# Patient Record
Sex: Female | Born: 1935
Health system: Southern US, Community
[De-identification: ages and names within clinical notes are randomized; demographics above are authoritative.]

## PROBLEM LIST (undated history)

## (undated) DIAGNOSIS — M542 Cervicalgia: Secondary | ICD-10-CM

## (undated) DIAGNOSIS — R519 Headache, unspecified: Secondary | ICD-10-CM

## (undated) DIAGNOSIS — I10 Essential (primary) hypertension: Secondary | ICD-10-CM

## (undated) DIAGNOSIS — I499 Cardiac arrhythmia, unspecified: Secondary | ICD-10-CM

## (undated) DIAGNOSIS — K219 Gastro-esophageal reflux disease without esophagitis: Secondary | ICD-10-CM

## (undated) DIAGNOSIS — M25512 Pain in left shoulder: Secondary | ICD-10-CM

## (undated) HISTORY — PX: TUBAL LIGATION: SHX77

## (undated) HISTORY — PX: ABDOMINAL HYSTERECTOMY: SHX81

---

## 1991-09-14 HISTORY — PX: TOTAL VAGINAL HYSTERECTOMY: SHX2548

## 2008-05-30 ENCOUNTER — Ambulatory Visit: Payer: Self-pay | Admitting: Internal Medicine

## 2010-12-07 ENCOUNTER — Ambulatory Visit: Payer: Self-pay | Admitting: Internal Medicine

## 2011-08-23 ENCOUNTER — Ambulatory Visit: Payer: Self-pay

## 2011-11-16 ENCOUNTER — Ambulatory Visit: Payer: Self-pay | Admitting: Internal Medicine

## 2011-11-29 ENCOUNTER — Ambulatory Visit: Payer: Self-pay | Admitting: Internal Medicine

## 2013-01-04 ENCOUNTER — Ambulatory Visit: Payer: Self-pay | Admitting: Internal Medicine

## 2013-01-04 LAB — TSH: TSH: 1.42 u[IU]/mL (ref <=5.90)

## 2013-03-15 ENCOUNTER — Ambulatory Visit: Payer: Self-pay | Admitting: Medical

## 2013-10-11 ENCOUNTER — Ambulatory Visit: Payer: Self-pay

## 2014-01-11 LAB — HM MAMMOGRAPHY: HM Mammogram: NORMAL

## 2014-01-22 ENCOUNTER — Ambulatory Visit: Payer: Self-pay | Admitting: Emergency Medicine

## 2014-01-22 LAB — COMPREHENSIVE METABOLIC PANEL
Albumin: 4.3 g/dL (ref 3.4–5.0)
Alkaline Phosphatase: 61 U/L
Anion Gap: 12 (ref 7–16)
BUN: 14 mg/dL (ref 7–18)
Bilirubin,Total: 0.4 mg/dL (ref 0.2–1.0)
Calcium, Total: 9 mg/dL (ref 8.5–10.1)
Chloride: 103 mmol/L (ref 98–107)
Co2: 26 mmol/L (ref 21–32)
Creatinine: 0.6 mg/dL (ref 0.60–1.30)
EGFR (African American): >60
EGFR (Non-African Amer.): >60
Glucose: 107 mg/dL — ABNORMAL HIGH (ref 65–99)
Osmolality: 282 (ref 275–301)
Potassium: 3.8 mmol/L (ref 3.5–5.1)
SGOT(AST): 24 U/L (ref 15–37)
SGPT (ALT): 28 U/L (ref 12–78)
Sodium: 141 mmol/L (ref 136–145)
Total Protein: 8.3 g/dL — ABNORMAL HIGH (ref 6.4–8.2)

## 2014-01-22 LAB — CBC WITH DIFFERENTIAL/PLATELET
Basophil #: 0 10*3/uL (ref 0.0–0.1)
Basophil %: 0.7 %
Eosinophil #: 0 10*3/uL (ref 0.0–0.7)
Eosinophil %: 0.5 %
HCT: 39.7 % (ref 35.0–47.0)
HGB: 13.4 g/dL (ref 12.0–16.0)
Lymphocyte #: 1.6 10*3/uL (ref 1.0–3.6)
Lymphocyte %: 27.6 %
MCH: 31.6 pg (ref 26.0–34.0)
MCHC: 33.8 g/dL (ref 32.0–36.0)
MCV: 93 fL (ref 80–100)
Monocyte #: 0.7 x10 3/mm (ref 0.2–0.9)
Monocyte %: 11.5 %
Neutrophil #: 3.6 10*3/uL (ref 1.4–6.5)
Neutrophil %: 59.7 %
Platelet: 314 10*3/uL (ref 150–440)
RBC: 4.25 10*6/uL (ref 3.80–5.20)
RDW: 13.1 % (ref 11.5–14.5)
WBC: 5.9 10*3/uL (ref 3.6–11.0)

## 2014-01-30 ENCOUNTER — Ambulatory Visit: Payer: Self-pay | Admitting: Internal Medicine

## 2014-05-11 ENCOUNTER — Ambulatory Visit: Payer: Self-pay | Admitting: Internal Medicine

## 2014-10-14 HISTORY — PX: ESOPHAGOGASTRODUODENOSCOPY (EGD) WITH ESOPHAGEAL DILATION: SHX5812

## 2014-10-17 ENCOUNTER — Ambulatory Visit: Payer: Self-pay | Admitting: Physician Assistant

## 2014-11-11 ENCOUNTER — Ambulatory Visit: Payer: Self-pay | Admitting: Gastroenterology

## 2014-12-04 ENCOUNTER — Ambulatory Visit: Payer: Self-pay | Admitting: Family Medicine

## 2014-12-04 LAB — URINALYSIS, COMPLETE
Bilirubin,UR: NEGATIVE
Glucose,UR: NEGATIVE
Ketone: NEGATIVE
LEUKOCYTE ESTERASE: NEGATIVE
NITRITE: NEGATIVE
PH: 8.5 (ref 5.0–8.0)
Protein: NEGATIVE
Specific Gravity: 1.02 (ref 1.000–1.030)
Squamous Epithelial: NONE SEEN

## 2014-12-06 LAB — URINE CULTURE

## 2015-01-02 ENCOUNTER — Ambulatory Visit
Admit: 2015-01-02 | Disposition: A | Discharge status: Home / Self Care | Payer: Self-pay | Attending: Family Medicine | Admitting: Family Medicine

## 2015-01-21 ENCOUNTER — Encounter: Payer: Self-pay | Admitting: Internal Medicine

## 2015-01-21 DIAGNOSIS — D171 Benign lipomatous neoplasm of skin and subcutaneous tissue of trunk: Secondary | ICD-10-CM | POA: Insufficient documentation

## 2015-01-21 DIAGNOSIS — M2040 Other hammer toe(s) (acquired), unspecified foot: Secondary | ICD-10-CM | POA: Insufficient documentation

## 2015-01-21 DIAGNOSIS — D179 Benign lipomatous neoplasm, unspecified: Secondary | ICD-10-CM | POA: Insufficient documentation

## 2015-01-21 DIAGNOSIS — K222 Esophageal obstruction: Secondary | ICD-10-CM | POA: Insufficient documentation

## 2015-01-21 DIAGNOSIS — S39012A Strain of muscle, fascia and tendon of lower back, initial encounter: Secondary | ICD-10-CM | POA: Insufficient documentation

## 2015-01-21 DIAGNOSIS — R1312 Dysphagia, oropharyngeal phase: Secondary | ICD-10-CM | POA: Insufficient documentation

## 2015-01-21 DIAGNOSIS — I10 Essential (primary) hypertension: Secondary | ICD-10-CM | POA: Insufficient documentation

## 2015-03-09 ENCOUNTER — Ambulatory Visit
Admission: EM | Admit: 2015-03-09 | Discharge: 2015-03-09 | Disposition: A | Discharge status: Home / Self Care | Payer: Medicare Other | Attending: Internal Medicine | Admitting: Internal Medicine

## 2015-03-09 ENCOUNTER — Encounter: Payer: Self-pay | Admitting: Emergency Medicine

## 2015-03-09 DIAGNOSIS — M1611 Unilateral primary osteoarthritis, right hip: Secondary | ICD-10-CM

## 2015-03-09 HISTORY — DX: Gastro-esophageal reflux disease without esophagitis: K21.9

## 2015-03-09 HISTORY — DX: Essential (primary) hypertension: I10

## 2015-03-09 MED ORDER — MELOXICAM 15 MG PO TABS: 15.0000 mg | ORAL_TABLET | Freq: Every day | ORAL | Status: DC

## 2015-03-09 NOTE — ED Notes (Signed)
Left hip pain since 2014. Recently seen doctor in May and Dr stated she had a pulled muscle and was put on Naproxen.  Pain is no better

## 2015-03-09 NOTE — Discharge Instructions (Signed)

## 2015-03-09 NOTE — ED Provider Notes (Signed)
CSN: [id number]     Arrival date & time 03/09/15  1515 History   First MD Initiated Contact with Patient 03/09/15 1516     Chief Complaint  Patient presents with  . Hip Pain   HPI Comments: On naproxen by her PCP but not helping much  Patient is a 80 y.o. female presenting with hip pain. The history is provided by the patient and a significant other. No language interpreter was used.  Hip Pain This is a chronic problem. The current episode started more than 1 week ago. The problem occurs constantly. The problem has been gradually worsening. Pertinent negatives include no chest pain, no abdominal pain, no headaches and no shortness of breath. The symptoms are aggravated by bending, twisting and walking. The symptoms are relieved by rest and sleep. She has tried acetaminophen for the symptoms. The treatment provided mild relief.   Past Medical History  Diagnosis Date  . Hypertension   . GERD (gastroesophageal reflux disease)    Past Surgical History  Procedure Laterality Date  . Total vaginal hysterectomy  1993  . Esophagogastroduodenoscopy (egd) with esophageal dilation  10/2014    Dr. Allen Norris  . Abdominal hysterectomy     Family History  Problem Relation Age of Onset  . Breast cancer Daughter    History  Substance Use Topics  . Smoking status: Never Smoker   . Smokeless tobacco: Never Used  . Alcohol Use: 0.0 oz/week    0 Standard drinks or equivalent per week   OB History    No data available     Review of Systems  HENT: Negative for congestion, dental problem, ear discharge, ear pain, nosebleeds, sinus pressure, sore throat and trouble swallowing.   Eyes: Negative for pain, discharge and redness.  Respiratory: Negative for cough, chest tightness, shortness of breath and wheezing.   Cardiovascular: Negative for chest pain and palpitations.  Gastrointestinal: Negative for nausea, vomiting, abdominal pain, diarrhea, constipation and abdominal distention.  Endocrine: Negative  for cold intolerance and heat intolerance.  Genitourinary: Negative for hematuria, flank pain and difficulty urinating.  Musculoskeletal: Positive for gait problem (lt hip pain). Negative for joint swelling, arthralgias, neck pain and neck stiffness.  Skin: Negative for color change, rash and wound.  Neurological: Negative for dizziness, tremors, seizures, syncope, speech difficulty, weakness, light-headedness, numbness and headaches.  Hematological: Does not bruise/bleed easily.  Psychiatric/Behavioral: Negative for behavioral problems, confusion, sleep disturbance and agitation. The patient is not nervous/anxious and is not hyperactive.     Allergies  Review of patient's allergies indicates no known allergies.  Home Medications   Prior to Admission medications   Medication Sig Start Date End Date Taking? Authorizing Provider  amLODipine (NORVASC) 10 MG tablet Take 1 tablet by mouth daily. 01/17/15  Yes Historical Provider, MD  Naproxen 375 MG TBEC Take 1 tablet by mouth 2 (two) times daily. 01/17/15  Yes Historical Provider, MD  Omeprazole 20 MG TBEC Take 1 tablet by mouth daily. 01/17/15  Yes Historical Provider, MD   BP 179/77 mmHg  Pulse 90  Temp(Src) 97.3 F (36.3 C) (Tympanic)  Ht 5\' 7"  (1.702 m)  Wt 139 lb (63.05 kg)  BMI 21.77 kg/m2  SpO2 99% Physical Exam  Constitutional: She is oriented to person, place, and time. She appears well-developed and well-nourished.  HENT:  Head: Normocephalic and atraumatic.  Eyes: EOM are normal. Pupils are equal, round, and reactive to light.  Neck: Normal range of motion. Neck supple.  Cardiovascular: Normal rate, regular rhythm and  normal heart sounds.  Exam reveals no gallop and no friction rub.   No murmur heard. Pulmonary/Chest: Effort normal and breath sounds normal. No respiratory distress. She has no wheezes. She has no rales. She exhibits no tenderness.  Abdominal: Soft. Bowel sounds are normal. She exhibits no distension and no mass.  There is no tenderness. There is no rebound and no guarding.  Musculoskeletal: Normal range of motion. She exhibits no edema.       Right hip: She exhibits tenderness.       Legs: Deep tenderness  Neurological: She is oriented to person, place, and time. No cranial nerve deficit.  Skin: Skin is warm and dry.  Psychiatric: She has a normal mood and affect. Her behavior is normal. Judgment and thought content normal.  Nursing note and vitals reviewed.  ED Course  Procedures (including critical care time) Labs Review Labs Reviewed - No data to display  Imaging Review No results found.   MDM  No diagnosis found. Hip Osteoarthritis: will change it to meloxicam and recommend ortho f/up - needs hip x-ray and may be joint injection.    Max Sane, MD 03/09/15 1600

## 2015-12-26 ENCOUNTER — Other Ambulatory Visit: Payer: Self-pay | Admitting: Internal Medicine

## 2015-12-29 NOTE — Telephone Encounter (Signed)
pts coming in on 7/26 @ 930

## 2016-03-20 ENCOUNTER — Ambulatory Visit
Admission: EM | Admit: 2016-03-20 | Discharge: 2016-03-20 | Disposition: A | Discharge status: Other Acute Inpatient Hospital | Payer: Medicaid Other | Attending: Family Medicine | Admitting: Family Medicine

## 2016-03-20 ENCOUNTER — Emergency Department
Admission: EM | Admit: 2016-03-20 | Discharge: 2016-03-20 | Disposition: A | Discharge status: Home / Self Care | Payer: Medicare Other | Attending: Emergency Medicine | Admitting: Emergency Medicine

## 2016-03-20 ENCOUNTER — Emergency Department: Payer: Medicare Other

## 2016-03-20 ENCOUNTER — Encounter: Payer: Self-pay | Admitting: Emergency Medicine

## 2016-03-20 DIAGNOSIS — Z7951 Long term (current) use of inhaled steroids: Secondary | ICD-10-CM | POA: Diagnosis not present

## 2016-03-20 DIAGNOSIS — R531 Weakness: Secondary | ICD-10-CM | POA: Insufficient documentation

## 2016-03-20 DIAGNOSIS — Z791 Long term (current) use of non-steroidal anti-inflammatories (NSAID): Secondary | ICD-10-CM | POA: Insufficient documentation

## 2016-03-20 DIAGNOSIS — R000 Tachycardia, unspecified: Secondary | ICD-10-CM | POA: Insufficient documentation

## 2016-03-20 DIAGNOSIS — Z79899 Other long term (current) drug therapy: Secondary | ICD-10-CM | POA: Insufficient documentation

## 2016-03-20 DIAGNOSIS — K219 Gastro-esophageal reflux disease without esophagitis: Secondary | ICD-10-CM | POA: Diagnosis not present

## 2016-03-20 DIAGNOSIS — R9431 Abnormal electrocardiogram [ECG] [EKG]: Secondary | ICD-10-CM | POA: Diagnosis not present

## 2016-03-20 DIAGNOSIS — R5383 Other fatigue: Secondary | ICD-10-CM | POA: Insufficient documentation

## 2016-03-20 DIAGNOSIS — Z8719 Personal history of other diseases of the digestive system: Secondary | ICD-10-CM | POA: Diagnosis not present

## 2016-03-20 DIAGNOSIS — I10 Essential (primary) hypertension: Secondary | ICD-10-CM | POA: Diagnosis not present

## 2016-03-20 DIAGNOSIS — R6889 Other general symptoms and signs: Secondary | ICD-10-CM | POA: Diagnosis not present

## 2016-03-20 LAB — URINALYSIS COMPLETE WITH MICROSCOPIC (ARMC ONLY)
BACTERIA UA: NONE SEEN
BILIRUBIN URINE: NEGATIVE
BILIRUBIN URINE: NEGATIVE
GLUCOSE, UA: NEGATIVE mg/dL
Glucose, UA: NEGATIVE mg/dL
KETONES UR: NEGATIVE mg/dL
Ketones, ur: NEGATIVE mg/dL
LEUKOCYTES UA: NEGATIVE
LEUKOCYTES UA: NEGATIVE
NITRITE: NEGATIVE
NITRITE: NEGATIVE
PH: 7 (ref 5.0–8.0)
Protein, ur: NEGATIVE mg/dL
Protein, ur: NEGATIVE mg/dL
SPECIFIC GRAVITY, URINE: 1.005 (ref 1.005–1.030)
Specific Gravity, Urine: 1.015 (ref 1.005–1.030)
Squamous Epithelial / LPF: NONE SEEN
WBC UA: NONE SEEN WBC/hpf (ref 0–5)
pH: 7 (ref 5.0–8.0)

## 2016-03-20 LAB — TSH: TSH: 0.774 u[IU]/mL (ref 0.350–4.500)

## 2016-03-20 LAB — COMPREHENSIVE METABOLIC PANEL
ALT: 19 U/L (ref 14–54)
ANION GAP: 11 (ref 5–15)
AST: 27 U/L (ref 15–41)
Albumin: 4.4 g/dL (ref 3.5–5.0)
Alkaline Phosphatase: 56 U/L (ref 38–126)
BUN: 11 mg/dL (ref 6–20)
CALCIUM: 9.2 mg/dL (ref 8.9–10.3)
CHLORIDE: 107 mmol/L (ref 101–111)
CO2: 24 mmol/L (ref 22–32)
CREATININE: 0.54 mg/dL (ref 0.44–1.00)
Glucose, Bld: 113 mg/dL — ABNORMAL HIGH (ref 65–99)
POTASSIUM: 3.4 mmol/L — AB (ref 3.5–5.1)
Sodium: 142 mmol/L (ref 135–145)
TOTAL PROTEIN: 7.6 g/dL (ref 6.5–8.1)
Total Bilirubin: 0.4 mg/dL (ref 0.3–1.2)

## 2016-03-20 LAB — TROPONIN I

## 2016-03-20 LAB — DIFFERENTIAL
Basophils Absolute: 0 10*3/uL (ref 0–0.1)
Basophils Relative: 1 %
EOS PCT: 1 %
Eosinophils Absolute: 0.1 10*3/uL (ref 0–0.7)
LYMPHS ABS: 1.9 10*3/uL (ref 1.0–3.6)
LYMPHS PCT: 30 %
MONO ABS: 0.8 10*3/uL (ref 0.2–0.9)
MONOS PCT: 13 %
Neutro Abs: 3.5 10*3/uL (ref 1.4–6.5)
Neutrophils Relative %: 55 %

## 2016-03-20 LAB — CBC
HCT: 42.8 % (ref 35.0–47.0)
Hemoglobin: 14.5 g/dL (ref 12.0–16.0)
MCH: 31.5 pg (ref 26.0–34.0)
MCHC: 33.7 g/dL (ref 32.0–36.0)
MCV: 93.5 fL (ref 80.0–100.0)
PLATELETS: 324 10*3/uL (ref 150–440)
RBC: 4.58 MIL/uL (ref 3.80–5.20)
RDW: 13 % (ref 11.5–14.5)
WBC: 6.1 10*3/uL (ref 3.6–11.0)

## 2016-03-20 LAB — LACTIC ACID, PLASMA: Lactic Acid, Venous: 1 mmol/L (ref 0.5–1.9)

## 2016-03-20 LAB — BRAIN NATRIURETIC PEPTIDE: B Natriuretic Peptide: 32 pg/mL (ref 0.0–100.0)

## 2016-03-20 MED ORDER — ASPIRIN 81 MG PO CHEW
324.0000 mg | CHEWABLE_TABLET | Freq: Once | ORAL | Status: AC
  Administered 2016-03-20: 324 mg via ORAL

## 2016-03-20 NOTE — ED Notes (Signed)
Patient complains of feeling tired/ run down for the last three weeks. Patient states that she has no other complaints. Patient denies recent sickness, tick bite, or chest pain.

## 2016-03-20 NOTE — ED Provider Notes (Signed)
[facility] Emergency Department Provider Note   ____________________________________________  Time seen: Approximately 2:14 PM  I have reviewed the triage vital signs and the nursing notes.   HISTORY  Chief Complaint Weakness    HPI Kendra Lane is a 81 y.o. female patient sent from urgent care with 2 or 3 week history of weakness. Patient denies nausea vomiting shortness of breath chest pain fever chills diarrhea or any other complaints she has no urgency frequency or dysuria. She does not feel depressed. Patient had frequent premature atrial contractions on EKG.   Past Medical History  Diagnosis Date  . Hypertension   . GERD (gastroesophageal reflux disease)     Patient Active Problem List   Diagnosis Date Noted  . Benign esophageal stricture 01/21/2015  . Essential (primary) hypertension 01/21/2015  . Hammer toe 01/21/2015  . Fatty tumor 01/21/2015  . Dysphagia, oropharyngeal 01/21/2015  . Low back strain 01/21/2015    Past Surgical History  Procedure Laterality Date  . Total vaginal hysterectomy  1993  . Esophagogastroduodenoscopy (egd) with esophageal dilation  10/2014    Dr. Allen Norris  . Abdominal hysterectomy      Current Outpatient Rx  Name  Route  Sig  Dispense  Refill  . amLODipine (NORVASC) 10 MG tablet      TAKE 1 TABLET BY MOUTH DAILY   90 tablet   0   . fluticasone (FLONASE) 50 MCG/ACT nasal spray      SHAKE WELL AND USE 2 SPRAYS IN EACH NOSTRIL EVERY DAY   16 g   0   . meloxicam (MOBIC) 15 MG tablet   Oral   Take 1 tablet (15 mg total) by mouth daily.   30 tablet   0   . Naproxen 375 MG TBEC   Oral   Take 1 tablet by mouth 2 (two) times daily.         Marland Kitchen omeprazole (PRILOSEC) 20 MG capsule      TAKE ONE CAPSULE BY MOUTH EVERY DAY   90 capsule   0     Allergies Review of patient's allergies indicates no known allergies.  Family History  Problem Relation Age of Onset  . Breast cancer Daughter      Social History Social History  Substance Use Topics  . Smoking status: Never Smoker   . Smokeless tobacco: Never Used  . Alcohol Use: 0.0 oz/week    0 Standard drinks or equivalent per week     Comment: 1 beer daily    Review of Systems Constitutional: No fever/chills Eyes: No visual changes. ENT: No sore throat. Cardiovascular: Denies chest pain. Respiratory: Denies shortness of breath. Gastrointestinal: No abdominal pain.  No nausea, no vomiting.  No diarrhea.  No constipation. Genitourinary: Negative for dysuria. Musculoskeletal: Negative for back pain. Skin: Negative for rash. Neurological: Negative for headaches, focal weakness or numbness.  10-point ROS otherwise negative.  ____________________________________________   PHYSICAL EXAM:  VITAL SIGNS: ED Triage Vitals  Enc Vitals Group     BP 03/20/16 1340 172/92 mmHg     Pulse Rate 03/20/16 1340 107     Resp 03/20/16 1340 16     Temp 03/20/16 1340 97.9 F (36.6 C)     Temp Source 03/20/16 1340 Oral     SpO2 03/20/16 1340 100 %     Weight 03/20/16 1340 138 lb (62.596 kg)     Height 03/20/16 1340 5\' 5"  (1.651 m)     Head Cir --  Peak Flow --      Pain Score 03/20/16 1343 0     Pain Loc --      Pain Edu? --      Excl. in [place]? --    Constitutional: Alert and oriented. Well appearing and in no acute distress. Eyes: Conjunctivae are normal. PERRL. EOMI. Head: Atraumatic. Nose: No congestion/rhinnorhea. Mouth/Throat: Mucous membranes are moist.  Oropharynx non-erythematous. Neck: No stridor.  Cardiovascular: Tachycardia at about 106, regular rhythm. Grossly normal heart sounds.  Good peripheral circulation. Respiratory: Normal respiratory effort.  No retractions. Lungs CTAB. Gastrointestinal: Soft and nontender. No distention. No abdominal bruits. No CVA tenderness. Musculoskeletal: No lower extremity tenderness nor edema.  No joint effusions. Neurologic:  Normal speech and language. No gross focal  neurologic deficits are appreciated. No gait instability. Skin:  Skin is warm, dry and intact. No rash noted. Psychiatric: Mood and affect are normal. Speech and behavior are normal.  ____________________________________________   LABS (all labs ordered are listed, but only abnormal results are displayed)  Labs Reviewed  COMPREHENSIVE METABOLIC PANEL - Abnormal; Notable for the following:    Potassium 3.4 (*)    Glucose, Bld 113 (*)    All other components within normal limits  URINALYSIS COMPLETEWITH MICROSCOPIC ([facility] ONLY) - Abnormal; Notable for the following:    Color, Urine COLORLESS (*)    APPearance CLEAR (*)    Hgb urine dipstick 1+ (*)    Bacteria, UA RARE (*)    All other components within normal limits  TROPONIN I  CBC  BRAIN NATRIURETIC PEPTIDE  LACTIC ACID, PLASMA  TSH  DIFFERENTIAL  LACTIC ACID, PLASMA  CBC WITH DIFFERENTIAL/PLATELET   ____________________________________________  EKG  EKG read and interpreted by me shows sinus rate at a rate of 93 normal axis no acute ST-T wave changes there are occasional PACs on the EKG I should add that the monitor showed almost bigeminy with the PACs ____________________________________________  RADIOLOGY  No acute changes changes per radiology ____________________________________________   PROCEDURES   Procedures    ____________________________________________   INITIAL IMPRESSION / ASSESSMENT AND PLAN / ED COURSE  Pertinent labs & imaging results that were available during my care of the patient were reviewed by me and considered in my medical decision making (see chart for details).   ____________________________________________   FINAL CLINICAL IMPRESSION(S) / ED DIAGNOSES  Final diagnoses:  Weakness      NEW MEDICATIONS STARTED DURING THIS VISIT:  New Prescriptions   No medications on file     Note:  This document was prepared using Dragon voice recognition software and may include  unintentional dictation errors.    Nena Polio, MD 03/20/16 1630

## 2016-03-20 NOTE — Discharge Instructions (Signed)
Weakness Weakness is a lack of strength. It may be felt all over the body (generalized) or in one specific part of the body (focal). Some causes of weakness can be serious. You may need further medical evaluation, especially if you are elderly or you have a history of immunosuppression (such as chemotherapy or HIV), kidney disease, heart disease, or diabetes. CAUSES  Weakness can be caused by many different things, including:  Infection.  Physical exhaustion.  Internal bleeding or other blood loss that results in a lack of red blood cells (anemia).  Dehydration. This cause is more common in elderly people.  Side effects or electrolyte abnormalities from medicines, such as pain medicines or sedatives.  Emotional distress, anxiety, or depression.  Circulation problems, especially severe peripheral arterial disease.  Heart disease, such as rapid atrial fibrillation, bradycardia, or heart failure.  Nervous system disorders, such as Guillain-Barr syndrome, multiple sclerosis, or stroke. DIAGNOSIS  To find the cause of your weakness, your caregiver will take your history and perform a physical exam. Lab tests or X-rays may also be ordered, if needed. TREATMENT  Treatment of weakness depends on the cause of your symptoms and can vary greatly. HOME CARE INSTRUCTIONS   Rest as needed.  Eat a well-balanced diet.  Try to get some exercise every day.  Only take over-the-counter or prescription medicines as directed by your caregiver. SEEK MEDICAL CARE IF:   Your weakness seems to be getting worse or spreads to other parts of your body.  You develop new aches or pains. SEEK IMMEDIATE MEDICAL CARE IF:   You cannot perform your normal daily activities, such as getting dressed and feeding yourself.  You cannot walk up and down stairs, or you feel exhausted when you do so.  You have shortness of breath or chest pain.  You have difficulty moving parts of your body.  You have weakness  in only one area of the body or on only one side of the body.  You have a fever.  You have trouble speaking or swallowing.  You cannot control your bladder or bowel movements.  You have black or bloody vomit or stools. MAKE SURE YOU:  Understand these instructions.  Will watch your condition.  Will get help right away if you are not doing well or get worse.   This information is not intended to replace advice given to you by your health care provider. Make sure you discuss any questions you have with your health care provider.   Document Released: 08/30/2005 Document Revised: 02/29/2012 Document Reviewed: 10/29/2011 Elsevier Interactive Patient Education [employer]. Use return if you're worse or develop any new symptoms. Please call your doctor schedule follow-up appointment on Monday.

## 2016-03-20 NOTE — ED Notes (Addendum)
Patient arrives to [facility] with complaint of weakness for two weeks. Denies injury or insect bite.

## 2016-03-20 NOTE — ED Provider Notes (Addendum)
CSN: [id number]     Arrival date & time 03/20/16  1108 History   First MD Initiated Contact with Patient 03/20/16 1143     Chief Complaint  Patient presents with  . Fatigue   (Consider location/radiation/quality/duration/timing/severity/associated sxs/prior Treatment) HPI: Patient presents today with generalized symptoms of fatigue. Patient states that she has been feeling this way for the last 3 weeks. She denies any other symptoms. She denies fever, chest pain, shortness of breath, headache, lower extremity edema or pain, abdominal pain, nausea, vomiting, diarrhea, dizziness, rash, urinary symptoms. She denies any recent travel. She denies any known cardiac problems besides hypertension. She has not started to take any new medications recently. She denies smoking or any other illicit drug use.  Past Medical History  Diagnosis Date  . Hypertension   . GERD (gastroesophageal reflux disease)    Past Surgical History  Procedure Laterality Date  . Total vaginal hysterectomy  1993  . Esophagogastroduodenoscopy (egd) with esophageal dilation  10/2014    Dr. Allen Norris  . Abdominal hysterectomy     Family History  Problem Relation Age of Onset  . Breast cancer Daughter    Social History  Substance Use Topics  . Smoking status: Never Smoker   . Smokeless tobacco: Never Used  . Alcohol Use: 0.0 oz/week    0 Standard drinks or equivalent per week     Comment: 1 beer daily   OB History    No data available     Review of Systems: Negative except mentioned above.  Allergies  Review of patient's allergies indicates no known allergies.  Home Medications   Prior to Admission medications   Medication Sig Start Date End Date Taking? Authorizing Provider  amLODipine (NORVASC) 10 MG tablet TAKE 1 TABLET BY MOUTH DAILY 12/26/15  Yes Glean Hess, MD  fluticasone [facility]) 50 MCG/ACT nasal spray SHAKE WELL AND USE 2 SPRAYS IN EACH NOSTRIL EVERY DAY 12/26/15  Yes Glean Hess, MD  omeprazole  (PRILOSEC) 20 MG capsule TAKE ONE CAPSULE BY MOUTH EVERY DAY 12/26/15  Yes Glean Hess, MD  meloxicam (MOBIC) 15 MG tablet Take 1 tablet (15 mg total) by mouth daily. 03/09/15   Max Sane, MD  Naproxen 375 MG TBEC Take 1 tablet by mouth 2 (two) times daily. 01/17/15   Historical Provider, MD   Meds Ordered and Administered this Visit  Medications - No data to display  BP 159/73 mmHg  Pulse 44  Temp(Src) 98.2 F (36.8 C) (Oral)  Resp 17  Ht 5\' 6"  (1.676 m)  Wt 136 lb (61.689 kg)  BMI 21.96 kg/m2  SpO2 100% No data found.   Physical Exam   GENERAL: NAD HEENT: no pharyngeal erythema, no exudate RESP: CTA B CARD: irregular rate and rhythm  ABD: +BS, NT  EXTREM: -[place] NEURO: AAO, CN II-XII grossly intact   ED Course  Procedures (including critical care time)  Labs Review Labs Reviewed  URINALYSIS COMPLETEWITH MICROSCOPIC ([place]) - Abnormal; Notable for the following:    Hgb urine dipstick TRACE (*)    Squamous Epithelial / LPF 0-5 (*)    All other components within normal limits    Imaging Review No results found.  ECG- HR 93, undetermined rhythm, nonspecific ST abnormaility, p waves appreciated, agree with print out reading of ECG  Previous ECG 2012 - NSR, no other abnormalities   MDM  A/P: Generalized fatigue, abnormal EKG- patient does not have a regular rate and rhythm on exam, ?PACs. I would  recommend further evaluation and treatment at the ER given patient's age and symptoms. Patient was given 4 baby aspirin here in the office, IV Hep-Lock, 2 L O2 nasal cannula. EMS to take patient to ER for further evaluation and treatment.    Paulina Fusi, MD 03/20/16 [id number]  Paulina Fusi, MD 03/20/16 [id number]  Paulina Fusi, MD 03/22/16 1125

## 2016-03-20 NOTE — ED Notes (Signed)
Signature pad not working.  Pt verbalized understanding of discharge instructions and has no further questions.

## 2016-03-29 ENCOUNTER — Other Ambulatory Visit: Payer: Self-pay | Admitting: Internal Medicine

## 2016-04-07 ENCOUNTER — Ambulatory Visit (INDEPENDENT_AMBULATORY_CARE_PROVIDER_SITE_OTHER): Payer: Medicare Other | Admitting: Internal Medicine

## 2016-04-07 ENCOUNTER — Encounter: Payer: Self-pay | Admitting: Internal Medicine

## 2016-04-07 VITALS — BP 142/80 | HR 94 | Resp 16 | Ht 68.25 in | Wt 133.8 lb

## 2016-04-07 DIAGNOSIS — Z23 Encounter for immunization: Secondary | ICD-10-CM

## 2016-04-07 DIAGNOSIS — Z1231 Encounter for screening mammogram for malignant neoplasm of breast: Secondary | ICD-10-CM

## 2016-04-07 DIAGNOSIS — Z0000 Encounter for general adult medical examination without abnormal findings: Secondary | ICD-10-CM

## 2016-04-07 DIAGNOSIS — I10 Essential (primary) hypertension: Secondary | ICD-10-CM | POA: Diagnosis not present

## 2016-04-07 DIAGNOSIS — I499 Cardiac arrhythmia, unspecified: Secondary | ICD-10-CM | POA: Diagnosis not present

## 2016-04-07 DIAGNOSIS — K222 Esophageal obstruction: Secondary | ICD-10-CM | POA: Diagnosis not present

## 2016-04-07 DIAGNOSIS — I498 Other specified cardiac arrhythmias: Secondary | ICD-10-CM | POA: Insufficient documentation

## 2016-04-07 LAB — POCT URINALYSIS DIPSTICK
BILIRUBIN UA: NEGATIVE
Blood, UA: NEGATIVE
Glucose, UA: NEGATIVE
KETONES UA: NEGATIVE
LEUKOCYTES UA: NEGATIVE
Nitrite, UA: NEGATIVE
PH UA: 7
PROTEIN UA: NEGATIVE
SPEC GRAV UA: 1.01

## 2016-04-07 MED ORDER — AMLODIPINE BESYLATE 10 MG PO TABS: 10.0000 mg | ORAL_TABLET | Freq: Every day | ORAL | 3 refills | Status: DC

## 2016-04-07 MED ORDER — OMEPRAZOLE 20 MG PO CPDR: 20.0000 mg | DELAYED_RELEASE_CAPSULE | Freq: Every day | ORAL | 3 refills | Status: DC

## 2016-04-07 NOTE — Patient Instructions (Addendum)
Health Maintenance  Topic Date Due  . ZOSTAVAX  08/25/1997  . DEXA SCAN  08/25/2002  . PNA vac Low Risk Adult (2 of 2 - PCV13) 06/26/2013  . MAMMOGRAM  01/12/2015  . TETANUS/TDAP  09/13/2017 (Originally 08/25/1954)  . INFLUENZA VACCINE  04/13/2016    Breast Self-Awareness Practicing breast self-awareness may pick up problems early, prevent significant medical complications, and possibly save your life. By practicing breast self-awareness, you can become familiar with how your breasts look and feel and if your breasts are changing. This allows you to notice changes early. It can also offer you some reassurance that your breast health is good. One way to learn what is normal for your breasts and whether your breasts are changing is to do a breast self-exam. If you find a lump or something that was not present in the past, it is best to contact your caregiver right away. Other findings that should be evaluated by your caregiver include nipple discharge, especially if it is bloody; skin changes or reddening; areas where the skin seems to be pulled in (retracted); or new lumps and bumps. Breast pain is seldom associated with cancer (malignancy), but should also be evaluated by a caregiver. HOW TO PERFORM A BREAST SELF-EXAM The best time to examine your breasts is 5-7 days after your menstrual period is over. During menstruation, the breasts are lumpier, and it may be more difficult to pick up changes. If you do not menstruate, have reached menopause, or had your uterus removed (hysterectomy), you should examine your breasts at regular intervals, such as monthly. If you are breastfeeding, examine your breasts after a feeding or after using a breast pump. Breast implants do not decrease the risk for lumps or tumors, so continue to perform breast self-exams as recommended. Talk to your caregiver about how to determine the difference between the implant and breast tissue. Also, talk about the amount of pressure  you should use during the exam. Over time, you will become more familiar with the variations of your breasts and more comfortable with the exam. A breast self-exam requires you to remove all your clothes above the waist. 1. Look at your breasts and nipples. Stand in front of a mirror in a room with good lighting. With your hands on your hips, push your hands firmly downward. Look for a difference in shape, contour, and size from one breast to the other (asymmetry). Asymmetry includes puckers, dips, or bumps. Also, look for skin changes, such as reddened or scaly areas on the breasts. Look for nipple changes, such as discharge, dimpling, repositioning, or redness. 2. Carefully feel your breasts. This is best done either in the shower or tub while using soapy water or when flat on your back. Place the arm (on the side of the breast you are examining) above your head. Use the pads (not the fingertips) of your three middle fingers on your opposite hand to feel your breasts. Start in the underarm area and use  inch (2 cm) overlapping circles to feel your breast. Use 3 different levels of pressure (light, medium, and firm pressure) at each circle before moving to the next circle. The light pressure is needed to feel the tissue closest to the skin. The medium pressure will help to feel breast tissue a little deeper, while the firm pressure is needed to feel the tissue close to the ribs. Continue the overlapping circles, moving downward over the breast until you feel your ribs below your breast. Then, move one  finger-width towards the center of the body. Continue to use the  inch (2 cm) overlapping circles to feel your breast as you move slowly up toward the collar bone (clavicle) near the base of the neck. Continue the up and down exam using all 3 pressures until you reach the middle of the chest. Do this with each breast, carefully feeling for lumps or changes. 3.  Keep a written record with breast changes or normal  findings for each breast. By writing this information down, you do not need to depend only on memory for size, tenderness, or location. Write down where you are in your menstrual cycle, if you are still menstruating. Breast tissue can have some lumps or thick tissue. However, see your caregiver if you find anything that concerns you.  SEEK MEDICAL CARE IF:  You see a change in shape, contour, or size of your breasts or nipples.   You see skin changes, such as reddened or scaly areas on the breasts or nipples.   You have an unusual discharge from your nipples.   You feel a new lump or unusually thick areas.    This information is not intended to replace advice given to you by your health care provider. Make sure you discuss any questions you have with your health care provider.   Document Released: 08/30/2005 Document Revised: 08/16/2012 Document Reviewed: 12/15/2011 Elsevier Interactive Patient Education 2016 Elsevier Inc. Pneumococcal Conjugate Vaccine (PCV13)  1. Why get vaccinated? Vaccination can protect both children and adults from pneumococcal disease. Pneumococcal disease is caused by bacteria that can spread from person to person through close contact. It can cause ear infections, and it can also lead to more serious infections of the:  Lungs (pneumonia),  Blood (bacteremia), and  Covering of the brain and spinal cord (meningitis). Pneumococcal pneumonia is most common among adults. Pneumococcal meningitis can cause deafness and brain damage, and it kills about 1 child in 10 who get it. Anyone can get pneumococcal disease, but children under 22 years of age and adults 68 years and older, people with certain medical conditions, and cigarette smokers are at the highest risk. Before there was a vaccine, the [place] saw:  more than 700 cases of meningitis,  about 13,000 blood infections,  about 5 million ear infections, and  about 200 deaths in children under 5  each year from pneumococcal disease. Since vaccine became available, severe pneumococcal disease in these children has fallen by 88%. About 18,000 older adults die of pneumococcal disease each year in the [place]. Treatment of pneumococcal infections with penicillin and other drugs is not as effective as it used to be, because some strains of the disease have become resistant to these drugs. This makes prevention of the disease, through vaccination, even more important. 2. PCV13 vaccine Pneumococcal conjugate vaccine (called PCV13) protects against 13 types of pneumococcal bacteria. PCV13 is routinely given to children at 2, 4, 6, and 13-66 months of age. It is also recommended for children and adults 28 to 25 years of age with certain health conditions, and for all adults 48 years of age and older. Your doctor can give you details. 3. Some people should not get this vaccine Anyone who has ever had a life-threatening allergic reaction to a dose of this vaccine, to an earlier pneumococcal vaccine called PCV7, or to any vaccine containing diphtheria toxoid (for example, DTaP), should not get PCV13. Anyone with a severe allergy to any component of PCV13 should not get the  vaccine. Tell your doctor if the person being vaccinated has any severe allergies. If the person scheduled for vaccination is not feeling well, your healthcare provider might decide to reschedule the shot on another day. 4. Risks of a vaccine reaction With any medicine, including vaccines, there is a chance of reactions. These are usually mild and go away on their own, but serious reactions are also possible. Problems reported following PCV13 varied by age and dose in the series. The most common problems reported among children were:  About half became drowsy after the shot, had a temporary loss of appetite, or had redness or tenderness where the shot was given.  About 1 out of 3 had swelling where the shot was given.  About 1  out of 3 had a mild fever, and about 1 in 20 had a fever over 102.45F.  Up to about 8 out of 10 became fussy or irritable. Adults have reported pain, redness, and swelling where the shot was given; also mild fever, fatigue, headache, chills, or muscle pain. Young children who get PCV13 along with inactivated flu vaccine at the same time may be at increased risk for seizures caused by fever. Ask your doctor for more information. Problems that could happen after any vaccine:  People sometimes faint after a medical procedure, including vaccination. Sitting or lying down for about 15 minutes can help prevent fainting, and injuries caused by a fall. Tell your doctor if you feel dizzy, or have vision changes or ringing in the ears.  Some older children and adults get severe pain in the shoulder and have difficulty moving the arm where a shot was given. This happens very rarely.  Any medication can cause a severe allergic reaction. Such reactions from a vaccine are very rare, estimated at about 1 in a million doses, and would happen within a few minutes to a few hours after the vaccination. As with any medicine, there is a very small chance of a vaccine causing a serious injury or death. The safety of vaccines is always being monitored. For more information, visit: http://www.aguilar.org/ 5. What if there is a serious reaction? What should I look for?  Look for anything that concerns you, such as signs of a severe allergic reaction, very high fever, or unusual behavior. Signs of a severe allergic reaction can include hives, swelling of the face and throat, difficulty breathing, a fast heartbeat, dizziness, and weakness-usually within a few minutes to a few hours after the vaccination. What should I do?  If you think it is a severe allergic reaction or other emergency that can't wait, call 9-1-1 or get the person to the nearest hospital. Otherwise, call your doctor. Reactions should be reported to  the Vaccine Adverse Event Reporting System (VAERS). Your doctor should file this report, or you can do it yourself through the VAERS web site at www.vaers.SamedayNews.es, or by calling 231-127-6792. VAERS does not give medical advice. 6. The National Vaccine Injury Compensation Program The [employer] Vaccine Injury Compensation Program (VICP) is a federal program that was created to compensate people who may have been injured by certain vaccines. Persons who believe they may have been injured by a vaccine can learn about the program and about filing a claim by calling 6062729740 or visiting the [place] website at GoldCloset.com.ee. There is a time limit to file a claim for compensation. 7. How can I learn more?  Ask your healthcare provider. He or she can give you the vaccine package insert or suggest other  sources of information.  Call your local or state health department.  Contact the Centers for Disease Control and Prevention (CDC):  Call 813-851-0938 (1-800-CDC-INFO) or  Visit CDC's website at http://hunter.com/ Vaccine Information Statement PCV13 Vaccine (07/18/2014)   This information is not intended to replace advice given to you by your health care provider. Make sure you discuss any questions you have with your health care provider.   Document Released: 06/27/2006 Document Revised: 09/20/2014 Document Reviewed: 07/25/2014 Elsevier Interactive Patient Education [employer].

## 2016-04-07 NOTE — Progress Notes (Signed)
Patient: Kendra Lane, Female    DOB: 1935/04/15, 81 y.o.   MRN: [id number] Visit Date: 04/07/2016  Today's Provider: Halina Maidens, MD   Chief Complaint  Patient presents with  . Medicare Wellness   Subjective:    Annual wellness visit Kendra Lane is a 81 y.o. female who presents today for her Subsequent Annual Wellness Visit. She feels well. She reports exercising walking. She reports she is sleeping fairly well. Denies breast problems and due for mammograms.  ----------------------------------------------------------- Seen in ER 2 months ago with fatigue.  Labs were normal.  EKG showed bigeminy with normal rate.  Patient does perceive some palpitations but no associated CP or SOB, no dizziness or decreased endurance.   Hypertension  This is a chronic problem. The current episode started more than 1 year ago. The problem is unchanged. The problem is controlled. Pertinent negatives include no chest pain, headaches, palpitations or shortness of breath.  Gastroesophageal Reflux  She reports no abdominal pain, no chest pain, no coughing, no dysphagia, no heartburn or no wheezing. The problem occurs rarely. The problem has been resolved. Pertinent negatives include no fatigue. She has tried a PPI (and esophageal dilatation) for the symptoms. The treatment provided significant relief.    Review of Systems  Constitutional: Negative for chills, fatigue and fever.  HENT: Negative for congestion, hearing loss, tinnitus, trouble swallowing and voice change.   Eyes: Negative for visual disturbance.  Respiratory: Negative for cough, chest tightness, shortness of breath and wheezing.   Cardiovascular: Negative for chest pain, palpitations and leg swelling.  Gastrointestinal: Negative for abdominal pain, constipation, diarrhea, dysphagia, heartburn and vomiting.  Endocrine: Negative for polydipsia and polyuria.  Genitourinary: Positive for urgency. Negative for dysuria, frequency,  genital sores, vaginal bleeding and vaginal discharge.  Musculoskeletal: Negative for arthralgias, gait problem and joint swelling.  Skin: Negative for color change and rash.  Neurological: Negative for dizziness, tremors, light-headedness and headaches.  Hematological: Negative for adenopathy. Does not bruise/bleed easily.  Psychiatric/Behavioral: Negative for dysphoric mood and sleep disturbance. The patient is not nervous/anxious.     Social History   Social History  . Marital status: Married    Spouse name: N/A  . Number of children: N/A  . Years of education: N/A   Occupational History  . Not on file.   Social History Main Topics  . Smoking status: Never Smoker  . Smokeless tobacco: Never Used  . Alcohol use 0.0 oz/week     Comment: 1 beer daily  . Drug use: No  . Sexual activity: Not on file   Other Topics Concern  . Not on file   Social History Narrative  . No narrative on file    Patient Active Problem List   Diagnosis Date Noted  . Bigeminy 04/07/2016  . Benign esophageal stricture 01/21/2015  . Essential (primary) hypertension 01/21/2015  . Hammer toe 01/21/2015  . Fatty tumor 01/21/2015  . Low back strain 01/21/2015    Past Surgical History:  Procedure Laterality Date  . ABDOMINAL HYSTERECTOMY    . ESOPHAGOGASTRODUODENOSCOPY (EGD) WITH ESOPHAGEAL DILATION  10/2014   Dr. Allen Norris  . TOTAL VAGINAL HYSTERECTOMY  1993    Her family history includes Breast cancer in her daughter.    Previous Medications   AMLODIPINE (NORVASC) 10 MG TABLET    TAKE 1 TABLET BY MOUTH DAILY   ASPIRIN 81 MG TABLET    Take 81 mg by mouth daily.   FLUTICASONE (FLONASE) 50 MCG/ACT NASAL SPRAY  SHAKE WELL AND USE 2 SPRAYS IN EACH NOSTRIL EVERY DAY   MULTIPLE VITAMINS-MINERALS (CENTRUM SILVER 50+WOMEN PO)    Take 1 tablet by mouth daily.   OMEPRAZOLE (PRILOSEC) 20 MG CAPSULE    TAKE 1 CAPSULE BY MOUTH EVERY DAY    Patient Care Team: Glean Hess, MD as PCP - General (Family  Medicine)     Objective:   Vitals: BP (!) 144/82 (BP Location: Right Arm, Patient Position: Sitting, Cuff Size: Small)   Pulse 94   Resp 16   Ht 5' 8.25" (1.734 m)   Wt 133 lb 12.8 oz (60.7 kg)   BMI 20.20 kg/m   Physical Exam  Constitutional: She is oriented to person, place, and time. She appears well-developed and well-nourished. No distress.  HENT:  Head: Normocephalic and atraumatic.  Right Ear: Tympanic membrane and ear canal normal.  Left Ear: Tympanic membrane and ear canal normal.  Nose: Right sinus exhibits no maxillary sinus tenderness. Left sinus exhibits no maxillary sinus tenderness.  Mouth/Throat: Uvula is midline and oropharynx is clear and moist.  Eyes: Conjunctivae and EOM are normal. Right eye exhibits no discharge. Left eye exhibits no discharge. No scleral icterus.  Neck: Normal range of motion. Neck supple. Carotid bruit is not present. No erythema present. No thyromegaly present.  Cardiovascular: Normal rate, regular rhythm, normal heart sounds, intact distal pulses and normal pulses.  Frequent extrasystoles are present.  Pulmonary/Chest: Effort normal and breath sounds normal. No respiratory distress. She has no wheezes. Right breast exhibits no mass, no nipple discharge, no skin change and no tenderness. Left breast exhibits no mass, no nipple discharge, no skin change and no tenderness.  Abdominal: Soft. Bowel sounds are normal. There is no hepatosplenomegaly. There is no tenderness. There is no CVA tenderness.  Musculoskeletal: She exhibits no edema.       Right hip: Normal.       Left hip: Normal.       Right knee: Normal.       Left knee: Normal.  Bunions bilateral great toes. Hammer toes 2nd, 3rd and 4th toes bilaterally Fungal nails on 2nd, 3rd and 4th toes nails on right foot  Lymphadenopathy:    She has no cervical adenopathy.    She has no axillary adenopathy.  Neurological: She is alert and oriented to person, place, and time. She has normal  reflexes. No cranial nerve deficit or sensory deficit.  Skin: Skin is warm, dry and intact. No rash noted.  Psychiatric: She has a normal mood and affect. Her speech is normal and behavior is normal. Thought content normal.  Nursing note and vitals reviewed.   Activities of Daily Living In your present state of health, do you have any difficulty performing the following activities: 04/07/2016  Hearing? N  Vision? N  Difficulty concentrating or making decisions? N  Walking or climbing stairs? N  Dressing or bathing? N  Doing errands, shopping? N  Preparing Food and eating ? N  Using the Toilet? N  In the past six months, have you accidently leaked urine? Y  Do you have problems with loss of bowel control? N  Managing your Medications? N  Managing your Finances? N  Housekeeping or managing your Housekeeping? N  Some recent data might be hidden    Fall Risk Assessment Fall Risk  04/07/2016  Falls in the past year? No      Depression Screen PHQ 2/9 Scores 04/07/2016  PHQ - 2 Score 0  Cognitive Testing - 6-CIT   Correct? Score   What year is it? yes 0 Yes = 0    No = 4  What month is it? yes 0 Yes = 0    No = 3  Remember:     [address]ia [address], [place], [place]     What time is it? yes 0 Yes = 0    No = 3  Count backwards from 20 to 1 yes 0 Correct = 0    1 error = 2   More than 1 error = 4  Say the months of the year in reverse. yes 0 Correct = 0    1 error = 2   More than 1 error = 4  What address did I ask you to remember? yes 3 Correct = 0  1 error = 2    2 error = 4    3 error = 6    4 error = 8    All wrong = 10       TOTAL SCORE  3/28   Interpretation:  Normal  Normal (0-7) Abnormal (8-28)        Medicare Annual Wellness Visit Summary:  Reviewed patient's Family Medical History Reviewed and updated list of patient's medical providers Assessment of cognitive impairment was done Assessed patient's functional ability Established a written schedule for health  screening Miranda Completed and Reviewed  Exercise Activities and Dietary recommendations Goals    . Prevent Falls          Remain active and continue to prevent falls.        Immunization History  Administered Date(s) Administered  . Pneumococcal Polysaccharide-23 06/26/2012    Health Maintenance  Topic Date Due  . ZOSTAVAX  08/25/1997  . DEXA SCAN  08/25/2002  . PNA vac Low Risk Adult (2 of 2 - PCV13) 06/26/2013  . MAMMOGRAM  01/12/2015  . TETANUS/TDAP  09/13/2017 (Originally 08/25/1954)  . INFLUENZA VACCINE  04/13/2016     Discussed health benefits of physical activity, and encouraged her to engage in regular exercise appropriate for her age and condition.    ------------------------------------------------------------------------------------------------------------   Assessment & Plan:  1. Medicare annual wellness visit, subsequent Measures satisfied - POCT urinalysis dipstick  2. Essential (primary) hypertension controlled - amLODipine (NORVASC) 10 MG tablet; Take 1 tablet (10 mg total) by mouth daily.  Dispense: 90 tablet; Refill: 3  3. Benign esophageal stricture Stable since dilatation Continue daily PPI - omeprazole (PRILOSEC) 20 MG capsule; Take 1 capsule (20 mg total) by mouth daily.  Dispense: 90 capsule; Refill: 3  4. Bigeminy asymptomatic - Comprehensive metabolic panel - Lipid panel  5. Encounter for screening mammogram for breast cancer - MM DIGITAL SCREENING BILATERAL; Future  6. Need for pneumococcal vaccination - Pneumococcal conjugate vaccine 13-valent IM   Halina Maidens, MD [facility]  04/07/2016

## 2016-04-08 LAB — COMPREHENSIVE METABOLIC PANEL
ALK PHOS: 63 IU/L (ref 39–117)
ALT: 17 IU/L (ref 0–32)
AST: 20 IU/L (ref 0–40)
Albumin/Globulin Ratio: 1.6 (ref 1.2–2.2)
Albumin: 4.8 g/dL (ref 3.5–4.8)
BUN/Creatinine Ratio: 26 (ref 12–28)
BUN: 16 mg/dL (ref 8–27)
Bilirubin Total: 0.2 mg/dL (ref 0.0–1.2)
CALCIUM: 9.6 mg/dL (ref 8.7–10.3)
CO2: 23 mmol/L (ref 18–29)
CREATININE: 0.61 mg/dL (ref 0.57–1.00)
Chloride: 100 mmol/L (ref 96–106)
GFR calc Af Amer: 100 mL/min/{1.73_m2} (ref >59)
GFR, EST NON AFRICAN AMERICAN: 87 mL/min/{1.73_m2} (ref >59)
GLUCOSE: 110 mg/dL — AB (ref 65–99)
Globulin, Total: 3 g/dL (ref 1.5–4.5)
Potassium: 4.1 mmol/L (ref 3.5–5.2)
SODIUM: 142 mmol/L (ref 134–144)
Total Protein: 7.8 g/dL (ref 6.0–8.5)

## 2016-04-08 LAB — LIPID PANEL
CHOL/HDL RATIO: 1.9 ratio (ref 0.0–4.4)
CHOLESTEROL TOTAL: 208 mg/dL — AB (ref 100–199)
HDL: 110 mg/dL (ref >39)
LDL CALC: 82 mg/dL (ref 0–99)
TRIGLYCERIDES: 79 mg/dL (ref 0–149)
VLDL Cholesterol Cal: 16 mg/dL (ref 5–40)

## 2016-05-25 ENCOUNTER — Ambulatory Visit (INDEPENDENT_AMBULATORY_CARE_PROVIDER_SITE_OTHER): Payer: Medicare Other | Admitting: Internal Medicine

## 2016-05-25 ENCOUNTER — Encounter: Payer: Self-pay | Admitting: Internal Medicine

## 2016-05-25 VITALS — BP 146/76 | HR 100 | Resp 16 | Ht 68.25 in | Wt 136.0 lb

## 2016-05-25 DIAGNOSIS — I498 Other specified cardiac arrhythmias: Secondary | ICD-10-CM

## 2016-05-25 DIAGNOSIS — I499 Cardiac arrhythmia, unspecified: Secondary | ICD-10-CM | POA: Diagnosis not present

## 2016-05-25 DIAGNOSIS — I10 Essential (primary) hypertension: Secondary | ICD-10-CM

## 2016-05-25 MED ORDER — METOPROLOL SUCCINATE ER 25 MG PO TB24: 25.0000 mg | ORAL_TABLET | Freq: Every day | ORAL | 3 refills | Status: DC

## 2016-05-25 NOTE — Progress Notes (Signed)
Date:  05/25/2016   Name:  Kendra Lane   DOB:  August 16, 1935   MRN:  [id number]   Chief Complaint: Hypertension (170/110 pharmavy )     Hypertension  This is a chronic problem. The current episode started more than 1 year ago. The problem has been waxing and waning since onset. Associated symptoms include palpitations. Pertinent negatives include no chest pain, headaches or shortness of breath. Past treatments include calcium channel blockers. There are no compliance problems.   For the past 2 day BP has been up to 0000000 and [id number] systolic.  She went to pharmacy today and it was still high so was told to come here.    Review of Systems  Respiratory: Negative for cough, chest tightness, shortness of breath and wheezing.   Cardiovascular: Positive for palpitations. Negative for chest pain and leg swelling.  Gastrointestinal: Negative for abdominal pain.  Musculoskeletal: Negative for arthralgias.  Neurological: Negative for syncope, weakness and headaches.  Hematological: Negative for adenopathy.  Psychiatric/Behavioral: Negative for confusion and sleep disturbance.    Patient Active Problem List   Diagnosis Date Noted  . Bigeminy 04/07/2016  . Benign esophageal stricture 01/21/2015  . Essential (primary) hypertension 01/21/2015  . Hammer toe 01/21/2015  . Fatty tumor 01/21/2015  . Low back strain 01/21/2015    Prior to Admission medications   Medication Sig Start Date End Date Taking? Authorizing Provider  amLODipine (NORVASC) 10 MG tablet Take 1 tablet (10 mg total) by mouth daily. 04/07/16  Yes Glean Hess, MD  aspirin 81 MG tablet Take 81 mg by mouth daily.   Yes Historical Provider, MD  fluticasone (FLONASE) 50 MCG/ACT nasal spray SHAKE WELL AND USE 2 SPRAYS IN EACH NOSTRIL EVERY DAY 12/26/15  Yes Glean Hess, MD  Multiple Vitamins-Minerals (CENTRUM SILVER 50+WOMEN PO) Take 1 tablet by mouth daily.   Yes Historical Provider, MD  omeprazole (PRILOSEC) 20 MG  capsule Take 1 capsule (20 mg total) by mouth daily. 04/07/16  Yes Glean Hess, MD    No Known Allergies  Past Surgical History:  Procedure Laterality Date  . ABDOMINAL HYSTERECTOMY    . ESOPHAGOGASTRODUODENOSCOPY (EGD) WITH ESOPHAGEAL DILATION  10/2014   Dr. Allen Norris  . TOTAL VAGINAL HYSTERECTOMY  1993    Social History  Substance Use Topics  . Smoking status: Never Smoker  . Smokeless tobacco: Never Used  . Alcohol use 0.0 oz/week     Comment: 1 beer daily     Medication list has been reviewed and updated.   Physical Exam  Constitutional: She is oriented to person, place, and time. She appears well-developed. No distress.  HENT:  Head: Normocephalic and atraumatic.  Neck: Normal range of motion. Neck supple.  Cardiovascular: Normal rate, regular rhythm, normal heart sounds and intact distal pulses.  Frequent extrasystoles are present.  Pulmonary/Chest: Effort normal and breath sounds normal. No respiratory distress.  Musculoskeletal: Normal range of motion.  Neurological: She is alert and oriented to person, place, and time.  Skin: Skin is warm and dry. No rash noted.  Psychiatric: She has a normal mood and affect. Her behavior is normal. Thought content normal.  Nursing note and vitals reviewed.   BP (!) 146/76 (BP Location: Left Arm)   Pulse 100   Resp 16   Ht 5' 8.25" (1.734 m)   Wt 136 lb (61.7 kg)   SpO2 99%   BMI 20.53 kg/m   Assessment and Plan: 1. Essential (primary) hypertension Will add low  dose metoprolol for improved control - metoprolol succinate (TOPROL-XL) 25 MG 24 hr tablet; Take 1 tablet (25 mg total) by mouth daily.  Dispense: 90 tablet; Refill: 3  2. Bigeminy Asymptomatic - may need Cardiology evaluation if worsening.   Halina Maidens, MD [facility]  05/25/2016

## 2016-05-25 NOTE — Patient Instructions (Signed)
DASH Eating Plan  DASH stands for "Dietary Approaches to Stop Hypertension." The DASH eating plan is a healthy eating plan that has been shown to reduce high blood pressure (hypertension). Additional health benefits may include reducing the risk of type 2 diabetes mellitus, heart disease, and stroke. The DASH eating plan may also help with weight loss.  WHAT DO I NEED TO KNOW ABOUT THE DASH EATING PLAN?  For the DASH eating plan, you will follow these general guidelines:  · Choose foods with a percent daily value for sodium of less than 5% (as listed on the food label).  · Use salt-free seasonings or herbs instead of table salt or sea salt.  · Check with your health care provider or pharmacist before using salt substitutes.  · Eat lower-sodium products, often labeled as "lower sodium" or "no salt added."  · Eat fresh foods.  · Eat more vegetables, fruits, and low-fat dairy products.  · Choose whole grains. Look for the word "whole" as the first word in the ingredient list.  · Choose fish and skinless chicken or turkey more often than red meat. Limit fish, poultry, and meat to 6 oz (170 g) each day.  · Limit sweets, desserts, sugars, and sugary drinks.  · Choose heart-healthy fats.  · Limit cheese to 1 oz (28 g) per day.  · Eat more home-cooked food and less restaurant, buffet, and fast food.  · Limit fried foods.  · Cook foods using methods other than frying.  · Limit canned vegetables. If you do use them, rinse them well to decrease the sodium.  · When eating at a restaurant, ask that your food be prepared with less salt, or no salt if possible.  WHAT FOODS CAN I EAT?  Seek help from a dietitian for individual calorie needs.  Grains  Whole grain or whole wheat bread. Brown rice. Whole grain or whole wheat pasta. Quinoa, bulgur, and whole grain cereals. Low-sodium cereals. Corn or whole wheat flour tortillas. Whole grain cornbread. Whole grain crackers. Low-sodium crackers.  Vegetables  Fresh or frozen vegetables  (raw, steamed, roasted, or grilled). Low-sodium or reduced-sodium tomato and vegetable juices. Low-sodium or reduced-sodium tomato sauce and paste. Low-sodium or reduced-sodium canned vegetables.   Fruits  All fresh, canned (in natural juice), or frozen fruits.  Meat and Other Protein Products  Ground beef (85% or leaner), grass-fed beef, or beef trimmed of fat. Skinless chicken or turkey. Ground chicken or turkey. Pork trimmed of fat. All fish and seafood. Eggs. Dried beans, peas, or lentils. Unsalted nuts and seeds. Unsalted canned beans.  Dairy  Low-fat dairy products, such as skim or 1% milk, 2% or reduced-fat cheeses, low-fat ricotta or cottage cheese, or plain low-fat yogurt. Low-sodium or reduced-sodium cheeses.  Fats and Oils  Tub margarines without trans fats. Light or reduced-fat mayonnaise and salad dressings (reduced sodium). Avocado. Safflower, olive, or canola oils. Natural peanut or almond butter.  Other  Unsalted popcorn and pretzels.  The items listed above may not be a complete list of recommended foods or beverages. Contact your dietitian for more options.  WHAT FOODS ARE NOT RECOMMENDED?  Grains  White bread. White pasta. White rice. Refined cornbread. Bagels and croissants. Crackers that contain trans fat.  Vegetables  Creamed or fried vegetables. Vegetables in a cheese sauce. Regular canned vegetables. Regular canned tomato sauce and paste. Regular tomato and vegetable juices.  Fruits  Dried fruits. Canned fruit in light or heavy syrup. Fruit juice.  Meat and Other Protein   Products  Fatty cuts of meat. Ribs, chicken wings, bacon, sausage, bologna, salami, chitterlings, fatback, hot dogs, bratwurst, and packaged luncheon meats. Salted nuts and seeds. Canned beans with salt.  Dairy  Whole or 2% milk, cream, half-and-half, and cream cheese. Whole-fat or sweetened yogurt. Full-fat cheeses or blue cheese. Nondairy creamers and whipped toppings. Processed cheese, cheese spreads, or cheese  curds.  Condiments  Onion and garlic salt, seasoned salt, table salt, and sea salt. Canned and packaged gravies. Worcestershire sauce. Tartar sauce. Barbecue sauce. Teriyaki sauce. Soy sauce, including reduced sodium. Steak sauce. Fish sauce. Oyster sauce. Cocktail sauce. Horseradish. Ketchup and mustard. Meat flavorings and tenderizers. Bouillon cubes. Hot sauce. Tabasco sauce. Marinades. Taco seasonings. Relishes.  Fats and Oils  Butter, stick margarine, lard, shortening, ghee, and bacon fat. Coconut, palm kernel, or palm oils. Regular salad dressings.  Other  Pickles and olives. Salted popcorn and pretzels.  The items listed above may not be a complete list of foods and beverages to avoid. Contact your dietitian for more information.  WHERE CAN I FIND MORE INFORMATION?  National Heart, Lung, and [employer] Institute: www.nhlbi.nih.gov/health/health-topics/topics/dash/     This information is not intended to replace advice given to you by your health care provider. Make sure you discuss any questions you have with your health care provider.     Document Released: 08/19/2011 Document Revised: 09/20/2014 Document Reviewed: 07/04/2013  Elsevier Interactive Patient Education ©2016 Elsevier Inc.

## 2016-07-06 ENCOUNTER — Ambulatory Visit (INDEPENDENT_AMBULATORY_CARE_PROVIDER_SITE_OTHER): Payer: Medicare Other | Admitting: Internal Medicine

## 2016-07-06 ENCOUNTER — Encounter: Payer: Self-pay | Admitting: Internal Medicine

## 2016-07-06 VITALS — BP 137/80 | HR 87 | Resp 16 | Ht 68.2 in | Wt 138.0 lb

## 2016-07-06 DIAGNOSIS — I10 Essential (primary) hypertension: Secondary | ICD-10-CM

## 2016-07-06 DIAGNOSIS — Z1231 Encounter for screening mammogram for malignant neoplasm of breast: Secondary | ICD-10-CM

## 2016-07-06 DIAGNOSIS — I499 Cardiac arrhythmia, unspecified: Secondary | ICD-10-CM

## 2016-07-06 DIAGNOSIS — I498 Other specified cardiac arrhythmias: Secondary | ICD-10-CM

## 2016-07-06 NOTE — Patient Instructions (Signed)
Breast Self-Awareness Practicing breast self-awareness may pick up problems early, prevent significant medical complications, and possibly save your life. By practicing breast self-awareness, you can become familiar with how your breasts look and feel and if your breasts are changing. This allows you to notice changes early. It can also offer you some reassurance that your breast health is good. One way to learn what is normal for your breasts and whether your breasts are changing is to do a breast self-exam. If you find a lump or something that was not present in the past, it is best to contact your caregiver right away. Other findings that should be evaluated by your caregiver include nipple discharge, especially if it is bloody; skin changes or reddening; areas where the skin seems to be pulled in (retracted); or new lumps and bumps. Breast pain is seldom associated with cancer (malignancy), but should also be evaluated by a caregiver. HOW TO PERFORM A BREAST SELF-EXAM The best time to examine your breasts is 5-7 days after your menstrual period is over. During menstruation, the breasts are lumpier, and it may be more difficult to pick up changes. If you do not menstruate, have reached menopause, or had your uterus removed (hysterectomy), you should examine your breasts at regular intervals, such as monthly. If you are breastfeeding, examine your breasts after a feeding or after using a breast pump. Breast implants do not decrease the risk for lumps or tumors, so continue to perform breast self-exams as recommended. Talk to your caregiver about how to determine the difference between the implant and breast tissue. Also, talk about the amount of pressure you should use during the exam. Over time, you will become more familiar with the variations of your breasts and more comfortable with the exam. A breast self-exam requires you to remove all your clothes above the waist. 1. Look at your breasts and nipples.  Stand in front of a mirror in a room with good lighting. With your hands on your hips, push your hands firmly downward. Look for a difference in shape, contour, and size from one breast to the other (asymmetry). Asymmetry includes puckers, dips, or bumps. Also, look for skin changes, such as reddened or scaly areas on the breasts. Look for nipple changes, such as discharge, dimpling, repositioning, or redness. 2. Carefully feel your breasts. This is best done either in the shower or tub while using soapy water or when flat on your back. Place the arm (on the side of the breast you are examining) above your head. Use the pads (not the fingertips) of your three middle fingers on your opposite hand to feel your breasts. Start in the underarm area and use  inch (2 cm) overlapping circles to feel your breast. Use 3 different levels of pressure (light, medium, and firm pressure) at each circle before moving to the next circle. The light pressure is needed to feel the tissue closest to the skin. The medium pressure will help to feel breast tissue a little deeper, while the firm pressure is needed to feel the tissue close to the ribs. Continue the overlapping circles, moving downward over the breast until you feel your ribs below your breast. Then, move one finger-width towards the center of the body. Continue to use the  inch (2 cm) overlapping circles to feel your breast as you move slowly up toward the collar bone (clavicle) near the base of the neck. Continue the up and down exam using all 3 pressures until you reach the   middle of the chest. Do this with each breast, carefully feeling for lumps or changes. 3.  Keep a written record with breast changes or normal findings for each breast. By writing this information down, you do not need to depend only on memory for size, tenderness, or location. Write down where you are in your menstrual cycle, if you are still menstruating. Breast tissue can have some lumps or  thick tissue. However, see your caregiver if you find anything that concerns you.  SEEK MEDICAL CARE IF:  You see a change in shape, contour, or size of your breasts or nipples.   You see skin changes, such as reddened or scaly areas on the breasts or nipples.   You have an unusual discharge from your nipples.   You feel a new lump or unusually thick areas.    This information is not intended to replace advice given to you by your health care provider. Make sure you discuss any questions you have with your health care provider.   Document Released: 08/30/2005 Document Revised: 08/16/2012 Document Reviewed: 12/15/2011 Elsevier Interactive Patient Education 2016 Elsevier Inc.  

## 2016-07-06 NOTE — Progress Notes (Signed)
Date:  07/06/2016   Name:  Kendra Lane   DOB:  30-Oct-1934   MRN:  [id number]   Chief Complaint: Hypertension (New pill added has helped control and patient feels great. Chescking BP twice week and it has ran around  135/80) Hypertension  This is a chronic problem. The problem has been gradually improving since onset. The problem is controlled. Associated symptoms include palpitations (have resolved with addition of metoprolol). Pertinent negatives include no chest pain, headaches or shortness of breath. Past treatments include beta blockers and calcium channel blockers. The current treatment provides significant improvement.    Review of Systems  Constitutional: Negative for diaphoresis, fatigue and fever.  Respiratory: Negative for cough, chest tightness, shortness of breath and wheezing.   Cardiovascular: Positive for palpitations (have resolved with addition of metoprolol). Negative for chest pain and leg swelling.  Gastrointestinal: Negative for abdominal pain and constipation.  Neurological: Negative for dizziness, tremors, syncope and headaches.  Psychiatric/Behavioral: Negative for sleep disturbance.    Patient Active Problem List   Diagnosis Date Noted  . Bigeminy 04/07/2016  . Benign esophageal stricture 01/21/2015  . Essential (primary) hypertension 01/21/2015  . Hammer toe 01/21/2015  . Fatty tumor 01/21/2015  . Low back strain 01/21/2015    Prior to Admission medications   Medication Sig Start Date End Date Taking? Authorizing Provider  amLODipine (NORVASC) 10 MG tablet Take 1 tablet (10 mg total) by mouth daily. 04/07/16  Yes Glean Hess, MD  aspirin 81 MG tablet Take 81 mg by mouth daily.   Yes Historical Provider, MD  fluticasone (FLONASE) 50 MCG/ACT nasal spray SHAKE WELL AND USE 2 SPRAYS IN EACH NOSTRIL EVERY DAY 12/26/15  Yes Glean Hess, MD  metoprolol succinate (TOPROL-XL) 25 MG 24 hr tablet Take 1 tablet (25 mg total) by mouth daily. 05/25/16   Yes Glean Hess, MD  Multiple Vitamins-Minerals (CENTRUM SILVER 50+WOMEN PO) Take 1 tablet by mouth daily.   Yes Historical Provider, MD  omeprazole (PRILOSEC) 20 MG capsule Take 1 capsule (20 mg total) by mouth daily. 04/07/16  Yes Glean Hess, MD    No Known Allergies  Past Surgical History:  Procedure Laterality Date  . ABDOMINAL HYSTERECTOMY    . ESOPHAGOGASTRODUODENOSCOPY (EGD) WITH ESOPHAGEAL DILATION  10/2014   Dr. Allen Norris  . TOTAL VAGINAL HYSTERECTOMY  1993    Social History  Substance Use Topics  . Smoking status: Never Smoker  . Smokeless tobacco: Never Used  . Alcohol use 0.0 oz/week     Comment: 1 beer daily     Medication list has been reviewed and updated.   Physical Exam  Constitutional: She is oriented to person, place, and time. She appears well-developed. No distress.  HENT:  Head: Normocephalic and atraumatic.  Cardiovascular: Normal rate, regular rhythm, S1 normal, normal heart sounds and normal pulses.   No extrasystoles are present.  Pulmonary/Chest: Effort normal and breath sounds normal. No respiratory distress. She has no decreased breath sounds. She has no wheezes.  Musculoskeletal: Normal range of motion.  Neurological: She is alert and oriented to person, place, and time.  Skin: Skin is warm and dry. No rash noted.  Psychiatric: She has a normal mood and affect. Her behavior is normal. Thought content normal.  Nursing note and vitals reviewed.   BP 137/80   Pulse 87   Resp 16   Ht 5' 8.2" (1.732 m)   Wt 138 lb (62.6 kg)   SpO2 100%   BMI  20.86 kg/m   Assessment and Plan: 1. Essential (primary) hypertension Improved control - continue current therapy  2. Bigeminy Appears improved; consider ZIO if recurrent  3. Encounter for screening mammogram for breast cancer Pt to schedule mammogram Consider DEXA   Halina Maidens, MD [facility]  07/06/2016

## 2016-09-16 ENCOUNTER — Ambulatory Visit: Admission: RE | Admit: 2016-09-16 | Payer: Medicare Other | Source: Ambulatory Visit

## 2016-09-23 ENCOUNTER — Ambulatory Visit
Admission: RE | Admit: 2016-09-23 | Discharge: 2016-09-23 | Disposition: A | Discharge status: Home / Self Care | Payer: Medicare Other | Source: Ambulatory Visit | Attending: Internal Medicine | Admitting: Internal Medicine

## 2016-09-23 DIAGNOSIS — Z1231 Encounter for screening mammogram for malignant neoplasm of breast: Secondary | ICD-10-CM | POA: Insufficient documentation

## 2016-10-07 ENCOUNTER — Ambulatory Visit (INDEPENDENT_AMBULATORY_CARE_PROVIDER_SITE_OTHER)
Admission: EM | Admit: 2016-10-07 | Discharge: 2016-10-07 | Disposition: A | Discharge status: Other Acute Inpatient Hospital | Payer: Medicare Other | Source: Home / Self Care | Attending: Family Medicine | Admitting: Family Medicine

## 2016-10-07 ENCOUNTER — Emergency Department: Payer: Medicare Other

## 2016-10-07 ENCOUNTER — Encounter: Payer: Self-pay | Admitting: *Deleted

## 2016-10-07 ENCOUNTER — Emergency Department
Admission: EM | Admit: 2016-10-07 | Discharge: 2016-10-07 | Disposition: A | Discharge status: Home / Self Care | Payer: Medicare Other | Attending: Emergency Medicine | Admitting: Emergency Medicine

## 2016-10-07 DIAGNOSIS — I10 Essential (primary) hypertension: Secondary | ICD-10-CM | POA: Diagnosis not present

## 2016-10-07 DIAGNOSIS — R079 Chest pain, unspecified: Secondary | ICD-10-CM | POA: Diagnosis not present

## 2016-10-07 DIAGNOSIS — R0602 Shortness of breath: Secondary | ICD-10-CM | POA: Diagnosis not present

## 2016-10-07 DIAGNOSIS — R0600 Dyspnea, unspecified: Secondary | ICD-10-CM | POA: Diagnosis not present

## 2016-10-07 DIAGNOSIS — Z7982 Long term (current) use of aspirin: Secondary | ICD-10-CM | POA: Insufficient documentation

## 2016-10-07 DIAGNOSIS — Z79899 Other long term (current) drug therapy: Secondary | ICD-10-CM | POA: Insufficient documentation

## 2016-10-07 DIAGNOSIS — R002 Palpitations: Secondary | ICD-10-CM

## 2016-10-07 DIAGNOSIS — R0789 Other chest pain: Secondary | ICD-10-CM | POA: Diagnosis not present

## 2016-10-07 DIAGNOSIS — I208 Other forms of angina pectoris: Secondary | ICD-10-CM | POA: Diagnosis not present

## 2016-10-07 DIAGNOSIS — I498 Other specified cardiac arrhythmias: Secondary | ICD-10-CM | POA: Insufficient documentation

## 2016-10-07 DIAGNOSIS — R0609 Other forms of dyspnea: Secondary | ICD-10-CM

## 2016-10-07 LAB — CBC
HCT: 39.4 % (ref 35.0–47.0)
Hemoglobin: 13.4 g/dL (ref 12.0–16.0)
MCH: 31.4 pg (ref 26.0–34.0)
MCHC: 33.9 g/dL (ref 32.0–36.0)
MCV: 92.5 fL (ref 80.0–100.0)
PLATELETS: 331 10*3/uL (ref 150–440)
RBC: 4.26 MIL/uL (ref 3.80–5.20)
RDW: 13.2 % (ref 11.5–14.5)
WBC: 7.3 10*3/uL (ref 3.6–11.0)

## 2016-10-07 LAB — BASIC METABOLIC PANEL
Anion gap: 9 (ref 5–15)
BUN: 16 mg/dL (ref 6–20)
CALCIUM: 9.1 mg/dL (ref 8.9–10.3)
CHLORIDE: 105 mmol/L (ref 101–111)
CO2: 27 mmol/L (ref 22–32)
CREATININE: 0.66 mg/dL (ref 0.44–1.00)
GFR calc Af Amer: >60 mL/min (ref >60)
GFR calc non Af Amer: >60 mL/min (ref >60)
GLUCOSE: 114 mg/dL — AB (ref 65–99)
Potassium: 3.7 mmol/L (ref 3.5–5.1)
Sodium: 141 mmol/L (ref 135–145)

## 2016-10-07 LAB — TROPONIN I

## 2016-10-07 MED ORDER — ASPIRIN 81 MG PO CHEW
CHEWABLE_TABLET | ORAL | Status: AC
  Filled 2016-10-07: qty 4

## 2016-10-07 MED ORDER — ASPIRIN 81 MG PO CHEW
324.0000 mg | CHEWABLE_TABLET | Freq: Once | ORAL | Status: AC
  Administered 2016-10-07: 324 mg via ORAL

## 2016-10-07 NOTE — Discharge Instructions (Signed)
You have been seen in the Emergency Department (ED) today for shortness of breath with exertion.  As we have discussed today?s test results are normal, but you may require further testing.  Please follow up with the recommended doctor as instructed above in these documents regarding today?s emergent visit and your recent symptoms to discuss further management.  Continue to take your regular medications. If you are not doing so already, please also take a daily baby aspirin (81 mg), at least until you follow up with your doctor.  Return to the Emergency Department (ED) if you experience any further chest pain/pressure/tightness, difficulty breathing, or sudden sweating, or other symptoms that concern you.

## 2016-10-07 NOTE — ED Triage Notes (Signed)
Pt c/o irregular heartbeat this has happened before she came here and we sent her to [facility], she says they didn't do anything and she doesn't have a cardiologist, Not a very good historian. BP today 188/80 and temp 99.1

## 2016-10-07 NOTE — ED Triage Notes (Signed)
Pt brought in via ems from [facility] urgent care with sob.  No chest pain.  Sx began today.  No cough  Nonsmoker.  No fever.  Pt alert.  Speech clear

## 2016-10-07 NOTE — ED Provider Notes (Signed)
[facility] Emergency Department Provider Note  ____________________________________________   First MD Initiated Contact with Patient 10/07/16 2146     (approximate)  I have reviewed the triage vital signs and the nursing notes.   HISTORY  Chief Complaint Shortness of Breath    HPI Kendra Lane is a 82 y.o. female who is quite healthy and well-appearing for her age and presents for evaluation of shortness of breath.  She reports that at home today she was up and moving around the house and doing her usual routine and became short of breath "like as if I had been running.".  Improved after sitting down and resting but then happened again another one or 2 times.  At no point did she have any chest pain.  She also denies nausea, vomiting, abdominal pain, fever/chills, dysuria.  She states that the symptoms were moderate but noticeable because they are different.  She does state, however, that she had a very similar episode happen about a year ago which she evaluated with her primary care doctor and they did not find anything.  She went to the urgent care who is concerned about possible irregular heartbeat and sent her here for further evaluation.Of note, the provider note from the urgent care mentions palpitations and chest pain with exertion, but the patient is extremely adamant that she never had pain and she never felt palpitations, she simply was short of breath.   Past Medical History:  Diagnosis Date  . GERD (gastroesophageal reflux disease)   . Hypertension     Patient Active Problem List   Diagnosis Date Noted  . Bigeminy 04/07/2016  . Benign esophageal stricture 01/21/2015  . Essential (primary) hypertension 01/21/2015  . Hammer toe 01/21/2015  . Fatty tumor 01/21/2015  . Low back strain 01/21/2015    Past Surgical History:  Procedure Laterality Date  . ABDOMINAL HYSTERECTOMY    . ESOPHAGOGASTRODUODENOSCOPY (EGD) WITH ESOPHAGEAL  DILATION  10/2014   Dr. Allen Norris  . TOTAL VAGINAL HYSTERECTOMY  1993    Prior to Admission medications   Medication Sig Start Date End Date Taking? Authorizing Provider  amLODipine (NORVASC) 10 MG tablet Take 1 tablet (10 mg total) by mouth daily. 04/07/16   Glean Hess, MD  aspirin 81 MG tablet Take 81 mg by mouth daily.    Historical Provider, MD  fluticasone (FLONASE) 50 MCG/ACT nasal spray SHAKE WELL AND USE 2 SPRAYS IN EACH NOSTRIL EVERY DAY 12/26/15   Glean Hess, MD  metoprolol succinate (TOPROL-XL) 25 MG 24 hr tablet Take 1 tablet (25 mg total) by mouth daily. 05/25/16   Glean Hess, MD  Multiple Vitamins-Minerals (CENTRUM SILVER 50+WOMEN PO) Take 1 tablet by mouth daily.    Historical Provider, MD  omeprazole (PRILOSEC) 20 MG capsule Take 1 capsule (20 mg total) by mouth daily. 04/07/16   Glean Hess, MD    Allergies Patient has no known allergies.  Family History  Problem Relation Age of Onset  . Breast cancer Daughter 100    Social History Social History  Substance Use Topics  . Smoking status: Never Smoker  . Smokeless tobacco: Never Used  . Alcohol use 0.0 oz/week     Comment: 1 beer daily    Review of Systems Constitutional: No fever/chills Eyes: No visual changes. ENT: No sore throat. Cardiovascular: Denies chest pain. Respiratory: Shortness of breath with exertion Gastrointestinal: No abdominal pain.  No nausea, no vomiting.  No diarrhea.  No constipation. Genitourinary: Negative  for dysuria. Musculoskeletal: Negative for back pain. Skin: Negative for rash. Neurological: Negative for headaches, focal weakness or numbness.  10-point ROS otherwise negative.  ____________________________________________   PHYSICAL EXAM:  VITAL SIGNS: ED Triage Vitals  Enc Vitals Group     BP 10/07/16 1817 (!) 164/81     Pulse Rate 10/07/16 1817 87     Resp 10/07/16 1817 16     Temp 10/07/16 1817 98.4 F (36.9 C)     Temp Source 10/07/16 1817 Oral      SpO2 10/07/16 1817 99 %     Weight 10/07/16 1818 140 lb (63.5 kg)     Height 10/07/16 1818 5\' 6"  (1.676 m)     Head Circumference --      Peak Flow --      Pain Score --      Pain Loc --      Pain Edu? --      Excl. in [place]? --     Constitutional: Alert and oriented. Well appearing and in no acute distress. Eyes: Conjunctivae are normal. PERRL. EOMI. Head: Atraumatic. Nose: No congestion/rhinnorhea. Mouth/Throat: Mucous membranes are moist.  Oropharynx non-erythematous. Neck: No stridor.  No meningeal signs.   Cardiovascular: Normal rate, regular rhythm. Good peripheral circulation. Grossly normal heart sounds. Respiratory: Normal respiratory effort.  No retractions. Lungs CTAB. Gastrointestinal: Soft and nontender. No distention.  Musculoskeletal: No lower extremity tenderness nor edema. No gross deformities of extremities. Neurologic:  Normal speech and language. No gross focal neurologic deficits are appreciated.  Skin:  Skin is warm, dry and intact. No rash noted. Psychiatric: Mood and affect are normal. Speech and behavior are normal.  ____________________________________________   LABS (all labs ordered are listed, but only abnormal results are displayed)  Labs Reviewed  BASIC METABOLIC PANEL - Abnormal; Notable for the following:       Result Value   Glucose, Bld 114 (*)    All other components within normal limits  CBC  TROPONIN I   ____________________________________________  EKG  ED ECG REPORT I, Marjean Imperato, the attending physician, personally viewed and interpreted this ECG.  Date: 10/07/2016 EKG Time: 18:24 Rate: 86 Rhythm: normal sinus rhythm with some sinus arrhythmia QRS Axis: normal Intervals: normal ST/T Wave abnormalities: normal Conduction Disturbances: none Narrative Interpretation: unremarkable  ____________________________________________  RADIOLOGY   Dg Chest 2 View  Result Date: 10/07/2016 CLINICAL DATA:  Shortness of breath.   Palpitations. EXAM: CHEST  2 VIEW COMPARISON:  03/20/2016. FINDINGS: Normal sized heart. Clear lungs. Aortic calcifications. Diffuse osteopenia. Mild scoliosis. IMPRESSION: No acute abnormality.  Aortic atherosclerosis. Electronically Signed   By: Claudie Revering M.D.   On: 10/07/2016 19:11    ____________________________________________   PROCEDURES  Procedure(s) performed:   Procedures   Critical Care performed: No ____________________________________________   INITIAL IMPRESSION / ASSESSMENT AND PLAN / ED COURSE  Pertinent labs & imaging results that were available during my care of the patient were reviewed by me and considered in my medical decision making (see chart for details).  Patient is well-appearing and in no acute distress.  She states that she has been ambulating around the emergency department during her long wait and has had no additional shortness of breath.  There was likely miscommunication or misunderstanding but she is very adamant that she never had any chest pain or sensation of palpitations.  Her HEART score is 3 (low risk) and she has the ability to follow up closely as an outpatient.  I had my usual "  spectrum of ACS" discussion and explained that she needs to follow up at the next available opportunity either with her primary care doctor or with the cardiologist, most likely to have a stress test performed.  She understands and agrees with the plan.  I gave strict return precautions.  I also gave her a full dose aspirin while in the emergency department.    ____________________________________________  FINAL CLINICAL IMPRESSION(S) / ED DIAGNOSES  Final diagnoses:  Exertional dyspnea     MEDICATIONS GIVEN DURING THIS VISIT:  Medications  aspirin chewable tablet 324 mg (324 mg Oral Given 10/07/16 2206)     NEW OUTPATIENT MEDICATIONS STARTED DURING THIS VISIT:  Discharge Medication List as of 10/07/2016 10:03 PM      Discharge Medication List as of  10/07/2016 10:03 PM      Discharge Medication List as of 10/07/2016 10:03 PM       Note:  This document was prepared using Dragon voice recognition software and may include unintentional dictation errors.    Hinda Kehr, MD 10/07/16 [phone]

## 2016-10-07 NOTE — ED Provider Notes (Addendum)
[facility]    CSN: [id number] Arrival date & time: 10/07/16  1517     History   Chief Complaint Chief Complaint  Patient presents with  . Irregular Heart Beat    HPI Kendra Lane is a 82 y.o. female.   Patient's here because of chest pain. She reports having palpitations chest pain earlier this afternoon after exerting herself home cleaning. She reports the chest pain has since cleared since she stopped cleaning the house. Should be noted that she had similar episode like this this summer states that she was seen at [facility] the summer for shortness of breath and palpitations. According to her she received blood work eventually sent home. She is seeing her PCP in the last PCP was in November but no one has scheduled her for a stress test. She reports feeling much better since she is not exerting herself. In fact palpitations and cleared chest pain is cleared since she stopped and rested for a while and she has a history of primary hypertension low back strain and GERD. Surgeries included total vaginal hysterectomy and endoscopy in the past. She has never smoked. Daughters had breast cancer.   The history is provided by the patient. No language interpreter was used.  Palpitations  Palpitations quality:  Irregular Onset quality:  With exertion Progression:  Resolved Chronicity:  New Context: not hyperventilation   Relieved by:  Bed rest Worsened by:  Nothing Associated symptoms: chest pain and shortness of breath   Chest pain:    Quality: pressure and sharp     Severity:  Moderate   Progression:  Resolved Risk factors: no diabetes mellitus     Past Medical History:  Diagnosis Date  . GERD (gastroesophageal reflux disease)   . Hypertension     Patient Active Problem List   Diagnosis Date Noted  . Bigeminy 04/07/2016  . Benign esophageal stricture 01/21/2015  . Essential (primary) hypertension 01/21/2015  . Hammer toe 01/21/2015  . Fatty tumor 01/21/2015    . Low back strain 01/21/2015    Past Surgical History:  Procedure Laterality Date  . ABDOMINAL HYSTERECTOMY    . ESOPHAGOGASTRODUODENOSCOPY (EGD) WITH ESOPHAGEAL DILATION  10/2014   Dr. Allen Norris  . TOTAL VAGINAL HYSTERECTOMY  1993    OB History    No data available       Home Medications    Prior to Admission medications   Medication Sig Start Date End Date Taking? Authorizing Provider  amLODipine (NORVASC) 10 MG tablet Take 1 tablet (10 mg total) by mouth daily. 04/07/16  Yes Glean Hess, MD  aspirin 81 MG tablet Take 81 mg by mouth daily.   Yes Historical Provider, MD  metoprolol succinate (TOPROL-XL) 25 MG 24 hr tablet Take 1 tablet (25 mg total) by mouth daily. 05/25/16  Yes Glean Hess, MD  Multiple Vitamins-Minerals (CENTRUM SILVER 50+WOMEN PO) Take 1 tablet by mouth daily.   Yes Historical Provider, MD  omeprazole (PRILOSEC) 20 MG capsule Take 1 capsule (20 mg total) by mouth daily. 04/07/16  Yes Glean Hess, MD  fluticasone [facility]) 50 MCG/ACT nasal spray SHAKE WELL AND USE 2 SPRAYS IN [facility] NOSTRIL EVERY DAY 12/26/15   Glean Hess, MD    Family History Family History  Problem Relation Age of Onset  . Breast cancer Daughter 3    Social History Social History  Substance Use Topics  . Smoking status: Never Smoker  . Smokeless tobacco: Never Used  . Alcohol use 0.0  oz/week     Comment: 1 beer daily     Allergies   Patient has no known allergies.   Review of Systems Review of Systems  Respiratory: Positive for chest tightness and shortness of breath.   Cardiovascular: Positive for chest pain and palpitations.  All other systems reviewed and are negative.    Physical Exam Triage Vital Signs ED Triage Vitals  Enc Vitals Group     BP 10/07/16 1533 (!) 188/80     Pulse Rate 10/07/16 1533 95     Resp 10/07/16 1533 20     Temp 10/07/16 1533 99.1 F (37.3 C)     Temp Source 10/07/16 1533 Oral     SpO2 10/07/16 1533 100 %     Weight  10/07/16 1534 140 lb (63.5 kg)     Height 10/07/16 1534 5\' 6"  (1.676 m)     Head Circumference --      Peak Flow --      Pain Score 10/07/16 1535 0     Pain Loc --      Pain Edu? --      Excl. in [place]? --    No data found.   Updated Vital Signs BP (!) 188/80 (BP Location: Left Arm)   Pulse 95   Temp 99.1 F (37.3 C) (Oral)   Resp 20   Ht 5\' 6"  (1.676 m)   Wt 140 lb (63.5 kg)   SpO2 100%   BMI 22.60 kg/m   Visual Acuity Right Eye Distance:   Left Eye Distance:   Bilateral Distance:    Right Eye Near:   Left Eye Near:    Bilateral Near:     Physical Exam  Constitutional: She is oriented to person, place, and time. She appears well-developed and well-nourished. No distress.  HENT:  Head: Normocephalic and atraumatic.  Eyes: Pupils are equal, round, and reactive to light.  Neck: Normal range of motion. Neck supple.  Cardiovascular: Normal rate and regular rhythm.   Pulmonary/Chest: Effort normal and breath sounds normal.  Musculoskeletal: Normal range of motion. She exhibits no deformity.  Neurological: She is alert and oriented to person, place, and time.  Skin: Skin is warm. She is not diaphoretic.  Psychiatric: She has a normal mood and affect.  Vitals reviewed.    UC Treatments / Results  Labs (all labs ordered are listed, but only abnormal results are displayed) Labs Reviewed - No data to display  EKG  EKG Interpretation None     ED ECG REPORT I, Angelus Hoopes H, the attending physician, personally viewed and interpreted this ECG.   Date: 10/07/2016  EKG Time: 15:41:48  [id number]  Rhythm: there are no previous tracings available for comparison, normal sinus rhythm  Axis: 2  Intervals:sinus arrthymoia  ST&T Change: none  Radiology No results found.  Procedures Procedures (including critical care time)  Medications Ordered in UC Medications - No data to display   Initial Impression / Assessment and Plan / UC Course  I have reviewed the triage  vital signs and the nursing notes.  Pertinent labs & imaging results that were available during my care of the patient were reviewed by me and considered in my medical decision making (see chart for details).    .This patient's new onset of chest pain with exertion and never having a stress test plans will be to send her to the ED for evaluation. She and her daughter decided to go by EMS Set designer at [employer]  [facility] informed of her pending arrival.   Final Clinical Impressions(s) / UC Diagnoses   Final diagnoses:  Palpitation  Chest pain, atypical  Angina of effort [facility])    New Prescriptions New Prescriptions   No medications on file    Note: This dictation was prepared with Dragon dictation along with smaller phrase technology. Any transcriptional errors that result from this process are unintentional.   Frederich Cha, MD 10/07/16 [place], MD 10/07/16 1723

## 2016-10-26 DIAGNOSIS — R0602 Shortness of breath: Secondary | ICD-10-CM | POA: Diagnosis not present

## 2016-10-26 DIAGNOSIS — R079 Chest pain, unspecified: Secondary | ICD-10-CM | POA: Diagnosis not present

## 2016-12-07 DIAGNOSIS — R0602 Shortness of breath: Secondary | ICD-10-CM | POA: Diagnosis not present

## 2016-12-07 DIAGNOSIS — R079 Chest pain, unspecified: Secondary | ICD-10-CM | POA: Diagnosis not present

## 2017-02-13 ENCOUNTER — Ambulatory Visit
Admission: EM | Admit: 2017-02-13 | Discharge: 2017-02-13 | Disposition: A | Discharge status: Home / Self Care | Payer: Medicare Other | Attending: Family Medicine | Admitting: Family Medicine

## 2017-02-13 DIAGNOSIS — M436 Torticollis: Secondary | ICD-10-CM | POA: Diagnosis not present

## 2017-02-13 MED ORDER — BACLOFEN 10 MG PO TABS: 10.0000 mg | ORAL_TABLET | Freq: Three times a day (TID) | ORAL | 0 refills | Status: DC | PRN

## 2017-02-13 NOTE — ED Provider Notes (Signed)
[facility]    CSN: [id number] Arrival date & time: 02/13/17  3016   History   Chief Complaint Chief Complaint  Patient presents with  . Torticollis   HPI  82 year old female presents with complaints of neck pain and stiffness.  Patient has a history of esophageal stricture, bigeminy, hypertension.  Patient reports that yesterday and this morning, she's been bothered by right sided neck pain. She reports difficulty rotating her head to the right. Stiffness. She does not recall any fall, trauma, injury. She has been taking aspirin without significant improvement. No other medications or interventions tried. No reports of radicular symptoms. No headache. No other associated symptoms. Worsened by range of motion. No other complaints or concerns at this time  Past Medical History:  Diagnosis Date  . GERD (gastroesophageal reflux disease)   . Hypertension     Patient Active Problem List   Diagnosis Date Noted  . Bigeminy 04/07/2016  . Benign esophageal stricture 01/21/2015  . Essential (primary) hypertension 01/21/2015  . Hammer toe 01/21/2015  . Fatty tumor 01/21/2015    Past Surgical History:  Procedure Laterality Date  . ABDOMINAL HYSTERECTOMY    . ESOPHAGOGASTRODUODENOSCOPY (EGD) WITH ESOPHAGEAL DILATION  10/2014   Dr. Allen Norris  . TOTAL VAGINAL HYSTERECTOMY  1993    OB History    No data available     Home Medications    Prior to Admission medications   Medication Sig Start Date End Date Taking? Authorizing Provider  amLODipine (NORVASC) 10 MG tablet Take 1 tablet (10 mg total) by mouth daily. 04/07/16   Glean Hess, MD  aspirin 81 MG tablet Take 81 mg by mouth daily.    [provider]  baclofen (LIORESAL) 10 MG tablet Take 1 tablet (10 mg total) by mouth 3 (three) times daily as needed for muscle spasms. 02/13/17   Larenda Reedy, Barnie Del, DO  fluticasone (FLONASE) 50 MCG/ACT nasal spray SHAKE WELL AND USE 2 SPRAYS IN EACH NOSTRIL EVERY DAY 12/26/15    Glean Hess, MD  metoprolol succinate (TOPROL-XL) 25 MG 24 hr tablet Take 1 tablet (25 mg total) by mouth daily. 05/25/16   Glean Hess, MD  Multiple Vitamins-Minerals (CENTRUM SILVER 50+WOMEN PO) Take 1 tablet by mouth daily.    [provider]  omeprazole (PRILOSEC) 20 MG capsule Take 1 capsule (20 mg total) by mouth daily. 04/07/16   Glean Hess, MD   Family History Family History  Problem Relation Age of Onset  . Breast cancer Daughter 24   Social History Social History  Substance Use Topics  . Smoking status: Never Smoker  . Smokeless tobacco: Never Used  . Alcohol use 0.0 oz/week     Comment: 1 beer daily   Allergies   Patient has no known allergies.  Review of Systems Review of Systems  Constitutional: Negative.   Musculoskeletal: Positive for neck pain and neck stiffness.   Physical Exam Triage Vital Signs ED Triage Vitals  Enc Vitals Group     BP 02/13/17 0944 (!) 161/79     Pulse Rate 02/13/17 0944 80     Resp 02/13/17 0944 16     Temp 02/13/17 0944 97.6 F (36.4 C)     Temp Source 02/13/17 0944 Oral     SpO2 02/13/17 0944 100 %     Weight 02/13/17 0943 140 lb (63.5 kg)     Height 02/13/17 0943 5\' 9"  (1.753 m)     Head Circumference --  Peak Flow --      Pain Score 02/13/17 0945 8     Pain Loc --      Pain Edu? --      Excl. in [place]? --    No data found.   Updated Vital Signs BP (!) 161/79 (BP Location: Left Arm)   Pulse 80   Temp 97.6 F (36.4 C) (Oral)   Resp 16   Ht 5\' 9"  (1.753 m)   Wt 140 lb (63.5 kg)   SpO2 100%   BMI 20.67 kg/m  Physical Exam  Constitutional: She is oriented to person, place, and time. She appears well-developed. No distress.  Neck:  Decreased range of motion in right rotation.  Cardiovascular: Normal rate.   Irregular rhythm. Secondary to ectopy has patient has known bigeminy.  Pulmonary/Chest: Effort normal and breath sounds normal.  Neurological: She is alert and oriented to person,  place, and time.  Psychiatric: She has a normal mood and affect.  Vitals reviewed.  UC Treatments / Results  Labs (all labs ordered are listed, but only abnormal results are displayed) Labs Reviewed - No data to display  EKG  EKG Interpretation None       Radiology No results found.  Procedures Procedures (including critical care time)  Medications Ordered in UC Medications - No data to display   Initial Impression / Assessment and Plan / UC Course  I have reviewed the triage vital signs and the nursing notes.  Pertinent labs & imaging results that were available during my care of the patient were reviewed by me and considered in my medical decision making (see chart for details).    82 year old female presents with acute torticollis. Treating with baclofen. Supportive care/heat recommended.  Final Clinical Impressions(s) / UC Diagnoses   Final diagnoses:  Torticollis    New Prescriptions New Prescriptions   BACLOFEN (LIORESAL) 10 MG TABLET    Take 1 tablet (10 mg total) by mouth 3 (three) times daily as needed for muscle spasms.     Coral Spikes, DO 02/13/17 1000

## 2017-02-13 NOTE — Discharge Instructions (Signed)
Use the baclofen as needed.  Heating pad/hot shower/bath.  Take care  Dr. Lacinda Axon

## 2017-02-13 NOTE — ED Triage Notes (Signed)
Pt with right sided neck and shoulder stiffness, unable to turn head to the right. Pain 8/10 when trying to turn her head. No injury

## 2017-04-08 ENCOUNTER — Encounter: Payer: Medicare Other | Admitting: Internal Medicine

## 2017-04-15 ENCOUNTER — Other Ambulatory Visit: Payer: Self-pay | Admitting: Internal Medicine

## 2017-04-15 DIAGNOSIS — I10 Essential (primary) hypertension: Secondary | ICD-10-CM

## 2017-05-04 ENCOUNTER — Other Ambulatory Visit: Payer: Self-pay | Admitting: Internal Medicine

## 2017-05-04 DIAGNOSIS — I10 Essential (primary) hypertension: Secondary | ICD-10-CM

## 2017-05-25 ENCOUNTER — Encounter: Payer: Self-pay | Admitting: Internal Medicine

## 2017-05-25 ENCOUNTER — Ambulatory Visit (INDEPENDENT_AMBULATORY_CARE_PROVIDER_SITE_OTHER): Payer: Medicare Other | Admitting: Internal Medicine

## 2017-05-25 VITALS — BP 140/78 | HR 94 | Ht 68.25 in | Wt 136.0 lb

## 2017-05-25 DIAGNOSIS — K222 Esophageal obstruction: Secondary | ICD-10-CM | POA: Diagnosis not present

## 2017-05-25 DIAGNOSIS — Z1231 Encounter for screening mammogram for malignant neoplasm of breast: Secondary | ICD-10-CM

## 2017-05-25 DIAGNOSIS — I10 Essential (primary) hypertension: Secondary | ICD-10-CM

## 2017-05-25 DIAGNOSIS — Z0000 Encounter for general adult medical examination without abnormal findings: Secondary | ICD-10-CM | POA: Diagnosis not present

## 2017-05-25 LAB — POCT URINALYSIS DIPSTICK
Bilirubin, UA: NEGATIVE
Blood, UA: NEGATIVE
Glucose, UA: NEGATIVE
Ketones, UA: NEGATIVE
LEUKOCYTES UA: NEGATIVE
Nitrite, UA: NEGATIVE
PROTEIN UA: NEGATIVE
SPEC GRAV UA: 1.015 (ref 1.010–1.025)
UROBILINOGEN UA: 0.2 U/dL
pH, UA: 6.5 (ref 5.0–8.0)

## 2017-05-25 MED ORDER — OMEPRAZOLE 20 MG PO CPDR: 20.0000 mg | DELAYED_RELEASE_CAPSULE | Freq: Every day | ORAL | 3 refills | Status: DC

## 2017-05-25 MED ORDER — AMLODIPINE BESYLATE 10 MG PO TABS: 10.0000 mg | ORAL_TABLET | Freq: Every day | ORAL | 3 refills | Status: DC

## 2017-05-25 NOTE — Progress Notes (Signed)
Patient: Kendra Lane, Female    DOB: 1934-12-20, 82 y.o.   MRN: [id number] Visit Date: 05/25/2017  Today's Provider: Halina Maidens, MD   Chief Complaint  Patient presents with  . Medicare Wellness    Breast Exam. Declined flu shot.    Subjective:    Annual wellness visit Kendra Lane is a 82 y.o. female who presents today for her Subsequent Annual Wellness Visit. She feels well. She reports exercising walking 4 times a week. She reports she is sleeping fairly well. No breast problems, mammogram due in January. She declines influenza vaccine.  Pneumonia vaccines up to date.  ----------------------------------------------------------- Hypertension  This is a chronic problem. The problem is unchanged. The problem is controlled. Pertinent negatives include no chest pain, headaches, palpitations, peripheral edema or shortness of breath. There are no associated agents to hypertension. Past treatments include calcium channel blockers. The current treatment provides significant improvement. There are no compliance problems.   Gastroesophageal Reflux  She reports no abdominal pain, no chest pain, no coughing, no heartburn, no nausea, no sore throat, no water brash or no wheezing. The problem has been resolved. Pertinent negatives include no fatigue. She has tried a PPI for the symptoms. Past procedures include an EGD.    Review of Systems  Constitutional: Negative for chills, fatigue and fever.  HENT: Negative for congestion, hearing loss, sore throat, tinnitus, trouble swallowing and voice change.   Eyes: Negative for visual disturbance.  Respiratory: Negative for cough, chest tightness, shortness of breath and wheezing.   Cardiovascular: Negative for chest pain, palpitations and leg swelling.  Gastrointestinal: Negative for abdominal pain, constipation, diarrhea, heartburn, nausea and vomiting.  Endocrine: Negative for polydipsia and polyuria.  Genitourinary: Negative for  dysuria, frequency, genital sores, vaginal bleeding and vaginal discharge.  Musculoskeletal: Negative for arthralgias, gait problem and joint swelling.  Skin: Negative for color change and rash.  Neurological: Negative for dizziness, tremors, light-headedness and headaches.  Hematological: Negative for adenopathy. Does not bruise/bleed easily.  Psychiatric/Behavioral: Negative for dysphoric mood and sleep disturbance. The patient is not nervous/anxious.     Social History   Social History  . Marital status: Married    Spouse name: N/A  . Number of children: N/A  . Years of education: N/A   Occupational History  . Not on file.   Social History Main Topics  . Smoking status: Never Smoker  . Smokeless tobacco: Never Used  . Alcohol use 0.0 oz/week     Comment: 1 beer daily  . Drug use: No  . Sexual activity: Not on file   Other Topics Concern  . Not on file   Social History Narrative  . No narrative on file    Patient Active Problem List   Diagnosis Date Noted  . Bigeminy 04/07/2016  . Benign esophageal stricture 01/21/2015  . Essential (primary) hypertension 01/21/2015  . Hammer toe 01/21/2015  . Fatty tumor 01/21/2015    Past Surgical History:  Procedure Laterality Date  . ABDOMINAL HYSTERECTOMY    . ESOPHAGOGASTRODUODENOSCOPY (EGD) WITH ESOPHAGEAL DILATION  10/2014   Dr. Allen Norris  . TOTAL VAGINAL HYSTERECTOMY  1993    Her family history includes Breast cancer (age of onset: 22) in her daughter.     Previous Medications   AMLODIPINE (NORVASC) 10 MG TABLET    TAKE 1 TABLET(10 MG) BY MOUTH DAILY   ASPIRIN 81 MG TABLET    Take 81 mg by mouth daily.   BACLOFEN (LIORESAL) 10 MG TABLET  Take 1 tablet (10 mg total) by mouth 3 (three) times daily as needed for muscle spasms.   METOPROLOL SUCCINATE (TOPROL-XL) 25 MG 24 HR TABLET    TAKE 1 TABLET(25 MG) BY MOUTH DAILY   MULTIPLE VITAMINS-MINERALS (CENTRUM SILVER 50+WOMEN PO)    Take 1 tablet by mouth daily.   OMEPRAZOLE  (PRILOSEC) 20 MG CAPSULE    Take 1 capsule (20 mg total) by mouth daily.    Patient Care Team: Glean Hess, MD as PCP - General (Family Medicine)      Objective:   Vitals: BP 140/78   Pulse 94   Ht 5' 8.25" (1.734 m)   Wt 136 lb (61.7 kg)   SpO2 100%   BMI 20.53 kg/m   Physical Exam  Constitutional: She is oriented to person, place, and time. She appears well-developed and well-nourished. No distress.  HENT:  Head: Normocephalic and atraumatic.  Right Ear: Tympanic membrane and ear canal normal.  Left Ear: Tympanic membrane and ear canal normal.  Nose: Right sinus exhibits no maxillary sinus tenderness. Left sinus exhibits no maxillary sinus tenderness.  Mouth/Throat: Uvula is midline and oropharynx is clear and moist.  Eyes: Conjunctivae and EOM are normal. Right eye exhibits no discharge. Left eye exhibits no discharge. No scleral icterus.  Neck: Normal range of motion. Carotid bruit is not present. No erythema present. No thyromegaly present.  Cardiovascular: Normal rate, regular rhythm, normal heart sounds and normal pulses.   Pulmonary/Chest: Effort normal. No respiratory distress. She has no wheezes. Right breast exhibits no mass, no nipple discharge, no skin change and no tenderness. Left breast exhibits no mass, no nipple discharge, no skin change and no tenderness.  Mild fibrocystic changes in outer and upper left breast No skin change or nipple discharge  Abdominal: Soft. Bowel sounds are normal. There is no hepatosplenomegaly. There is no tenderness. There is no CVA tenderness.  Musculoskeletal: Normal range of motion.  Lymphadenopathy:    She has no cervical adenopathy.    She has no axillary adenopathy.  Neurological: She is alert and oriented to person, place, and time. She has normal reflexes. No cranial nerve deficit or sensory deficit.  Skin: Skin is warm, dry and intact. No rash noted.  Psychiatric: She has a normal mood and affect. Her speech is normal  and behavior is normal. Thought content normal.  Nursing note and vitals reviewed.   Activities of Daily Living In your present state of health, do you have any difficulty performing the following activities: 05/25/2017 05/25/2017  Hearing? N N  Vision? N N  Difficulty concentrating or making decisions? N N  Walking or climbing stairs? N N  Dressing or bathing? N N  Doing errands, shopping? N N  Preparing Food and eating ? N -  Using the Toilet? N -  In the past six months, have you accidently leaked urine? N -  Do you have problems with loss of bowel control? N -  Managing your Medications? N -  Managing your Finances? N -  Housekeeping or managing your Housekeeping? N -  Some recent data might be hidden    Fall Risk Assessment Fall Risk  05/25/2017 04/07/2016  Falls in the past year? No No     Depression Screen PHQ 2/9 Scores 05/25/2017 04/07/2016  PHQ - 2 Score 1 0    6CIT Screen 05/25/2017  What Year? 0 points  What month? 0 points  What time? 0 points  Count back from 20 0 points  Months in reverse 0 points  Repeat phrase 2 points  Total Score 2    Medicare Annual Wellness Visit Summary:  Reviewed patient's Family Medical History Reviewed and updated list of patient's medical providers Assessment of cognitive impairment was done Assessed patient's functional ability Established a written schedule for health screening [place] Completed and Reviewed  Exercise Activities and Dietary recommendations Goals    . Prevent Falls          Remain active and continue to prevent falls.        Immunization History  Administered Date(s) Administered  . Pneumococcal Conjugate-13 04/07/2016  . Pneumococcal Polysaccharide-23 06/26/2012    Health Maintenance  Topic Date Due  . DEXA SCAN  08/25/2002  . INFLUENZA VACCINE  06/13/2017 (Originally 04/13/2017)  . TETANUS/TDAP  09/13/2017 (Originally 08/25/1956)  . MAMMOGRAM  09/23/2017  . PNA vac Low  Risk Adult  Completed    Discussed health benefits of physical activity, and encouraged her to engage in regular exercise appropriate for her age and condition.    ------------------------------------------------------------------------------------------------------------  Assessment & Plan:   1. Medicare annual wellness visit, subsequent Measures satisfied Patient declines flu vaccine - Lipid panel - POCT urinalysis dipstick  2. Essential (primary) hypertension Controlled - continue current medication - amLODipine (NORVASC) 10 MG tablet; Take 1 tablet (10 mg total) by mouth daily.  Dispense: 90 tablet; Refill: 3 - Comprehensive metabolic panel - TSH  3. Benign esophageal stricture Continue daily PPI Sx controlled - omeprazole (PRILOSEC) 20 MG capsule; Take 1 capsule (20 mg total) by mouth daily.  Dispense: 90 capsule; Refill: 3 - CBC with Differential/Platelet  4. Encounter for screening mammogram for breast cancer - MM DIGITAL SCREENING BILATERAL; Future   Meds ordered this encounter  Medications  . omeprazole (PRILOSEC) 20 MG capsule    Sig: Take 1 capsule (20 mg total) by mouth daily.    Dispense:  90 capsule    Refill:  3  . amLODipine (NORVASC) 10 MG tablet    Sig: Take 1 tablet (10 mg total) by mouth daily.    Dispense:  90 tablet    Refill:  3    Partially dictated using Editor, commissioning. Any errors are unintentional.  Halina Maidens, MD [facility]  05/25/2017

## 2017-05-25 NOTE — Patient Instructions (Signed)
Health Maintenance  Topic Date Due  . DEXA SCAN  08/25/2002  . INFLUENZA VACCINE  06/13/2017 (Originally 04/13/2017)  . TETANUS/TDAP  09/13/2017 (Originally 08/25/1954)  . MAMMOGRAM  09/23/2017  . PNA vac Low Risk Adult  Completed    Breast Self-Awareness Breast self-awareness means being familiar with how your breasts look and feel. It involves checking your breasts regularly and reporting any changes to your health care provider. Practicing breast self-awareness is important. A change in your breasts can be a sign of a serious medical problem. Being familiar with how your breasts look and feel allows you to find any problems early, when treatment is more likely to be successful. All women should practice breast self-awareness, including women who have had breast implants. How to do a breast self-exam One way to learn what is normal for your breasts and whether your breasts are changing is to do a breast self-exam. To do a breast self-exam: Look for Changes  1. Remove all the clothing above your waist. 2. Stand in front of a mirror in a room with good lighting. 3. Put your hands on your hips. 4. Push your hands firmly downward. 5. Compare your breasts in the mirror. Look for differences between them (asymmetry), such as: ? Differences in shape. ? Differences in size. ? Puckers, dips, and bumps in one breast and not the other. 6. Look at each breast for changes in your skin, such as: ? Redness. ? Scaly areas. 7. Look for changes in your nipples, such as: ? Discharge. ? Bleeding. ? Dimpling. ? Redness. ? A change in position. Feel for Changes  Carefully feel your breasts for lumps and changes. It is best to do this while lying on your back on the floor and again while sitting or standing in the shower or tub with soapy water on your skin. Feel each breast in the following way:  Place the arm on the side of the breast you are examining above your head.  Feel your breast with the  other hand.  Start in the nipple area and make  inch (2 cm) overlapping circles to feel your breast. Use the pads of your three middle fingers to do this. Apply light pressure, then medium pressure, then firm pressure. The light pressure will allow you to feel the tissue closest to the skin. The medium pressure will allow you to feel the tissue that is a little deeper. The firm pressure will allow you to feel the tissue close to the ribs.  Continue the overlapping circles, moving downward over the breast until you feel your ribs below your breast.  Move one finger-width toward the center of the body. Continue to use the  inch (2 cm) overlapping circles to feel your breast as you move slowly up toward your collarbone.  Continue the up and down exam using all three pressures until you reach your armpit.  Write Down What You Find  Write down what is normal for each breast and any changes that you find. Keep a written record with breast changes or normal findings for each breast. By writing this information down, you do not need to depend only on memory for size, tenderness, or location. Write down where you are in your menstrual cycle, if you are still menstruating. If you are having trouble noticing differences in your breasts, do not get discouraged. With time you will become more familiar with the variations in your breasts and more comfortable with the exam. How often should I  examine my breasts? Examine your breasts every month. If you are breastfeeding, the best time to examine your breasts is after a feeding or after using a breast pump. If you menstruate, the best time to examine your breasts is 5-7 days after your period is over. During your period, your breasts are lumpier, and it may be more difficult to notice changes. When should I see my health care provider? See your health care provider if you notice:  A change in shape or size of your breasts or nipples.  A change in the skin of  your breast or nipples, such as a reddened or scaly area.  Unusual discharge from your nipples.  A lump or thick area that was not there before.  Pain in your breasts.  Anything that concerns you.  This information is not intended to replace advice given to you by your health care provider. Make sure you discuss any questions you have with your health care provider. Document Released: 08/30/2005 Document Revised: 02/05/2016 Document Reviewed: 07/20/2015 Elsevier Interactive Patient Education  [employer].

## 2017-05-26 LAB — CBC WITH DIFFERENTIAL/PLATELET
Basophils Absolute: 0 10*3/uL (ref 0.0–0.2)
Basos: 1 %
EOS (ABSOLUTE): 0 10*3/uL (ref 0.0–0.4)
Eos: 1 %
Hematocrit: 40.8 % (ref 34.0–46.6)
Hemoglobin: 13.7 g/dL (ref 11.1–15.9)
Immature Grans (Abs): 0 10*3/uL (ref 0.0–0.1)
Immature Granulocytes: 0 %
Lymphocytes Absolute: 1.9 10*3/uL (ref 0.7–3.1)
Lymphs: 33 %
MCH: 31 pg (ref 26.6–33.0)
MCHC: 33.6 g/dL (ref 31.5–35.7)
MCV: 92 fL (ref 79–97)
Monocytes Absolute: 0.6 10*3/uL (ref 0.1–0.9)
Monocytes: 10 %
Neutrophils Absolute: 3.2 10*3/uL (ref 1.4–7.0)
Neutrophils: 55 %
Platelets: 349 10*3/uL (ref 150–379)
RBC: 4.42 x10E6/uL (ref 3.77–5.28)
RDW: 13.2 % (ref 12.3–15.4)
WBC: 5.7 10*3/uL (ref 3.4–10.8)

## 2017-05-26 LAB — COMPREHENSIVE METABOLIC PANEL
A/G RATIO: 1.6 (ref 1.2–2.2)
ALT: 17 IU/L (ref 0–32)
AST: 21 IU/L (ref 0–40)
Albumin: 4.8 g/dL (ref 3.5–4.8)
Alkaline Phosphatase: 56 IU/L (ref 39–117)
BILIRUBIN TOTAL: 0.4 mg/dL (ref 0.0–1.2)
BUN/Creatinine Ratio: 19 (ref 12–28)
BUN: 12 mg/dL (ref 8–27)
CALCIUM: 9.6 mg/dL (ref 8.7–10.3)
CHLORIDE: 100 mmol/L (ref 96–106)
CO2: 24 mmol/L (ref 20–29)
Creatinine, Ser: 0.64 mg/dL (ref 0.57–1.00)
GFR, EST AFRICAN AMERICAN: 98 mL/min/{1.73_m2} (ref >59)
GFR, EST NON AFRICAN AMERICAN: 85 mL/min/{1.73_m2} (ref >59)
GLOBULIN, TOTAL: 3 g/dL (ref 1.5–4.5)
Glucose: 99 mg/dL (ref 65–99)
POTASSIUM: 3.8 mmol/L (ref 3.5–5.2)
SODIUM: 143 mmol/L (ref 134–144)
TOTAL PROTEIN: 7.8 g/dL (ref 6.0–8.5)

## 2017-05-26 LAB — LIPID PANEL
Chol/HDL Ratio: 1.9 ratio (ref 0.0–4.4)
Cholesterol, Total: 215 mg/dL — ABNORMAL HIGH (ref 100–199)
HDL: 115 mg/dL (ref >39)
LDL Calculated: 85 mg/dL (ref 0–99)
Triglycerides: 73 mg/dL (ref 0–149)
VLDL Cholesterol Cal: 15 mg/dL (ref 5–40)

## 2017-05-26 LAB — TSH: TSH: 1.11 u[IU]/mL (ref 0.450–4.500)

## 2017-05-27 ENCOUNTER — Encounter: Payer: Medicare Other | Admitting: Internal Medicine

## 2017-06-22 DIAGNOSIS — H25013 Cortical age-related cataract, bilateral: Secondary | ICD-10-CM | POA: Diagnosis not present

## 2017-07-13 ENCOUNTER — Encounter: Payer: Self-pay | Admitting: *Deleted

## 2017-07-13 ENCOUNTER — Ambulatory Visit (INDEPENDENT_AMBULATORY_CARE_PROVIDER_SITE_OTHER): Payer: Medicare Other

## 2017-07-13 ENCOUNTER — Ambulatory Visit
Admission: EM | Admit: 2017-07-13 | Discharge: 2017-07-13 | Disposition: A | Discharge status: Home / Self Care | Payer: Medicare Other | Attending: Family Medicine | Admitting: Family Medicine

## 2017-07-13 DIAGNOSIS — M25511 Pain in right shoulder: Secondary | ICD-10-CM

## 2017-07-13 DIAGNOSIS — M19011 Primary osteoarthritis, right shoulder: Secondary | ICD-10-CM | POA: Diagnosis not present

## 2017-07-13 DIAGNOSIS — M25512 Pain in left shoulder: Secondary | ICD-10-CM | POA: Diagnosis not present

## 2017-07-13 DIAGNOSIS — M19012 Primary osteoarthritis, left shoulder: Secondary | ICD-10-CM | POA: Diagnosis not present

## 2017-07-13 MED ORDER — NAPROXEN 500 MG PO TABS: 500.0000 mg | ORAL_TABLET | Freq: Two times a day (BID) | ORAL | 0 refills | Status: DC

## 2017-07-13 NOTE — ED Triage Notes (Signed)
Pt c/o pain to both shoulders. She has been having this problem over a month. Pt states pain "comes and goes"

## 2017-07-13 NOTE — ED Provider Notes (Signed)
[facility]    CSN: [id number] Arrival date & time: 07/13/17  1000     History   Chief Complaint Chief Complaint  Patient presents with  . Shoulder Pain    HPI Kendra Lane is a 82 y.o. female.   Patient is a 82 year old female who presents with complaint of bilateral shoulder pain with left being worse than the right off and on for about one month. Patient denies any injury or trauma to the shoulders. She doesn't remember anything that tends to make it worse but does report some occasional left elbow pain while driving. Patient denies any other arthritis or pain in any other joints. She will take ibuprofen on occasion on aspirin for her pain. She is not allergic to any medications.      Past Medical History:  Diagnosis Date  . GERD (gastroesophageal reflux disease)   . Hypertension     Patient Active Problem List   Diagnosis Date Noted  . Bigeminy 04/07/2016  . Benign esophageal stricture 01/21/2015  . Essential (primary) hypertension 01/21/2015  . Hammer toe 01/21/2015  . Fatty tumor 01/21/2015    Past Surgical History:  Procedure Laterality Date  . ABDOMINAL HYSTERECTOMY    . ESOPHAGOGASTRODUODENOSCOPY (EGD) WITH ESOPHAGEAL DILATION  10/2014   Dr. Allen Norris  . TOTAL VAGINAL HYSTERECTOMY  1993    OB History    No data available       Home Medications    Prior to Admission medications   Medication Sig Start Date End Date Taking? Authorizing Provider  amLODipine (NORVASC) 10 MG tablet Take 1 tablet (10 mg total) by mouth daily. 05/25/17  Yes Glean Hess, MD  aspirin 81 MG tablet Take 81 mg by mouth daily.   Yes [provider]  metoprolol succinate (TOPROL-XL) 25 MG 24 hr tablet TAKE 1 TABLET(25 MG) BY MOUTH DAILY 05/04/17  Yes Glean Hess, MD  Multiple Vitamins-Minerals (CENTRUM SILVER 50+WOMEN PO) Take 1 tablet by mouth daily.   Yes [provider]  omeprazole (PRILOSEC) 20 MG capsule Take 1 capsule (20 mg total)  by mouth daily. 05/25/17  Yes Glean Hess, MD  baclofen (LIORESAL) 10 MG tablet Take 1 tablet (10 mg total) by mouth 3 (three) times daily as needed for muscle spasms. 02/13/17   Coral Spikes, DO  naproxen (NAPROSYN) 500 MG tablet Take 1 tablet (500 mg total) by mouth 2 (two) times daily. 07/13/17   Luvenia Redden, PA-C    Family History Family History  Problem Relation Age of Onset  . Breast cancer Daughter 69    Social History Social History  Substance Use Topics  . Smoking status: Never Smoker  . Smokeless tobacco: Never Used  . Alcohol use 0.0 oz/week     Comment: 1 beer daily     Allergies   Patient has no known allergies.   Review of Systems Review of Systems   Physical Exam Triage Vital Signs ED Triage Vitals  Enc Vitals Group     BP 07/13/17 1013 (!) 181/79     Pulse Rate 07/13/17 1013 83     Resp 07/13/17 1013 12     Temp 07/13/17 1013 97.8 F (36.6 C)     Temp Source 07/13/17 1013 Oral     SpO2 07/13/17 1013 100 %     Weight 07/13/17 1016 145 lb (65.8 kg)     Height 07/13/17 1016 5\' 4"  (1.626 m)     Head Circumference --  Peak Flow --      Pain Score 07/13/17 1017 4     Pain Loc --      Pain Edu? --      Excl. in [place]? --    No data found.   Updated Vital Signs BP (!) 181/79 (BP Location: Left Arm)   Pulse 83   Temp 97.8 F (36.6 C) (Oral)   Resp 12   Ht 5\' 4"  (1.626 m)   Wt 145 lb (65.8 kg)   SpO2 100%   BMI 24.89 kg/m    Physical Exam  Constitutional: She is oriented to person, place, and time. She appears well-developed and well-nourished. No distress.  Eyes: Pupils are equal, round, and reactive to light. EOM are normal.  Pulmonary/Chest: Effort normal and breath sounds normal. No respiratory distress.  Musculoskeletal:       Right shoulder: She exhibits tenderness and crepitus (more of a friction rub type of feel). She exhibits normal range of motion and normal strength.       Left shoulder: She exhibits tenderness and  crepitus (more of a friction rub type of feel). She exhibits normal range of motion, no deformity and normal strength.  Neurological: She is alert and oriented to person, place, and time. No cranial nerve deficit.     UC Treatments / Results  Labs (all labs ordered are listed, but only abnormal results are displayed) Labs Reviewed - No data to display  EKG  EKG Interpretation None       Radiology Dg Shoulder Right  Result Date: 07/13/2017 CLINICAL DATA:  Bilateral shoulder pain for 3-4 weeks. No known injury. EXAM: RIGHT SHOULDER - 2+ VIEW COMPARISON:  None. FINDINGS: Degenerative changes in the [facility] joint with joint space narrowing and spurring. Glenohumeral joint is maintained. No acute bony abnormality. Specifically, no fracture, subluxation, or dislocation. Soft tissues are intact. IMPRESSION: Degenerative changes in the right AC joint. No acute bony abnormality. Electronically Signed   By: Rolm Baptise M.D.   On: 07/13/2017 10:50   Dg Shoulder Left  Result Date: 07/13/2017 CLINICAL DATA:  Pain for several weeks EXAM: LEFT SHOULDER - 2+ VIEW COMPARISON:  None. FINDINGS: Oblique, Y scapular, and axillary images were obtained. There is no evident fracture or dislocation. There is slight acromioclavicular joint space narrowing. Glenohumeral joint appears normal. No erosive change or intra-articular calcification. Visualized left lung clear.  There is aortic atherosclerosis. IMPRESSION: Mild narrowing acromioclavicular joint. Glenohumeral joint appears unremarkable. No fracture or dislocation. There is aortic atherosclerosis. Aortic Atherosclerosis (ICD10-I70.0). Electronically Signed   By: Lowella Grip III M.D.   On: 07/13/2017 10:50    Procedures Procedures (including critical care time)  Medications Ordered in UC Medications - No data to display   Initial Impression / Assessment and Plan / UC Course  I have reviewed the triage vital signs and the nursing notes.  Pertinent  labs & imaging results that were available during my care of the patient were reviewed by me and considered in my medical decision making (see chart for details).     Patient with approximately 1 month of intermittent bilateral shoulder pain, left greater than right. Patient was tenderness but full range of motion. Some friction rub sensation of the joint with movement is palpable. Check an x-ray.  Final Clinical Impressions(s) / UC Diagnoses   Final diagnoses:  Bilateral shoulder pain, unspecified chronicity   Bilateral shoulder pain with x-ray as above with some degenerative changes, actually worse on the right. Give her  prescriptions for naproxen as well as shoulder exercises. Follow-up with her PCP if no improvement as she may need some physical therapy.  New Prescriptions New Prescriptions   NAPROXEN (NAPROSYN) 500 MG TABLET    Take 1 tablet (500 mg total) by mouth 2 (two) times daily.     Controlled Substance Prescriptions [place] Controlled Substance Registry consulted? Not Applicable   Luvenia Redden, PA-C 07/13/17 1120

## 2017-07-13 NOTE — Discharge Instructions (Signed)
-  some narrowing of one of the shoulder joints, more on the right -Naproxen: one tablet twice a day as needed for pain -shoulder exercises as attached -if no improvement, follow up with PCP as physical therapy may be beneficial.

## 2017-09-26 ENCOUNTER — Ambulatory Visit: Payer: Medicare Other

## 2017-09-29 ENCOUNTER — Ambulatory Visit
Admission: RE | Admit: 2017-09-29 | Discharge: 2017-09-29 | Disposition: A | Discharge status: Home / Self Care | Payer: Medicare Other | Source: Ambulatory Visit | Attending: Internal Medicine | Admitting: Internal Medicine

## 2017-09-29 DIAGNOSIS — Z1231 Encounter for screening mammogram for malignant neoplasm of breast: Secondary | ICD-10-CM | POA: Insufficient documentation

## 2017-11-30 ENCOUNTER — Ambulatory Visit (INDEPENDENT_AMBULATORY_CARE_PROVIDER_SITE_OTHER): Payer: Medicare Other | Admitting: Internal Medicine

## 2017-11-30 ENCOUNTER — Encounter: Payer: Self-pay | Admitting: Internal Medicine

## 2017-11-30 VITALS — BP 142/74 | HR 90 | Ht 64.0 in | Wt 136.8 lb

## 2017-11-30 DIAGNOSIS — K222 Esophageal obstruction: Secondary | ICD-10-CM | POA: Diagnosis not present

## 2017-11-30 DIAGNOSIS — I10 Essential (primary) hypertension: Secondary | ICD-10-CM

## 2017-11-30 DIAGNOSIS — I499 Cardiac arrhythmia, unspecified: Secondary | ICD-10-CM

## 2017-11-30 DIAGNOSIS — M19019 Primary osteoarthritis, unspecified shoulder: Secondary | ICD-10-CM | POA: Diagnosis not present

## 2017-11-30 DIAGNOSIS — M2041 Other hammer toe(s) (acquired), right foot: Secondary | ICD-10-CM | POA: Diagnosis not present

## 2017-11-30 DIAGNOSIS — I498 Other specified cardiac arrhythmias: Secondary | ICD-10-CM

## 2017-11-30 DIAGNOSIS — M2042 Other hammer toe(s) (acquired), left foot: Secondary | ICD-10-CM | POA: Diagnosis not present

## 2017-11-30 NOTE — Progress Notes (Signed)
Date:  11/30/2017   Name:  Kendra Lane   DOB:  1935-08-09   MRN:  [id number]   Chief Complaint: Hypertension Hypertension  This is a chronic problem. Pertinent negatives include no chest pain, headaches, palpitations or shortness of breath.  Gastroesophageal Reflux  She complains of heartburn. She reports no abdominal pain, no chest pain or no coughing. The problem occurs rarely. Pertinent negatives include no fatigue. She has tried a PPI for the symptoms.  Shoulder Pain   The pain is present in the left shoulder and right shoulder. This is a chronic problem. The current episode started more than 1 year ago. The problem occurs daily. The quality of the pain is described as aching. The pain is mild. Associated symptoms include a limited range of motion. Pertinent negatives include no fever or numbness. The symptoms are aggravated by activity. She has tried acetaminophen for the symptoms. The treatment provided mild relief.   Thickened toenails - she has very thick long nails on her toes and has trouble wearing shoes.  She has never seen a podiatrist.   Review of Systems  Constitutional: Negative for appetite change, fatigue, fever and unexpected weight change.  HENT: Negative for tinnitus and trouble swallowing.   Eyes: Negative for visual disturbance.  Respiratory: Negative for cough, chest tightness and shortness of breath.   Cardiovascular: Negative for chest pain, palpitations and leg swelling.  Gastrointestinal: Positive for heartburn. Negative for abdominal pain.  Endocrine: Negative for polydipsia and polyuria.  Genitourinary: Negative for dysuria.  Musculoskeletal: Positive for arthralgias.  Skin: Negative for color change and rash.  Neurological: Negative for dizziness, tremors, numbness and headaches.  Hematological: Negative for adenopathy. Does not bruise/bleed easily.  Psychiatric/Behavioral: Positive for sleep disturbance (possibly due to shoulder discomfort).  Negative for dysphoric mood. The patient is not nervous/anxious.     Patient Active Problem List   Diagnosis Date Noted  . Bigeminy 04/07/2016  . Benign esophageal stricture 01/21/2015  . Essential (primary) hypertension 01/21/2015  . Hammer toe 01/21/2015  . Fatty tumor 01/21/2015    Prior to Admission medications   Medication Sig Start Date End Date Taking? Authorizing Provider  amLODipine (NORVASC) 10 MG tablet Take 1 tablet (10 mg total) by mouth daily. 05/25/17  Yes Glean Hess, MD  aspirin 81 MG tablet Take 81 mg by mouth daily.   Yes [provider]  metoprolol succinate (TOPROL-XL) 25 MG 24 hr tablet TAKE 1 TABLET(25 MG) BY MOUTH DAILY 05/04/17  Yes Glean Hess, MD  Multiple Vitamins-Minerals (CENTRUM SILVER 50+WOMEN PO) Take 1 tablet by mouth daily.   Yes [provider]  naproxen (NAPROSYN) 500 MG tablet Take 1 tablet (500 mg total) by mouth 2 (two) times daily. 07/13/17  Yes Luvenia Redden, PA-C  omeprazole (PRILOSEC) 20 MG capsule Take 1 capsule (20 mg total) by mouth daily. 05/25/17  Yes Glean Hess, MD  baclofen (LIORESAL) 10 MG tablet Take 1 tablet (10 mg total) by mouth 3 (three) times daily as needed for muscle spasms. Patient not taking: Reported on 11/30/2017 02/13/17   Coral Spikes, DO    No Known Allergies  Past Surgical History:  Procedure Laterality Date  . ABDOMINAL HYSTERECTOMY    . ESOPHAGOGASTRODUODENOSCOPY (EGD) WITH ESOPHAGEAL DILATION  10/2014   Dr. Allen Norris  . TOTAL VAGINAL HYSTERECTOMY  1993    Social History   Tobacco Use  . Smoking status: Never Smoker  . Smokeless tobacco: Never Used  Substance Use  Topics  . Alcohol use: Yes    Alcohol/week: 0.0 oz    Comment: 1 beer daily  . Drug use: No     Medication list has been reviewed and updated.  PHQ 2/9 Scores 05/25/2017 04/07/2016  PHQ - 2 Score 1 0    Physical Exam  Constitutional: She is oriented to person, place, and time. She appears well-developed. No  distress.  HENT:  Head: Normocephalic and atraumatic.  Neck: Normal range of motion. Neck supple. Carotid bruit is not present.  Cardiovascular: Normal rate, regular rhythm and normal heart sounds. Frequent extrasystoles are present.  Pulmonary/Chest: Effort normal and breath sounds normal. No respiratory distress. She has no wheezes. She has no rhonchi.  Musculoskeletal:       Right shoulder: She exhibits decreased range of motion.       Left shoulder: She exhibits decreased range of motion.       Feet:  Neurological: She is alert and oriented to person, place, and time.  Skin: Skin is warm and dry. No rash noted.  Psychiatric: She has a normal mood and affect. Her behavior is normal. Thought content normal.  Nursing note and vitals reviewed.   BP (!) 142/74   Pulse 90   Ht 5\' 4"  (1.626 m)   Wt 136 lb 12.8 oz (62.1 kg)   SpO2 100%   BMI 23.48 kg/m   Assessment and Plan: 1. Essential (primary) hypertension controlled  2. Bigeminy stable  3. Benign esophageal stricture Doing well with no sx on PPI  4. Hammer toes of both feet - Ambulatory referral to Podiatry  5. Arthritis of shoulder Take tylenol tid and at bedtime  No orders of the defined types were placed in this encounter.   Partially dictated using Editor, commissioning. Any errors are unintentional.  Halina Maidens, MD [facility]  11/30/2017

## 2017-11-30 NOTE — Patient Instructions (Signed)
Take one or two Tylenol three times a day as needed for shoulder pain.

## 2017-12-07 DIAGNOSIS — M2041 Other hammer toe(s) (acquired), right foot: Secondary | ICD-10-CM | POA: Diagnosis not present

## 2017-12-07 DIAGNOSIS — M79674 Pain in right toe(s): Secondary | ICD-10-CM | POA: Diagnosis not present

## 2017-12-07 DIAGNOSIS — B351 Tinea unguium: Secondary | ICD-10-CM | POA: Diagnosis not present

## 2018-01-03 ENCOUNTER — Ambulatory Visit
Admission: EM | Admit: 2018-01-03 | Discharge: 2018-01-03 | Disposition: A | Discharge status: Home / Self Care | Payer: Medicare Other | Attending: Family Medicine | Admitting: Family Medicine

## 2018-01-03 DIAGNOSIS — S8002XA Contusion of left knee, initial encounter: Secondary | ICD-10-CM | POA: Diagnosis not present

## 2018-01-03 DIAGNOSIS — M25562 Pain in left knee: Secondary | ICD-10-CM | POA: Diagnosis not present

## 2018-01-03 DIAGNOSIS — S8000XA Contusion of unspecified knee, initial encounter: Secondary | ICD-10-CM

## 2018-01-03 DIAGNOSIS — W19XXXA Unspecified fall, initial encounter: Secondary | ICD-10-CM | POA: Diagnosis not present

## 2018-01-03 MED ORDER — NAPROXEN 500 MG PO TABS: 500.0000 mg | ORAL_TABLET | Freq: Two times a day (BID) | ORAL | 0 refills | Status: DC

## 2018-01-03 NOTE — Discharge Instructions (Signed)
Apply ice 20 minutes out of every 2 hours 4-5 times daily for comfort.Ace wrap as necessary for comfort.

## 2018-01-03 NOTE — ED Provider Notes (Addendum)
[facility]    CSN: [id number] Arrival date & time: 01/03/18  1005     History   Chief Complaint Chief Complaint  Patient presents with  . Knee Pain    HPI Kendra Lane is a 83 y.o. female.   HPI  83 year-old female was pushing her recycling bin yesterday she was going up an incline when the wheels raised forward and she fell onto the open lid ( plastic) on soft ground.  That time she has had medial knee pain bilaterally but far worse on the left.  She was in pain last night during sleep.  She walks with a cane for assistance and able to bear weight.  She took ibuprofen last night but did not seem to be strong enough for her pain.         Past Medical History:  Diagnosis Date  . GERD (gastroesophageal reflux disease)   . Hypertension     Patient Active Problem List   Diagnosis Date Noted  . Arthritis of shoulder 11/30/2017  . Bigeminy 04/07/2016  . Benign esophageal stricture 01/21/2015  . Essential (primary) hypertension 01/21/2015  . Hammer toe 01/21/2015  . Fatty tumor 01/21/2015    Past Surgical History:  Procedure Laterality Date  . ABDOMINAL HYSTERECTOMY    . ESOPHAGOGASTRODUODENOSCOPY (EGD) WITH ESOPHAGEAL DILATION  10/2014   Dr. Allen Norris  . TOTAL VAGINAL HYSTERECTOMY  1993    OB History   None      Home Medications    Prior to Admission medications   Medication Sig Start Date End Date Taking? Authorizing Provider  amLODipine (NORVASC) 10 MG tablet Take 1 tablet (10 mg total) by mouth daily. 05/25/17   Glean Hess, MD  aspirin 81 MG tablet Take 81 mg by mouth daily.    [provider]  metoprolol succinate (TOPROL-XL) 25 MG 24 hr tablet TAKE 1 TABLET(25 MG) BY MOUTH DAILY 05/04/17   Glean Hess, MD  Multiple Vitamins-Minerals (CENTRUM SILVER 50+WOMEN PO) Take 1 tablet by mouth daily.    [provider]  naproxen (NAPROSYN) 500 MG tablet Take 1 tablet (500 mg total) by mouth 2 (two) times daily. 01/03/18    Lorin Picket, PA-C  omeprazole (PRILOSEC) 20 MG capsule Take 1 capsule (20 mg total) by mouth daily. 05/25/17   Glean Hess, MD    Family History Family History  Problem Relation Age of Onset  . Breast cancer Daughter 30    Social History Social History   Tobacco Use  . Smoking status: Never Smoker  . Smokeless tobacco: Never Used  Substance Use Topics  . Alcohol use: Yes    Alcohol/week: 0.0 oz    Comment: 1-2 beer daily  . Drug use: No     Allergies   Patient has no known allergies.   Review of Systems Review of Systems  Constitutional: Positive for activity change. Negative for appetite change, chills and fever.  Musculoskeletal: Positive for myalgias.  All other systems reviewed and are negative.    Physical Exam Triage Vital Signs ED Triage Vitals  Enc Vitals Group     BP 01/03/18 1009 (!) 155/79     Pulse Rate 01/03/18 1009 85     Resp 01/03/18 1009 16     Temp 01/03/18 1009 97.8 F (36.6 C)     Temp Source 01/03/18 1009 Oral     SpO2 01/03/18 1009 99 %     Weight 01/03/18 1008 138 lb (62.6 kg)  Height 01/03/18 1008 5\' 8"  (1.727 m)     Head Circumference --      Peak Flow --      Pain Score 01/03/18 1008 8     Pain Loc --      Pain Edu? --      Excl. in [place]? --    No data found.  Updated Vital Signs BP (!) 155/79 (BP Location: Right Arm)   Pulse 85   Temp 97.8 F (36.6 C) (Oral)   Resp 16   Ht 5\' 8"  (1.727 m)   Wt 138 lb (62.6 kg)   SpO2 99%   BMI 20.98 kg/m   Visual Acuity Right Eye Distance:   Left Eye Distance:   Bilateral Distance:    Right Eye Near:   Left Eye Near:    Bilateral Near:     Physical Exam  Constitutional: She is oriented to person, place, and time. She appears well-developed and well-nourished. No distress.  HENT:  Head: Normocephalic.  Eyes: Pupils are equal, round, and reactive to light.  Neck: Normal range of motion.  Musculoskeletal: Normal range of motion. She exhibits tenderness.    Examination of the knees shows on the left knee ecchymotic area just inferior to the tibial plateau aspect.  Is tender to the touch.  There is no crepitus auction once or induration present.  Full range of motion of her knee.  She has no patellar or retropatellar tenderness.  Collateral ligaments are intact to stressing.  She is a negative anterior drawer sign.  Examination of the right knee shows no ecchymotic areas.  She has full range of motion.  It is essentially a negative exam except for some mild medial heel pain just inferior to the plateau.  Neurological: She is oriented to person, place, and time.  Skin: Skin is warm and dry. She is not diaphoretic.  Psychiatric: She has a normal mood and affect. Her behavior is normal. Judgment and thought content normal.  Nursing note and vitals reviewed.    UC Treatments / Results  Labs (all labs ordered are listed, but only abnormal results are displayed) Labs Reviewed - No data to display  EKG None Radiology No results found.  Procedures Procedures (including critical care time)  Medications Ordered in UC Medications - No data to display   Initial Impression / Assessment and Plan / UC Course  I have reviewed the triage vital signs and the nursing notes.  Pertinent labs & imaging results that were available during my care of the patient were reviewed by me and considered in my medical decision making (see chart for details).     Plan: 1. Test/x-ray results and diagnosis reviewed with patient 2. rx as per orders; risks, benefits, potential side effects reviewed with patient 3. Recommend supportive treatment with Apply ice 20 minutes out of every 2 hours 4-5 times daily for comfort.Ace wrap was applied to the left knee which she will use for comfort.  He continues to have problems she should follow-up with her primary care physician or orthopedic surgery. 4. F/u prn if symptoms worsen or don't improve   Final Clinical  Impressions(s) / UC Diagnoses   Final diagnoses:  Contusion of knee, unspecified laterality, initial encounter    ED Discharge Orders        Ordered    naproxen (NAPROSYN) 500 MG tablet  2 times daily     01/03/18 1105       Controlled Substance Prescriptions [place] Controlled Substance  Registry consulted? Not Applicable  Lorin Picket, PA-C 01/03/18 1124  Lorin Picket, PA-C 01/03/18 1125

## 2018-01-03 NOTE — ED Notes (Signed)
Ace wrap to left knee.

## 2018-01-03 NOTE — ED Triage Notes (Signed)
Pt reports she was pushing her recycling can yesterday and it went to the ground and she fell on her knees on the inside of the can.

## 2018-04-07 ENCOUNTER — Ambulatory Visit
Admission: RE | Admit: 2018-04-07 | Discharge: 2018-04-07 | Disposition: A | Discharge status: Home / Self Care | Payer: Medicare Other | Source: Ambulatory Visit | Attending: Family Medicine | Admitting: Family Medicine

## 2018-04-07 ENCOUNTER — Ambulatory Visit
Admission: EM | Admit: 2018-04-07 | Discharge: 2018-04-07 | Disposition: A | Discharge status: Home / Self Care | Payer: Medicare Other | Attending: Family Medicine | Admitting: Family Medicine

## 2018-04-07 DIAGNOSIS — M79661 Pain in right lower leg: Secondary | ICD-10-CM | POA: Diagnosis not present

## 2018-04-07 DIAGNOSIS — M7989 Other specified soft tissue disorders: Secondary | ICD-10-CM | POA: Diagnosis not present

## 2018-04-07 DIAGNOSIS — M25561 Pain in right knee: Secondary | ICD-10-CM

## 2018-04-07 NOTE — Discharge Instructions (Signed)
Ultrasound Tylenol/advil as needed

## 2018-04-07 NOTE — ED Provider Notes (Signed)
[facility]    CSN: [id number] Arrival date & time: 04/07/18  1056     History   Chief Complaint Chief Complaint  Patient presents with  . Knee Pain    HPI Kendra Lane is a 83 y.o. female.   83 yo female with a c/o right knee pain in the back of the knee and calf for the past week. Denies any injuries, falls, redness, fevers, chills, swelling.   The history is provided by the patient.  Knee Pain    Past Medical History:  Diagnosis Date  . GERD (gastroesophageal reflux disease)   . Hypertension     Patient Active Problem List   Diagnosis Date Noted  . Arthritis of shoulder 11/30/2017  . Bigeminy 04/07/2016  . Benign esophageal stricture 01/21/2015  . Essential (primary) hypertension 01/21/2015  . Hammer toe 01/21/2015  . Fatty tumor 01/21/2015    Past Surgical History:  Procedure Laterality Date  . ABDOMINAL HYSTERECTOMY    . ESOPHAGOGASTRODUODENOSCOPY (EGD) WITH ESOPHAGEAL DILATION  10/2014   Dr. Allen Norris  . TOTAL VAGINAL HYSTERECTOMY  1993    OB History   None      Home Medications    Prior to Admission medications   Medication Sig Start Date End Date Taking? Authorizing Provider  amLODipine (NORVASC) 10 MG tablet Take 1 tablet (10 mg total) by mouth daily. 05/25/17  Yes Glean Hess, MD  aspirin 81 MG tablet Take 81 mg by mouth daily.   Yes [provider]  metoprolol succinate (TOPROL-XL) 25 MG 24 hr tablet TAKE 1 TABLET(25 MG) BY MOUTH DAILY 05/04/17  Yes Glean Hess, MD  Multiple Vitamins-Minerals (CENTRUM SILVER 50+WOMEN PO) Take 1 tablet by mouth daily.   Yes [provider]  naproxen (NAPROSYN) 500 MG tablet Take 1 tablet (500 mg total) by mouth 2 (two) times daily. 01/03/18  Yes Lorin Picket, PA-C  omeprazole (PRILOSEC) 20 MG capsule Take 1 capsule (20 mg total) by mouth daily. 05/25/17  Yes Glean Hess, MD    Family History Family History  Problem Relation Age of Onset  . Breast cancer  Daughter 16    Social History Social History   Tobacco Use  . Smoking status: Never Smoker  . Smokeless tobacco: Never Used  Substance Use Topics  . Alcohol use: Yes    Alcohol/week: 0.0 oz    Comment: 1-2 beer daily  . Drug use: No     Allergies   Patient has no known allergies.   Review of Systems Review of Systems   Physical Exam Triage Vital Signs ED Triage Vitals  Enc Vitals Group     BP 04/07/18 1108 (!) 168/82     Pulse Rate 04/07/18 1108 (!) 106     Resp 04/07/18 1108 16     Temp 04/07/18 1108 98.5 F (36.9 C)     Temp Source 04/07/18 1108 Oral     SpO2 04/07/18 1108 100 %     Weight 04/07/18 1107 135 lb (61.2 kg)     Height 04/07/18 1107 5\' 7"  (1.702 m)     Head Circumference --      Peak Flow --      Pain Score 04/07/18 1106 2     Pain Loc --      Pain Edu? --      Excl. in [place]? --    No data found.  Updated Vital Signs BP (!) 168/82 (BP Location: Left Arm)  Pulse (!) 106   Temp 98.5 F (36.9 C) (Oral)   Resp 16   Ht 5\' 7"  (1.702 m)   Wt 135 lb (61.2 kg)   SpO2 100%   BMI 21.14 kg/m   Visual Acuity Right Eye Distance:   Left Eye Distance:   Bilateral Distance:    Right Eye Near:   Left Eye Near:    Bilateral Near:     Physical Exam  Constitutional: She appears well-developed and well-nourished. No distress.  Musculoskeletal:       Right knee: She exhibits normal range of motion, no swelling, no effusion, no ecchymosis, no deformity, no laceration, no erythema, normal alignment, normal patellar mobility, no bony tenderness, normal meniscus and no MCL laxity. Tenderness (posterior knee) found.  Right calf tenderness to palpation  Skin: She is not diaphoretic.  Nursing note and vitals reviewed.    UC Treatments / Results  Labs (all labs ordered are listed, but only abnormal results are displayed) Labs Reviewed - No data to display  EKG None  Radiology No results found.  Procedures Procedures (including critical care  time)  Medications Ordered in UC Medications - No data to display  Initial Impression / Assessment and Plan / UC Course  I have reviewed the triage vital signs and the nursing notes.  Pertinent labs & imaging results that were available during my care of the patient were reviewed by me and considered in my medical decision making (see chart for details).      Final Clinical Impressions(s) / UC Diagnoses   Final diagnoses:  Right calf pain  Acute pain of right knee  (possible musculotendinous vs vascular/venous)   Discharge Instructions     Ultrasound Tylenol/advil as needed    ED Prescriptions    None     1. possible diagnosis reviewed with patient 2. Recommend lower ext [place] to rule out DVT; further management pending [place] result 3. Recommend supportive treatment with otc analgesics 4. Follow-up prn if symptoms worsen or don't improve   Controlled Substance Prescriptions [place] Controlled Substance Registry consulted? Not Applicable   Norval Gable, MD 04/07/18 1255

## 2018-04-07 NOTE — ED Triage Notes (Signed)
As per patient Right leg pain with ROM onset week.

## 2018-04-27 ENCOUNTER — Other Ambulatory Visit: Payer: Self-pay | Admitting: Internal Medicine

## 2018-04-27 DIAGNOSIS — I10 Essential (primary) hypertension: Secondary | ICD-10-CM

## 2018-05-01 ENCOUNTER — Ambulatory Visit (INDEPENDENT_AMBULATORY_CARE_PROVIDER_SITE_OTHER): Payer: Medicare Other | Admitting: Internal Medicine

## 2018-05-01 ENCOUNTER — Encounter: Payer: Self-pay | Admitting: Internal Medicine

## 2018-05-01 VITALS — BP 126/70 | HR 98 | Resp 16 | Ht 67.0 in | Wt 138.0 lb

## 2018-05-01 DIAGNOSIS — M7989 Other specified soft tissue disorders: Secondary | ICD-10-CM | POA: Diagnosis not present

## 2018-05-01 DIAGNOSIS — I10 Essential (primary) hypertension: Secondary | ICD-10-CM

## 2018-05-01 DIAGNOSIS — R002 Palpitations: Secondary | ICD-10-CM

## 2018-05-01 DIAGNOSIS — K222 Esophageal obstruction: Secondary | ICD-10-CM | POA: Diagnosis not present

## 2018-05-01 NOTE — Progress Notes (Addendum)
Date:  05/01/2018   Name:  Kendra Lane   DOB:  06/30/1935   MRN:  [id number]   Chief Complaint: Leg Discomfort (Right leg discomfort is much better from last month when she seen UC. Swelling and tightness is down. Pain is only present when she stands up. Otherwise no pain. )  Hypertension  This is a chronic problem. The problem is controlled. Associated symptoms include chest pain and palpitations. Pertinent negatives include no headaches or shortness of breath. Past treatments include beta blockers and calcium channel blockers. The current treatment provides significant improvement. There are no compliance problems.   Gastroesophageal Reflux  She complains of chest pain. She reports no abdominal pain, no coughing or no wheezing. This is a chronic problem. The problem occurs rarely. Pertinent negatives include no fatigue. She has tried a PPI for the symptoms.  Knee pain/leg swelling - seen in ED a month ago.  [place] negative for DVT.  Doing better now with minimal discomfort and no swelling.    Review of Systems  Constitutional: Negative for chills, fatigue and fever.  Respiratory: Negative for cough, chest tightness, shortness of breath and wheezing.   Cardiovascular: Positive for chest pain and palpitations. Negative for leg swelling.  Gastrointestinal: Negative for abdominal pain.  Musculoskeletal: Negative for arthralgias, gait problem, joint swelling and myalgias.  Neurological: Negative for dizziness, tremors, light-headedness and headaches.    Patient Active Problem List   Diagnosis Date Noted  . Arthritis of shoulder 11/30/2017  . Bigeminy 04/07/2016  . Benign esophageal stricture 01/21/2015  . Essential (primary) hypertension 01/21/2015  . Hammer toe 01/21/2015  . Fatty tumor 01/21/2015    No Known Allergies  Past Surgical History:  Procedure Laterality Date  . ABDOMINAL HYSTERECTOMY    . ESOPHAGOGASTRODUODENOSCOPY (EGD) WITH ESOPHAGEAL DILATION  10/2014   Dr.  Allen Norris  . TOTAL VAGINAL HYSTERECTOMY  1993    Social History   Tobacco Use  . Smoking status: Never Smoker  . Smokeless tobacco: Never Used  Substance Use Topics  . Alcohol use: Yes    Alcohol/week: 0.0 standard drinks    Comment: 1-2 beer daily  . Drug use: No     Medication list has been reviewed and updated.  Current Meds  Medication Sig  . amLODipine (NORVASC) 10 MG tablet Take 1 tablet (10 mg total) by mouth daily.  Marland Kitchen aspirin 81 MG tablet Take 81 mg by mouth daily.  . metoprolol succinate (TOPROL-XL) 25 MG 24 hr tablet TAKE 1 TABLET(25 MG) BY MOUTH DAILY  . Multiple Vitamins-Minerals (CENTRUM SILVER 50+WOMEN PO) Take 1 tablet by mouth daily.  Marland Kitchen omeprazole (PRILOSEC) 20 MG capsule Take 1 capsule (20 mg total) by mouth daily.    PHQ 2/9 Scores 05/25/2017 04/07/2016  PHQ - 2 Score 1 0    Physical Exam  Constitutional: She is oriented to person, place, and time. She appears well-developed. No distress.  HENT:  Head: Normocephalic and atraumatic.  Cardiovascular: Normal rate, regular rhythm, normal heart sounds and normal pulses. Frequent extrasystoles are present. Exam reveals no gallop.  No murmur heard. Pulmonary/Chest: Effort normal. No respiratory distress.  Musculoskeletal: Normal range of motion.       Right knee: She exhibits normal range of motion, no swelling and no effusion.       Left knee: She exhibits normal range of motion, no swelling and no effusion.  Neurological: She is alert and oriented to person, place, and time.  Skin: Skin is warm and dry.  No rash noted.  Psychiatric: She has a normal mood and affect. Her behavior is normal. Thought content normal.  Nursing note and vitals reviewed.   BP 126/70 (BP Location: Right Arm, Patient Position: Sitting, Cuff Size: Normal)   Pulse 98   Resp 16   Ht 5\' 7"  (1.702 m)   Wt 138 lb (62.6 kg)   SpO2 100%   BMI 21.61 kg/m   Assessment and Plan: 1. Heart palpitations PACs only, pt reassured Return or go  to ED if chest pressure, etc - EKG 12-Lead - SR @ 85, occasional PAC  2. Essential (primary) hypertension controlled  3. Benign esophageal stricture asx  4. Leg swelling Resolved, suspect ruptured Baker's cyst  No orders of the defined types were placed in this encounter.   Partially dictated using Editor, commissioning. Any errors are unintentional.  Halina Maidens, MD [facility]  05/01/2018

## 2018-05-29 ENCOUNTER — Ambulatory Visit (INDEPENDENT_AMBULATORY_CARE_PROVIDER_SITE_OTHER): Payer: Medicare Other

## 2018-05-29 VITALS — BP 148/80 | HR 88 | Temp 98.2°F | Resp 14 | Ht 67.0 in | Wt 135.0 lb

## 2018-05-29 DIAGNOSIS — E2839 Other primary ovarian failure: Secondary | ICD-10-CM | POA: Diagnosis not present

## 2018-05-29 DIAGNOSIS — Z0000 Encounter for general adult medical examination without abnormal findings: Secondary | ICD-10-CM | POA: Diagnosis not present

## 2018-05-29 NOTE — Progress Notes (Signed)
Subjective:   Kendra Lane is a 83 y.o. female who presents for Medicare Annual (Subsequent) preventive examination.  Review of Systems:  N/A Cardiac Risk Factors include: advanced age (>67men, >29 women);hypertension;sedentary lifestyle     Objective:     Vitals: BP (!) 148/80 (BP Location: Right Arm, Patient Position: Sitting, Cuff Size: Normal)   Pulse 88   Temp 98.2 F (36.8 C) (Oral)   Resp 14   Ht 5\' 7"  (1.702 m)   Wt 135 lb (61.2 kg)   SpO2 98%   BMI 21.14 kg/m   Body mass index is 21.14 kg/m.  Advanced Directives 05/29/2018 01/03/2018 07/13/2017 05/25/2017 02/13/2017 10/07/2016 07/06/2016  Does Patient Have a Medical Advance Directive? Yes No No Yes No Yes No  Type of Paramedic of [place];Living will - - Living will - Living will -  Copy of [place] in Chart? No - copy requested - - - - - -  Would patient like information on creating a medical advance directive? - No - Patient declined - - No - Patient declined - Yes - Scientist, clinical (histocompatibility and immunogenetics) given    Tobacco Social History   Tobacco Use  Smoking Status Never Smoker  Smokeless Tobacco Never Used  Tobacco Comment   smoking cessation materials not required     Counseling given: No Comment: smoking cessation materials not required  Clinical Intake:  Pre-visit preparation completed: Yes  Pain : No/denies pain   BMI - recorded: 21.14 Nutritional Status: BMI of 19-24  Normal Nutritional Risks: None Diabetes: No  How often do you need to have someone help you when you read instructions, pamphlets, or other written materials from your doctor or pharmacy?: 1 - Never  Interpreter Needed?: No  Information entered by :: AEversole, LPN  Past Medical History:  Diagnosis Date  . GERD (gastroesophageal reflux disease)   . Hypertension    Past Surgical History:  Procedure Laterality Date  . ABDOMINAL HYSTERECTOMY    . ESOPHAGOGASTRODUODENOSCOPY (EGD) WITH  ESOPHAGEAL DILATION  10/2014   Dr. Allen Norris  . TOTAL VAGINAL HYSTERECTOMY  1993   Family History  Problem Relation Age of Onset  . Breast cancer Daughter 42   Social History   Socioeconomic History  . Marital status: Widowed    Spouse name: Not on file  . Number of children: 8  . Years of education: Not on file  . Highest education level: 10th grade  Occupational History  . Occupation: Retired  Scientific laboratory technician  . Financial resource strain: Not hard at all  . Food insecurity:    Worry: Never true    Inability: Never true  . Transportation needs:    Medical: No    Non-medical: No  Tobacco Use  . Smoking status: Never Smoker  . Smokeless tobacco: Never Used  . Tobacco comment: smoking cessation materials not required  Substance and Sexual Activity  . Alcohol use: Yes    Alcohol/week: 2.0 standard drinks    Types: 2 Cans of beer per week  . Drug use: No  . Sexual activity: Not Currently  Lifestyle  . Physical activity:    Days per week: 0 days    Minutes per session: 0 min  . Stress: Not at all  Relationships  . Social connections:    Talks on phone: Patient refused    Gets together: Patient refused    Attends religious service: Patient refused    Active member of club or organization: Patient refused  Attends meetings of clubs or organizations: Patient refused    Relationship status: Widowed  Other Topics Concern  . Not on file  Social History Narrative  . Not on file    Outpatient Encounter Medications as of 05/29/2018  Medication Sig  . amLODipine (NORVASC) 10 MG tablet Take 1 tablet (10 mg total) by mouth daily.  Marland Kitchen aspirin 81 MG tablet Take 81 mg by mouth daily.  . metoprolol succinate (TOPROL-XL) 25 MG 24 hr tablet TAKE 1 TABLET(25 MG) BY MOUTH DAILY  . Multiple Vitamins-Minerals (CENTRUM SILVER 50+WOMEN PO) Take 1 tablet by mouth daily.  Marland Kitchen omeprazole (PRILOSEC) 20 MG capsule Take 1 capsule (20 mg total) by mouth daily.   No facility-administered encounter  medications on file as of 05/29/2018.     Activities of Daily Living In your present state of health, do you have any difficulty performing the following activities: 05/29/2018  Hearing? N  Comment denies hearing aids  Vision? N  Comment wears eyeglasses  Difficulty concentrating or making decisions? N  Walking or climbing stairs? Y  Comment fatigue  Dressing or bathing? N  Doing errands, shopping? N  Preparing Food and eating ? N  Comment denies dentures  Using the Toilet? N  In the past six months, have you accidently leaked urine? N  Do you have problems with loss of bowel control? N  Managing your Medications? N  Managing your Finances? N  Housekeeping or managing your Housekeeping? N  Some recent data might be hidden    Patient Care Team: Glean Hess, MD as PCP - General (Internal Medicine)    Assessment:   This is a routine wellness examination for Kendra Lane.  Exercise Activities and Dietary recommendations Current Exercise Habits: The patient does not participate in regular exercise at present, Exercise limited by: None identified  Goals    . DIET - INCREASE WATER INTAKE     Recommend to drink at least 6-8 8oz glasses of water per day.    . Prevent Falls     Remain active and continue to prevent falls.        Fall Risk Fall Risk  05/29/2018 05/25/2017 04/07/2016  Falls in the past year? No No No  Risk for fall due to : Impaired vision;History of fall(s) - -  Risk for fall due to: Comment wears eyeglasses - -   FALL RISK PREVENTION PERTAINING TO HOME: Is your home free of loose throw rugs in walkways, pet beds, electrical cords, etc? Yes Is there adequate lighting in your home to reduce risk of falls?  Yes Are there stairs in or around your home WITH handrails? Yes  ASSISTIVE DEVICES UTILIZED TO PREVENT FALLS: Use of a cane, walker or w/c? No Grab bars in the bathroom? Yes  Shower chair or a place to sit while bathing? Yes An elevated toilet seat or a  handicapped toilet? No  Timed Get Up and Go Performed: Yes. Pt ambulated 10 feet within 8 sec. Gait stead-fast and without the use of an assistive device. No intervention required at this time. Fall risk prevention has been discussed.  Community Resource Referral:  Pt declined my offer to send [employer] Referral to Care Guide for an elevated toilet seat.  Depression Screen PHQ 2/9 Scores 05/29/2018 05/25/2017 04/07/2016  PHQ - 2 Score 0 1 0  PHQ- 9 Score 0 - -     Cognitive Function MMSE - Mini Mental State Exam 04/07/2016  Orientation to time 5  Orientation to  time comments 6CIT  Orientation to Place 5  Orientation to Place-comments 6CIT  Registration 3  Registration-comments 6CIT  Attention/ Calculation 5  Attention/Calculation-comments 6CIT  Recall 1  Recall-comments 6CIT  Language- name 2 objects 2  Language- name 2 objects-comments 6CIT     6CIT Screen 05/29/2018 05/25/2017  What Year? 0 points 0 points  What month? 0 points 0 points  What time? 0 points 0 points  Count back from 20 0 points 0 points  Months in reverse 0 points 0 points  Repeat phrase 6 points 2 points  Total Score 6 2    Immunization History  Administered Date(s) Administered  . Pneumococcal Conjugate-13 04/07/2016  . Pneumococcal Polysaccharide-23 06/26/2012    Qualifies for Shingles Vaccine? Yes. Due for Shingrix. Education has been provided regarding the importance of this vaccine. Pt has been advised to call insurance company to determine out of pocket expense. Advised may also receive vaccine at local pharmacy or Health Dept. Verbalized acceptance and understanding.  Due for Flu vaccine. Declined my offer to administer today. Education has been provided regarding the importance of this vaccine but still declined. Advised may receive this vaccine at local pharmacy or Health Dept. Aware to provide a copy of the vaccination record if obtained from local pharmacy or Health Dept. Verbalized  acceptance and understanding.  Due for Tdap vaccine. Education has been provided regarding the importance of this vaccine. Advised may receive this vaccine at local pharmacy or Health Dept. Aware to provide a copy of the vaccination record if obtained from local pharmacy or Health Dept. Verbalized acceptance and understanding.  Screening Tests Health Maintenance  Topic Date Due  . DEXA SCAN  08/25/2002  . INFLUENZA VACCINE  07/14/2018 (Originally 04/13/2018)  . TETANUS/TDAP  12/01/2018 (Originally 08/25/1954)  . MAMMOGRAM  09/29/2018  . PNA vac Low Risk Adult  Completed    Cancer Screenings: Lung: Low Dose CT Chest recommended if Age 85-80 years, 30 pack-year currently smoking OR have quit w/in 15years. Patient does not qualify. Breast:  Up to date on Mammogram? Yes. Completed 09/29/17. Repeat every year   Up to date of Bone Density/Dexa? No. Ordered today. Pt aware that she will receive a call from our office regarding her appt Colorectal: No longer required  Additional Screenings: Hepatitis C Screening: Does not qualify    Plan:  I have personally reviewed and addressed the Medicare Annual Wellness questionnaire and have noted the following in the patient's chart:  A. Medical and social history B. Use of alcohol, tobacco or illicit drugs  C. Current medications and supplements D. Functional ability and status E.  Nutritional status F.  Physical activity G. Advance directives H. List of other physicians I.  Hospitalizations, surgeries, and ER visits in previous 12 months J.  [place] such as hearing and vision if needed, cognitive and depression L. Referrals and appointments  In addition, I have reviewed and discussed with patient certain preventive protocols, quality metrics, and best practice recommendations. A written personalized care plan for preventive services as well as general preventive health recommendations were provided to patient.  Signed,  Aleatha Borer, LPN Nurse Health Advisor  MD Recommendations: Due for Shingrix. Education has been provided regarding the importance of this vaccine. Pt has been advised to call insurance company to determine out of pocket expense. Advised may also receive vaccine at local pharmacy or Health Dept. Verbalized acceptance and understanding.  Due for Flu vaccine. Declined my offer to administer today. Education has  been provided regarding the importance of this vaccine but still declined. Advised may receive this vaccine at local pharmacy or Health Dept. Aware to provide a copy of the vaccination record if obtained from local pharmacy or Health Dept. Verbalized acceptance and understanding.  Due for Tdap vaccine. Education has been provided regarding the importance of this vaccine. Advised may receive this vaccine at local pharmacy or Health Dept. Aware to provide a copy of the vaccination record if obtained from local pharmacy or Health Dept. Verbalized acceptance and understanding.  Bone Density/Dexa: Ordered today. Pt aware that she will receive a call from our office regarding her appt

## 2018-05-29 NOTE — Patient Instructions (Signed)
Kendra Lane , Thank you for taking time to come for your Medicare Wellness Visit. I appreciate your ongoing commitment to your health goals. Please review the following plan we discussed and let me know if I can assist you in the future.   Screening recommendations/referrals: Colorectal Screening: No longer required Mammogram: Up to date Bone Density: You will receive a call from our office regarding your appointment  Vision and Dental Exams: Recommended annual ophthalmology exams for early detection of glaucoma and other disorders of the eye Recommended annual dental exams for proper oral hygiene  Vaccinations: Influenza vaccine: Declined Pneumococcal vaccine: Up to date Tdap vaccine: Declined. Please call your insurance company to determine your out of pocket expense. You may also receive this vaccine at your local pharmacy or Health Dept. Shingles vaccine: Please call your insurance company to determine your out of pocket expense for the Shingrix vaccine. You may receive this vaccine at your local pharmacy.  Advanced directives: Please bring a copy of your POA (Power of Attorney) and/or Living Will to your next appointment.  Goals: Recommend to drink at least 6-8 8oz glasses of water per day.  Next appointment: Please schedule your Annual Wellness Visit with your Nurse Health Advisor in one year.  Preventive Care 37 Years and Older, Female Preventive care refers to lifestyle choices and visits with your health care provider that can promote health and wellness. What does preventive care include?  A yearly physical exam. This is also called an annual well check.  Dental exams once or twice a year.  Routine eye exams. Ask your health care provider how often you should have your eyes checked.  Personal lifestyle choices, including:  Daily care of your teeth and gums.  Regular physical activity.  Eating a healthy diet.  Avoiding tobacco and drug use.  Limiting alcohol  use.  Practicing safe sex.  Taking low-dose aspirin every day.  Taking vitamin and mineral supplements as recommended by your health care provider. What happens during an annual well check? The services and screenings done by your health care provider during your annual well check will depend on your age, overall health, lifestyle risk factors, and family history of disease. Counseling  Your health care provider may ask you questions about your:  Alcohol use.  Tobacco use.  Drug use.  Emotional well-being.  Home and relationship well-being.  Sexual activity.  Eating habits.  History of falls.  Memory and ability to understand (cognition).  Work and work Statistician.  Reproductive health. Screening  You may have the following tests or measurements:  Height, weight, and BMI.  Blood pressure.  Lipid and cholesterol levels. These may be checked every 5 years, or more frequently if you are over 50 years old.  Skin check.  Lung cancer screening. You may have this screening every year starting at age 28 if you have a 30-pack-year history of smoking and currently smoke or have quit within the past 15 years.  Fecal occult blood test (FOBT) of the stool. You may have this test every year starting at age 68.  Flexible sigmoidoscopy or colonoscopy. You may have a sigmoidoscopy every 5 years or a colonoscopy every 10 years starting at age 96.  Hepatitis C blood test.  Hepatitis B blood test.  Sexually transmitted disease (STD) testing.  Diabetes screening. This is done by checking your blood sugar (glucose) after you have not eaten for a while (fasting). You may have this done every 1-3 years.  Bone density scan. This  is done to screen for osteoporosis. You may have this done starting at age 30.  Mammogram. This may be done every 1-2 years. Talk to your health care provider about how often you should have regular mammograms. Talk with your health care provider about  your test results, treatment options, and if necessary, the need for more tests. Vaccines  Your health care provider may recommend certain vaccines, such as:  Influenza vaccine. This is recommended every year.  Tetanus, diphtheria, and acellular pertussis (Tdap, Td) vaccine. You may need a Td booster every 10 years.  Zoster vaccine. You may need this after age 28.  Pneumococcal 13-valent conjugate (PCV13) vaccine. One dose is recommended after age 8.  Pneumococcal polysaccharide (PPSV23) vaccine. One dose is recommended after age 23. Talk to your health care provider about which screenings and vaccines you need and how often you need them. This information is not intended to replace advice given to you by your health care provider. Make sure you discuss any questions you have with your health care provider. Document Released: 09/26/2015 Document Revised: 05/19/2016 Document Reviewed: 07/01/2015 Elsevier Interactive Patient Education  2017 [employer] [place] Prevention in the Home Falls can cause injuries. They can happen to people of all ages. There are many things you can do to make your home safe and to help prevent falls. What can I do on the outside of my home?  Regularly fix the edges of walkways and driveways and fix any cracks.  Remove anything that might make you trip as you walk through a door, such as a raised step or threshold.  Trim any bushes or trees on the path to your home.  Use bright outdoor lighting.  Clear any walking paths of anything that might make someone trip, such as rocks or tools.  Regularly check to see if handrails are loose or broken. Make sure that both sides of any steps have handrails.  Any raised decks and porches should have guardrails on the edges.  Have any leaves, snow, or ice cleared regularly.  Use sand or salt on walking paths during winter.  Clean up any spills in your garage right away. This includes oil or grease spills. What  can I do in the bathroom?  Use night lights.  Install grab bars by the toilet and in the tub and shower. Do not use towel bars as grab bars.  Use non-skid mats or decals in the tub or shower.  If you need to sit down in the shower, use a plastic, non-slip stool.  Keep the floor dry. Clean up any water that spills on the floor as soon as it happens.  Remove soap buildup in the tub or shower regularly.  Attach bath mats securely with double-sided non-slip rug tape.  Do not have throw rugs and other things on the floor that can make you trip. What can I do in the bedroom?  Use night lights.  Make sure that you have a light by your bed that is easy to reach.  Do not use any sheets or blankets that are too big for your bed. They should not hang down onto the floor.  Have a firm chair that has side arms. You can use this for support while you get dressed.  Do not have throw rugs and other things on the floor that can make you trip. What can I do in the kitchen?  Clean up any spills right away.  Avoid walking on wet floors.  Keep items that you use a lot in easy-to-reach places.  If you need to reach something above you, use a strong step stool that has a grab bar.  Keep electrical cords out of the way.  Do not use floor polish or wax that makes floors slippery. If you must use wax, use non-skid floor wax.  Do not have throw rugs and other things on the floor that can make you trip. What can I do with my stairs?  Do not leave any items on the stairs.  Make sure that there are handrails on both sides of the stairs and use them. Fix handrails that are broken or loose. Make sure that handrails are as long as the stairways.  Check any carpeting to make sure that it is firmly attached to the stairs. Fix any carpet that is loose or worn.  Avoid having throw rugs at the top or bottom of the stairs. If you do have throw rugs, attach them to the floor with carpet tape.  Make sure  that you have a light switch at the top of the stairs and the bottom of the stairs. If you do not have them, ask someone to add them for you. What else can I do to help prevent falls?  Wear shoes that:  Do not have high heels.  Have rubber bottoms.  Are comfortable and fit you well.  Are closed at the toe. Do not wear sandals.  If you use a stepladder:  Make sure that it is fully opened. Do not climb a closed stepladder.  Make sure that both sides of the stepladder are locked into place.  Ask someone to hold it for you, if possible.  Clearly mark and make sure that you can see:  Any grab bars or handrails.  First and last steps.  Where the edge of each step is.  Use tools that help you move around (mobility aids) if they are needed. These include:  Canes.  Walkers.  Scooters.  Crutches.  Turn on the lights when you go into a dark area. Replace any light bulbs as soon as they burn out.  Set up your furniture so you have a clear path. Avoid moving your furniture around.  If any of your floors are uneven, fix them.  If there are any pets around you, be aware of where they are.  Review your medicines with your doctor. Some medicines can make you feel dizzy. This can increase your chance of falling. Ask your doctor what other things that you can do to help prevent falls. This information is not intended to replace advice given to you by your health care provider. Make sure you discuss any questions you have with your health care provider. Document Released: 06/26/2009 Document Revised: 02/05/2016 Document Reviewed: 10/04/2014 Elsevier Interactive Patient Education  2017 [employer].

## 2018-06-09 ENCOUNTER — Ambulatory Visit (INDEPENDENT_AMBULATORY_CARE_PROVIDER_SITE_OTHER): Payer: Medicare Other | Admitting: Internal Medicine

## 2018-06-09 ENCOUNTER — Encounter: Payer: Self-pay | Admitting: Internal Medicine

## 2018-06-09 VITALS — BP 138/80 | HR 86 | Resp 16 | Ht 67.0 in | Wt 135.0 lb

## 2018-06-09 DIAGNOSIS — I10 Essential (primary) hypertension: Secondary | ICD-10-CM

## 2018-06-09 DIAGNOSIS — Z0000 Encounter for general adult medical examination without abnormal findings: Secondary | ICD-10-CM | POA: Diagnosis not present

## 2018-06-09 DIAGNOSIS — G4720 Circadian rhythm sleep disorder, unspecified type: Secondary | ICD-10-CM | POA: Diagnosis not present

## 2018-06-09 DIAGNOSIS — Z1231 Encounter for screening mammogram for malignant neoplasm of breast: Secondary | ICD-10-CM | POA: Diagnosis not present

## 2018-06-09 DIAGNOSIS — K222 Esophageal obstruction: Secondary | ICD-10-CM

## 2018-06-09 LAB — POCT URINALYSIS DIPSTICK
BILIRUBIN UA: NEGATIVE
Glucose, UA: NEGATIVE
Ketones, UA: NEGATIVE
Leukocytes, UA: NEGATIVE
Nitrite, UA: NEGATIVE
ODOR: NEGATIVE
PH UA: 7 (ref 5.0–8.0)
Protein, UA: NEGATIVE
Spec Grav, UA: <=1.005 — AB (ref 1.010–1.025)
UROBILINOGEN UA: 0.2 U/dL

## 2018-06-09 MED ORDER — OMEPRAZOLE 20 MG PO CPDR: 20.0000 mg | DELAYED_RELEASE_CAPSULE | Freq: Every day | ORAL | 3 refills | Status: DC

## 2018-06-09 NOTE — Patient Instructions (Signed)
May take Melatonin 5-10 mg at bedtime as needed to aid with sleep.

## 2018-06-09 NOTE — Progress Notes (Signed)
Date:  06/09/2018   Name:  Kendra Lane   DOB:  Jan 28, 1935   MRN:  [id number]   Chief Complaint: Annual Exam (Just had MAW. Breast Exam.) Kendra Lane is a 83 y.o. female who presents today for her Complete Annual Exam. She feels well. She reports exercising walking. She reports she is sleeping poorly.   Hypertension  This is a chronic problem. The problem is controlled. Pertinent negatives include no chest pain, headaches, palpitations or shortness of breath. Past treatments include beta blockers and calcium channel blockers.  Insomnia  Primary symptoms: sleep disturbance, premature morning awakening.  The problem occurs every several days.    Review of Systems  Constitutional: Negative for chills, fatigue and fever.  HENT: Negative for congestion, hearing loss, tinnitus, trouble swallowing and voice change.   Eyes: Negative for visual disturbance.  Respiratory: Negative for cough, chest tightness, shortness of breath and wheezing.   Cardiovascular: Negative for chest pain, palpitations and leg swelling.  Gastrointestinal: Negative for abdominal pain, constipation, diarrhea and vomiting.  Endocrine: Negative for polydipsia and polyuria.  Genitourinary: Negative for dysuria, frequency, genital sores, vaginal bleeding and vaginal discharge.  Musculoskeletal: Negative for arthralgias, gait problem and joint swelling.  Skin: Negative for color change and rash.  Neurological: Negative for dizziness, tremors, light-headedness and headaches.  Hematological: Negative for adenopathy. Does not bruise/bleed easily.  Psychiatric/Behavioral: Positive for sleep disturbance. Negative for dysphoric mood. The patient has insomnia. The patient is not nervous/anxious.     Patient Active Problem List   Diagnosis Date Noted  . Arthritis of shoulder 11/30/2017  . Bigeminy 04/07/2016  . Benign esophageal stricture 01/21/2015  . Essential (primary) hypertension 01/21/2015  . Hammer toe  01/21/2015  . Fatty tumor 01/21/2015    No Known Allergies  Past Surgical History:  Procedure Laterality Date  . ABDOMINAL HYSTERECTOMY    . ESOPHAGOGASTRODUODENOSCOPY (EGD) WITH ESOPHAGEAL DILATION  10/2014   Dr. Allen Norris  . TOTAL VAGINAL HYSTERECTOMY  1993    Social History   Tobacco Use  . Smoking status: Never Smoker  . Smokeless tobacco: Never Used  . Tobacco comment: smoking cessation materials not required  Substance Use Topics  . Alcohol use: Yes    Alcohol/week: 2.0 standard drinks    Types: 2 Cans of beer per week  . Drug use: No     Medication list has been reviewed and updated.  Current Meds  Medication Sig  . amLODipine (NORVASC) 10 MG tablet Take 1 tablet (10 mg total) by mouth daily.  Marland Kitchen aspirin 81 MG tablet Take 81 mg by mouth daily.  . metoprolol succinate (TOPROL-XL) 25 MG 24 hr tablet TAKE 1 TABLET(25 MG) BY MOUTH DAILY  . Multiple Vitamins-Minerals (CENTRUM SILVER 50+WOMEN PO) Take 1 tablet by mouth daily.  Marland Kitchen omeprazole (PRILOSEC) 20 MG capsule Take 1 capsule (20 mg total) by mouth daily.  . [DISCONTINUED] omeprazole (PRILOSEC) 20 MG capsule Take 1 capsule (20 mg total) by mouth daily.    PHQ 2/9 Scores 05/29/2018 05/25/2017 04/07/2016  PHQ - 2 Score 0 1 0  PHQ- 9 Score 0 - -    Physical Exam  Constitutional: She is oriented to person, place, and time. She appears well-developed and well-nourished. No distress.  HENT:  Head: Normocephalic and atraumatic.  Right Ear: Tympanic membrane and ear canal normal.  Left Ear: Tympanic membrane and ear canal normal.  Nose: Right sinus exhibits no maxillary sinus tenderness. Left sinus exhibits no maxillary sinus tenderness.  Mouth/Throat:  Uvula is midline and oropharynx is clear and moist.  Eyes: Conjunctivae and EOM are normal. Right eye exhibits no discharge. Left eye exhibits no discharge. No scleral icterus.  Neck: Normal range of motion. Carotid bruit is not present. No erythema present. No thyromegaly  present.  Cardiovascular: Normal rate, regular rhythm, normal heart sounds and normal pulses.  Pulmonary/Chest: Effort normal. No respiratory distress. She has no wheezes. Right breast exhibits no mass, no nipple discharge, no skin change and no tenderness. Left breast exhibits no mass, no nipple discharge, no skin change and no tenderness.  Abdominal: Soft. Bowel sounds are normal. There is no hepatosplenomegaly. There is no tenderness. There is no CVA tenderness.  Musculoskeletal: Normal range of motion.  Lymphadenopathy:    She has no cervical adenopathy.    She has no axillary adenopathy.  Neurological: She is alert and oriented to person, place, and time. She has normal reflexes. No cranial nerve deficit or sensory deficit.  Skin: Skin is warm, dry and intact. No rash noted.  Psychiatric: She has a normal mood and affect. Her speech is normal and behavior is normal. Thought content normal.  Nursing note and vitals reviewed.   BP (!) 146/78 (BP Location: Right Arm, Patient Position: Sitting, Cuff Size: Normal)   Pulse 86   Resp 16   Ht 5\' 7"  (1.702 m)   Wt 135 lb (61.2 kg)   SpO2 100%   BMI 21.14 kg/m   Assessment and Plan: 1. Annual physical exam Normal exam Continue healthy diet, exercise  2. Essential (primary) hypertension controlled  3. Benign esophageal stricture On daily PPI with no sx - omeprazole (PRILOSEC) 20 MG capsule; Take 1 capsule (20 mg total) by mouth daily.  Dispense: 90 capsule; Refill: 3  4. Encounter for screening mammogram for breast cancer - MM 3D SCREEN BREAST BILATERAL; Future  5. Sleep pattern disturbance Recommend trial of melatonin  Partially dictated using Editor, commissioning. Any errors are unintentional.  Halina Maidens, MD [facility]  06/09/2018

## 2018-06-10 LAB — COMPREHENSIVE METABOLIC PANEL
A/G RATIO: 1.8 (ref 1.2–2.2)
ALT: 13 IU/L (ref 0–32)
AST: 18 IU/L (ref 0–40)
Albumin: 4.8 g/dL — ABNORMAL HIGH (ref 3.5–4.7)
Alkaline Phosphatase: 53 IU/L (ref 39–117)
BUN/Creatinine Ratio: 22 (ref 12–28)
BUN: 14 mg/dL (ref 8–27)
Bilirubin Total: 0.3 mg/dL (ref 0.0–1.2)
CALCIUM: 9.5 mg/dL (ref 8.7–10.3)
CO2: 21 mmol/L (ref 20–29)
Chloride: 102 mmol/L (ref 96–106)
Creatinine, Ser: 0.63 mg/dL (ref 0.57–1.00)
GFR calc Af Amer: 98 mL/min/{1.73_m2} (ref >59)
GFR, EST NON AFRICAN AMERICAN: 85 mL/min/{1.73_m2} (ref >59)
GLUCOSE: 100 mg/dL — AB (ref 65–99)
Globulin, Total: 2.7 g/dL (ref 1.5–4.5)
POTASSIUM: 4.1 mmol/L (ref 3.5–5.2)
Sodium: 143 mmol/L (ref 134–144)
TOTAL PROTEIN: 7.5 g/dL (ref 6.0–8.5)

## 2018-06-10 LAB — CBC WITH DIFFERENTIAL/PLATELET
Basophils Absolute: 0 10*3/uL (ref 0.0–0.2)
Basos: 1 %
EOS (ABSOLUTE): 0.1 10*3/uL (ref 0.0–0.4)
Eos: 1 %
Hematocrit: 41.4 % (ref 34.0–46.6)
Hemoglobin: 13.7 g/dL (ref 11.1–15.9)
IMMATURE GRANS (ABS): 0 10*3/uL (ref 0.0–0.1)
IMMATURE GRANULOCYTES: 0 %
LYMPHS: 33 %
Lymphocytes Absolute: 1.8 10*3/uL (ref 0.7–3.1)
MCH: 30.8 pg (ref 26.6–33.0)
MCHC: 33.1 g/dL (ref 31.5–35.7)
MCV: 93 fL (ref 79–97)
MONOS ABS: 0.5 10*3/uL (ref 0.1–0.9)
Monocytes: 10 %
NEUTROS PCT: 55 %
Neutrophils Absolute: 3 10*3/uL (ref 1.4–7.0)
Platelets: 347 10*3/uL (ref 150–450)
RBC: 4.45 x10E6/uL (ref 3.77–5.28)
RDW: 13.2 % (ref 12.3–15.4)
WBC: 5.5 10*3/uL (ref 3.4–10.8)

## 2018-06-10 LAB — LIPID PANEL
CHOL/HDL RATIO: 2.1 ratio (ref 0.0–4.4)
Cholesterol, Total: 222 mg/dL — ABNORMAL HIGH (ref 100–199)
HDL: 107 mg/dL (ref >39)
LDL Calculated: 97 mg/dL (ref 0–99)
TRIGLYCERIDES: 92 mg/dL (ref 0–149)
VLDL CHOLESTEROL CAL: 18 mg/dL (ref 5–40)

## 2018-06-10 LAB — TSH: TSH: 1.58 u[IU]/mL (ref 0.450–4.500)

## 2018-06-15 ENCOUNTER — Encounter: Payer: Self-pay | Admitting: Internal Medicine

## 2018-06-15 ENCOUNTER — Ambulatory Visit
Admission: RE | Admit: 2018-06-15 | Discharge: 2018-06-15 | Disposition: A | Discharge status: Home / Self Care | Payer: Medicare Other | Source: Ambulatory Visit | Attending: Internal Medicine | Admitting: Internal Medicine

## 2018-06-15 DIAGNOSIS — Z780 Asymptomatic menopausal state: Secondary | ICD-10-CM | POA: Diagnosis not present

## 2018-06-15 DIAGNOSIS — E2839 Other primary ovarian failure: Secondary | ICD-10-CM | POA: Diagnosis not present

## 2018-06-15 DIAGNOSIS — M810 Age-related osteoporosis without current pathological fracture: Secondary | ICD-10-CM | POA: Insufficient documentation

## 2018-07-08 ENCOUNTER — Other Ambulatory Visit: Payer: Self-pay | Admitting: Internal Medicine

## 2018-07-08 DIAGNOSIS — I10 Essential (primary) hypertension: Secondary | ICD-10-CM

## 2018-08-03 ENCOUNTER — Other Ambulatory Visit: Payer: Self-pay | Admitting: Internal Medicine

## 2018-08-03 DIAGNOSIS — I10 Essential (primary) hypertension: Secondary | ICD-10-CM

## 2018-09-15 ENCOUNTER — Ambulatory Visit (INDEPENDENT_AMBULATORY_CARE_PROVIDER_SITE_OTHER): Payer: Medicare Other | Admitting: Internal Medicine

## 2018-09-15 ENCOUNTER — Encounter: Payer: Self-pay | Admitting: Internal Medicine

## 2018-09-15 VITALS — BP 140/82 | HR 98 | Temp 98.7°F | Resp 16 | Ht 67.0 in | Wt 137.0 lb

## 2018-09-15 DIAGNOSIS — J069 Acute upper respiratory infection, unspecified: Secondary | ICD-10-CM | POA: Diagnosis not present

## 2018-09-15 NOTE — Progress Notes (Signed)
Date:  09/15/2018   Name:  Kendra Lane   DOB:  1935-03-19   MRN:  [id number]   Chief Complaint: Cough (2 weeks of cough denies any fever )  Cough  This is a new problem. The current episode started 1 to 4 weeks ago. The problem has been gradually improving. The problem occurs every few hours. The cough is non-productive. Pertinent negatives include no chest pain, chills, fever, headaches, sore throat, shortness of breath or wheezing. The symptoms are aggravated by cold air. Treatments tried: lozenges. There is no history of environmental allergies.    Review of Systems  Constitutional: Negative for chills, fatigue and fever.  HENT: Negative for congestion, sinus pressure and sore throat.   Respiratory: Positive for cough. Negative for choking, chest tightness, shortness of breath and wheezing.   Cardiovascular: Negative for chest pain, palpitations and leg swelling.  Allergic/Immunologic: Negative for environmental allergies.  Neurological: Negative for dizziness, light-headedness and headaches.    Patient Active Problem List   Diagnosis Date Noted  . Osteoporosis 06/15/2018  . Arthritis of shoulder 11/30/2017  . Bigeminy 04/07/2016  . Benign esophageal stricture 01/21/2015  . Essential (primary) hypertension 01/21/2015  . Hammer toe 01/21/2015  . Fatty tumor 01/21/2015    No Known Allergies  Past Surgical History:  Procedure Laterality Date  . ABDOMINAL HYSTERECTOMY    . ESOPHAGOGASTRODUODENOSCOPY (EGD) WITH ESOPHAGEAL DILATION  10/2014   Dr. Allen Norris  . TOTAL VAGINAL HYSTERECTOMY  1993    Social History   Tobacco Use  . Smoking status: Never Smoker  . Smokeless tobacco: Never Used  . Tobacco comment: smoking cessation materials not required  Substance Use Topics  . Alcohol use: Yes    Alcohol/week: 2.0 standard drinks    Types: 2 Cans of beer per week  . Drug use: No     Medication list has been reviewed and updated.  Current Meds  Medication Sig  .  amLODipine (NORVASC) 10 MG tablet TAKE 1 TABLET(10 MG) BY MOUTH DAILY  . aspirin 81 MG tablet Take 81 mg by mouth daily.  . metoprolol succinate (TOPROL-XL) 25 MG 24 hr tablet TAKE 1 TABLET(25 MG) BY MOUTH DAILY  . Multiple Vitamins-Minerals (CENTRUM SILVER 50+WOMEN PO) Take 1 tablet by mouth daily.  Marland Kitchen omeprazole (PRILOSEC) 20 MG capsule Take 1 capsule (20 mg total) by mouth daily.    PHQ 2/9 Scores 09/15/2018 05/29/2018 05/25/2017 04/07/2016  PHQ - 2 Score 0 0 1 0  PHQ- 9 Score 0 0 - -    Physical Exam Vitals signs and nursing note reviewed.  Constitutional:      General: She is not in acute distress.    Appearance: She is well-developed.  HENT:     Head: Normocephalic and atraumatic.     Right Ear: Tympanic membrane and ear canal normal.     Left Ear: Tympanic membrane and ear canal normal.     Nose: Nose normal.     Mouth/Throat:     Mouth: Mucous membranes are moist.     Pharynx: No oropharyngeal exudate or posterior oropharyngeal erythema.  Neck:     Musculoskeletal: Normal range of motion and neck supple.  Cardiovascular:     Rate and Rhythm: Normal rate and regular rhythm.  Pulmonary:     Effort: Pulmonary effort is normal. No respiratory distress.     Breath sounds: Normal breath sounds. No wheezing or rales.  Lymphadenopathy:     Cervical: No cervical adenopathy.  Skin:  General: Skin is warm and dry.     Findings: No rash.  Neurological:     Mental Status: She is alert and oriented to person, place, and time.  Psychiatric:        Behavior: Behavior normal.        Thought Content: Thought content normal.     BP 140/82   Pulse 98   Temp 98.7 F (37.1 C) (Oral)   Resp 16   Ht 5\' 7"  (1.702 m)   Wt 137 lb (62.1 kg)   SpO2 100%   BMI 21.46 kg/m   Assessment and Plan: 1. Viral URI Resolving - pt reassured, can use lozenges and tylenol PRN   Partially dictated using Editor, commissioning. Any errors are unintentional.  Halina Maidens, MD [facility]  09/15/2018

## 2018-10-05 ENCOUNTER — Other Ambulatory Visit: Payer: Self-pay | Admitting: Internal Medicine

## 2018-10-05 DIAGNOSIS — I10 Essential (primary) hypertension: Secondary | ICD-10-CM

## 2018-11-08 ENCOUNTER — Ambulatory Visit
Admission: RE | Admit: 2018-11-08 | Discharge: 2018-11-08 | Disposition: A | Discharge status: Home / Self Care | Payer: Medicare Other | Source: Ambulatory Visit | Attending: Internal Medicine | Admitting: Internal Medicine

## 2018-11-08 DIAGNOSIS — Z1231 Encounter for screening mammogram for malignant neoplasm of breast: Secondary | ICD-10-CM | POA: Diagnosis not present

## 2018-12-01 ENCOUNTER — Other Ambulatory Visit: Payer: Self-pay

## 2018-12-01 DIAGNOSIS — I10 Essential (primary) hypertension: Secondary | ICD-10-CM

## 2018-12-01 MED ORDER — METOPROLOL SUCCINATE ER 25 MG PO TB24: ORAL_TABLET | ORAL | 0 refills | Status: DC

## 2018-12-08 ENCOUNTER — Ambulatory Visit: Payer: Medicare Other | Admitting: Internal Medicine

## 2019-01-29 ENCOUNTER — Other Ambulatory Visit: Payer: Self-pay | Admitting: Internal Medicine

## 2019-01-29 DIAGNOSIS — I10 Essential (primary) hypertension: Secondary | ICD-10-CM

## 2019-02-13 ENCOUNTER — Encounter: Payer: Self-pay | Admitting: Internal Medicine

## 2019-02-13 ENCOUNTER — Ambulatory Visit (INDEPENDENT_AMBULATORY_CARE_PROVIDER_SITE_OTHER): Payer: Medicare Other | Admitting: Internal Medicine

## 2019-02-13 ENCOUNTER — Other Ambulatory Visit: Payer: Self-pay

## 2019-02-13 VITALS — BP 112/64 | HR 111 | Ht 67.0 in | Wt 136.4 lb

## 2019-02-13 DIAGNOSIS — I10 Essential (primary) hypertension: Secondary | ICD-10-CM

## 2019-02-13 DIAGNOSIS — K222 Esophageal obstruction: Secondary | ICD-10-CM | POA: Diagnosis not present

## 2019-02-13 DIAGNOSIS — R000 Tachycardia, unspecified: Secondary | ICD-10-CM | POA: Diagnosis not present

## 2019-02-13 NOTE — Progress Notes (Signed)
Date:  02/13/2019   Name:  Kendra Lane   DOB:  July 07, 1935   MRN:  [id number]   Chief Complaint: Hypertension and Gastroesophageal Reflux  Hypertension  This is a chronic problem. The problem is controlled. Associated symptoms include palpitations. Pertinent negatives include no chest pain, headaches or shortness of breath. Past treatments include beta blockers and calcium channel blockers. There are no compliance problems.   Gastroesophageal Reflux  She reports no abdominal pain, no chest pain, no coughing, no dysphagia, no heartburn, no sore throat or no wheezing. This is a chronic problem. Pertinent negatives include no fatigue. She has tried a PPI for the symptoms. Past procedures include an EGD (mild esophageal stricture).  She admits that she sometimes feels that her heart is beating faster when she gets anxious.  She has been more anxious with the Covid-19 issues.  No chest pain, dizziness, syncope or SOB.  Review of Systems  Constitutional: Negative for chills, fatigue, fever and unexpected weight change.  HENT: Negative for sore throat.   Eyes: Negative for visual disturbance.  Respiratory: Negative for cough, chest tightness, shortness of breath and wheezing.   Cardiovascular: Positive for palpitations. Negative for chest pain and leg swelling.  Gastrointestinal: Negative for abdominal pain, constipation, diarrhea, dysphagia and heartburn.  Allergic/Immunologic: Negative for environmental allergies.  Neurological: Negative for dizziness, light-headedness and headaches.  Hematological: Negative for adenopathy.  Psychiatric/Behavioral: Negative for dysphoric mood and sleep disturbance. The patient is nervous/anxious.     Patient Active Problem List   Diagnosis Date Noted  . Osteoporosis 06/15/2018  . Arthritis of shoulder 11/30/2017  . Bigeminy 04/07/2016  . Benign esophageal stricture 01/21/2015  . Essential (primary) hypertension 01/21/2015  . Hammer toe 01/21/2015   . Fatty tumor 01/21/2015    No Known Allergies  Past Surgical History:  Procedure Laterality Date  . ABDOMINAL HYSTERECTOMY    . ESOPHAGOGASTRODUODENOSCOPY (EGD) WITH ESOPHAGEAL DILATION  10/2014   Dr. Allen Norris  . TOTAL VAGINAL HYSTERECTOMY  1993    Social History   Tobacco Use  . Smoking status: Never Smoker  . Smokeless tobacco: Never Used  . Tobacco comment: smoking cessation materials not required  Substance Use Topics  . Alcohol use: Yes    Alcohol/week: 2.0 standard drinks    Types: 2 Cans of beer per week  . Drug use: No     Medication list has been reviewed and updated.  Current Meds  Medication Sig  . amLODipine (NORVASC) 10 MG tablet TAKE 1 TABLET BY MOUTH DAILY.  Marland Kitchen aspirin 81 MG tablet Take 81 mg by mouth daily.  . calcium-vitamin D (OSCAL WITH D) 500-200 MG-UNIT tablet Take 1 tablet by mouth. 600 mg daily  . metoprolol succinate (TOPROL-XL) 25 MG 24 hr tablet TAKE 1 TABLET(25 MG) BY MOUTH DAILY  . Multiple Vitamins-Minerals (CENTRUM SILVER 50+WOMEN PO) Take 1 tablet by mouth daily.  Marland Kitchen omeprazole (PRILOSEC) 20 MG capsule Take 1 capsule (20 mg total) by mouth daily.    PHQ 2/9 Scores 02/13/2019 09/15/2018 05/29/2018 05/25/2017  PHQ - 2 Score 0 0 0 1  PHQ- 9 Score - 0 0 -    BP Readings from Last 3 Encounters:  02/13/19 112/64  09/15/18 140/82  06/09/18 138/80    Physical Exam Vitals signs and nursing note reviewed.  Constitutional:      General: She is not in acute distress.    Appearance: She is well-developed.  HENT:     Head: Normocephalic and atraumatic.  Mouth/Throat:     Mouth: Mucous membranes are moist.  Eyes:     Pupils: Pupils are equal, round, and reactive to light.  Neck:     Musculoskeletal: Normal range of motion and neck supple.  Cardiovascular:     Rate and Rhythm: Regular rhythm. Tachycardia present.     Pulses: Normal pulses.     Heart sounds: No murmur.  Pulmonary:     Effort: Pulmonary effort is normal. No respiratory  distress.     Breath sounds: No wheezing or rhonchi.  Musculoskeletal: Normal range of motion.     Right lower leg: No edema.     Left lower leg: No edema.  Lymphadenopathy:     Cervical: No cervical adenopathy.  Skin:    General: Skin is warm and dry.     Capillary Refill: Capillary refill takes less than 2 seconds.     Findings: No rash.  Neurological:     Mental Status: She is alert and oriented to person, place, and time.  Psychiatric:        Behavior: Behavior normal.        Thought Content: Thought content normal.     Wt Readings from Last 3 Encounters:  02/13/19 136 lb 6.4 oz (61.9 kg)  09/15/18 137 lb (62.1 kg)  06/09/18 135 lb (61.2 kg)    BP 112/64   Pulse (!) 111   Ht 5\' 7"  (1.702 m)   Wt 136 lb 6.4 oz (61.9 kg)   SpO2 99%   BMI 21.36 kg/m   Assessment and Plan: 1. Essential (primary) hypertension controlled  2. Benign esophageal stricture Doing well on omeprazole  3. Tachycardia May take an extra metoprolol daily as needed - EKG 12-Lead - SR @ 97; WNL   Partially dictated using Editor, commissioning. Any errors are unintentional.  Halina Maidens, MD [facility]  02/13/2019

## 2019-02-13 NOTE — Patient Instructions (Signed)
Make take an extra metoprolol once a day as needed for fast heart beat.

## 2019-03-26 ENCOUNTER — Telehealth: Payer: Self-pay

## 2019-03-26 NOTE — Telephone Encounter (Signed)
Patient called saying she was seen a few weeks ago for tachycardia. She is still having episodes where she feels her heart fluttering. She was told to take metoprolol twice daily when she feels the palpitations.  She said it helps but she is still having episodes of her heart beating fast. Spoke with Dr. Army Melia and told patient to take metoprolol BID every day and follow up in 1 month. Made 1 month follow and pt verbalized understanding of this.

## 2019-04-27 ENCOUNTER — Ambulatory Visit: Payer: Medicaid Other | Admitting: Internal Medicine

## 2019-05-04 ENCOUNTER — Other Ambulatory Visit: Payer: Self-pay

## 2019-05-04 ENCOUNTER — Ambulatory Visit (INDEPENDENT_AMBULATORY_CARE_PROVIDER_SITE_OTHER): Payer: Medicare Other | Admitting: Internal Medicine

## 2019-05-04 ENCOUNTER — Encounter: Payer: Self-pay | Admitting: Internal Medicine

## 2019-05-04 VITALS — BP 132/76 | HR 96 | Resp 16 | Ht 67.0 in | Wt 132.0 lb

## 2019-05-04 DIAGNOSIS — I499 Cardiac arrhythmia, unspecified: Secondary | ICD-10-CM

## 2019-05-04 DIAGNOSIS — I498 Other specified cardiac arrhythmias: Secondary | ICD-10-CM

## 2019-05-04 DIAGNOSIS — I10 Essential (primary) hypertension: Secondary | ICD-10-CM

## 2019-05-04 MED ORDER — METOPROLOL SUCCINATE ER 25 MG PO TB24: 25.0000 mg | ORAL_TABLET | Freq: Two times a day (BID) | ORAL | 3 refills | Status: DC

## 2019-05-04 NOTE — Progress Notes (Signed)
Date:  05/04/2019   Name:  Kendra Lane   DOB:  [birth year]   MRN:  [id number]   Chief Complaint: Tachycardia (Taking 50 mg of metoprolol daily now. ) Pt declines flu vaccine today. Hypertension This is a chronic problem. The problem is controlled. Associated symptoms include palpitations. Pertinent negatives include no chest pain, peripheral edema or shortness of breath. Past treatments include beta blockers and calcium channel blockers. The current treatment provides moderate improvement. There are no compliance problems.   Palpitations  This is a recurrent problem. The problem has been gradually improving. The symptoms are aggravated by stress. Associated symptoms include anxiety. Pertinent negatives include no chest pain, diaphoresis, dizziness, nausea, near-syncope, shortness of breath, vomiting or weakness. She has tried beta blockers for the symptoms. The treatment provided moderate (increased metoprolol last visit) relief.   Lab Results  Component Value Date   CREATININE 0.63 06/09/2018   BUN 14 06/09/2018   NA 143 06/09/2018   K 4.1 06/09/2018   CL 102 06/09/2018   CO2 21 06/09/2018   Lab Results  Component Value Date   WBC 5.5 06/09/2018   HGB 13.7 06/09/2018   HCT 41.4 06/09/2018   MCV 93 06/09/2018   PLT 347 06/09/2018    Review of Systems  Constitutional: Negative for appetite change, diaphoresis and unexpected weight change.  Respiratory: Negative for choking, chest tightness, shortness of breath and wheezing.   Cardiovascular: Positive for palpitations. Negative for chest pain, leg swelling and near-syncope.  Gastrointestinal: Negative for abdominal pain, nausea and vomiting.  Neurological: Negative for dizziness, tremors, syncope, weakness and light-headedness.  Psychiatric/Behavioral: Negative for dysphoric mood. The patient is nervous/anxious.     Patient Active Problem List   Diagnosis Date Noted  . Osteoporosis 06/15/2018  . Arthritis of shoulder  11/30/2017  . Bigeminy 04/07/2016  . Benign esophageal stricture 01/21/2015  . Essential (primary) hypertension 01/21/2015  . Hammer toe 01/21/2015  . Fatty tumor 01/21/2015    No Known Allergies  Past Surgical History:  Procedure Laterality Date  . ABDOMINAL HYSTERECTOMY    . ESOPHAGOGASTRODUODENOSCOPY (EGD) WITH ESOPHAGEAL DILATION  10/2014   Dr. Allen Norris  . TOTAL VAGINAL HYSTERECTOMY  1993    Social History   Tobacco Use  . Smoking status: Never Smoker  . Smokeless tobacco: Never Used  . Tobacco comment: smoking cessation materials not required  Substance Use Topics  . Alcohol use: Yes    Alcohol/week: 2.0 standard drinks    Types: 2 Cans of beer per week  . Drug use: No     Medication list has been reviewed and updated.  Current Meds  Medication Sig  . amLODipine (NORVASC) 10 MG tablet TAKE 1 TABLET BY MOUTH DAILY.  Marland Kitchen aspirin 81 MG tablet Take 81 mg by mouth daily.  . calcium-vitamin D (OSCAL WITH D) 500-200 MG-UNIT tablet Take 1 tablet by mouth. 600 mg daily  . metoprolol succinate (TOPROL-XL) 25 MG 24 hr tablet Take 1 tablet (25 mg total) by mouth 2 (two) times daily.  . Multiple Vitamins-Minerals (CENTRUM SILVER 50+WOMEN PO) Take 1 tablet by mouth daily.  Marland Kitchen omeprazole (PRILOSEC) 20 MG capsule Take 1 capsule (20 mg total) by mouth daily.  . [DISCONTINUED] metoprolol succinate (TOPROL-XL) 25 MG 24 hr tablet TAKE 1 TABLET(25 MG) BY MOUTH DAILY (Patient taking differently: Take 50 mg by mouth daily. Take 50 mg daily by mouth.)    PHQ 2/9 Scores 05/04/2019 02/13/2019 09/15/2018 05/29/2018  PHQ - 2 Score 0 0  0 0  PHQ- 9 Score - - 0 0    BP Readings from Last 3 Encounters:  05/04/19 132/76  02/13/19 112/64  09/15/18 140/82    Physical Exam Vitals signs and nursing note reviewed.  Constitutional:      General: She is not in acute distress.    Appearance: Normal appearance. She is well-developed.  HENT:     Head: Normocephalic and atraumatic.  Neck:      Musculoskeletal: Normal range of motion.  Cardiovascular:     Rate and Rhythm: Tachycardia present.  No extrasystoles are present.    Pulses: Normal pulses.     Heart sounds: No murmur.  Pulmonary:     Effort: Pulmonary effort is normal. No respiratory distress.     Breath sounds: No wheezing or rhonchi.  Musculoskeletal: Normal range of motion.     Right lower leg: No edema.     Left lower leg: No edema.  Lymphadenopathy:     Cervical: No cervical adenopathy.  Skin:    General: Skin is warm and dry.     Capillary Refill: Capillary refill takes less than 2 seconds.     Findings: No rash.  Neurological:     General: No focal deficit present.     Mental Status: She is alert and oriented to person, place, and time.  Psychiatric:        Behavior: Behavior normal.        Thought Content: Thought content normal.     Wt Readings from Last 3 Encounters:  05/04/19 132 lb (59.9 kg)  02/13/19 136 lb 6.4 oz (61.9 kg)  09/15/18 137 lb (62.1 kg)    BP 132/76   Pulse 96   Resp 16   Ht 5\' 7"  (1.702 m)   Wt 132 lb (59.9 kg)   SpO2 99%   BMI 20.67 kg/m   Assessment and Plan: 1. Essential (primary) hypertension Clinically stable exam with well controlled BP.   Tolerating medications, toprol 25 mg bid, without side effects at this time. Pt to continue current regimen and low sodium diet Will check labs Pt will follow up in 4 months, sooner if needed - metoprolol succinate (TOPROL-XL) 25 MG 24 hr tablet; Take 1 tablet (25 mg total) by mouth 2 (two) times daily.  Dispense: 180 tablet; Refill: 3 - Comprehensive metabolic panel - CBC with Differential/Platelet - TSH  2. Bigeminy On EKG last visit HR slightly improved, pt remains asx - Magnesium   Partially dictated using Editor, commissioning. Any errors are unintentional.  Halina Maidens, MD [facility]  05/04/2019

## 2019-05-05 LAB — COMPREHENSIVE METABOLIC PANEL
ALT: 12 IU/L (ref 0–32)
AST: 16 IU/L (ref 0–40)
Albumin/Globulin Ratio: 1.7 (ref 1.2–2.2)
Albumin: 4.8 g/dL — ABNORMAL HIGH (ref 3.6–4.6)
Alkaline Phosphatase: 43 IU/L (ref 39–117)
BUN/Creatinine Ratio: 21 (ref 12–28)
BUN: 14 mg/dL (ref 8–27)
Bilirubin Total: 0.3 mg/dL (ref 0.0–1.2)
CO2: 24 mmol/L (ref 20–29)
Calcium: 9.8 mg/dL (ref 8.7–10.3)
Chloride: 101 mmol/L (ref 96–106)
Creatinine, Ser: 0.68 mg/dL (ref 0.57–1.00)
GFR calc Af Amer: 95 mL/min/{1.73_m2} (ref >59)
GFR calc non Af Amer: 82 mL/min/{1.73_m2} (ref >59)
Globulin, Total: 2.8 g/dL (ref 1.5–4.5)
Glucose: 115 mg/dL — ABNORMAL HIGH (ref 65–99)
Potassium: 3.8 mmol/L (ref 3.5–5.2)
Sodium: 142 mmol/L (ref 134–144)
Total Protein: 7.6 g/dL (ref 6.0–8.5)

## 2019-05-05 LAB — CBC WITH DIFFERENTIAL/PLATELET
Basophils Absolute: 0 10*3/uL (ref 0.0–0.2)
Basos: 1 %
EOS (ABSOLUTE): 0 10*3/uL (ref 0.0–0.4)
Eos: 1 %
Hematocrit: 40.5 % (ref 34.0–46.6)
Hemoglobin: 13.8 g/dL (ref 11.1–15.9)
Immature Grans (Abs): 0 10*3/uL (ref 0.0–0.1)
Immature Granulocytes: 0 %
Lymphocytes Absolute: 1.6 10*3/uL (ref 0.7–3.1)
Lymphs: 31 %
MCH: 31.2 pg (ref 26.6–33.0)
MCHC: 34.1 g/dL (ref 31.5–35.7)
MCV: 92 fL (ref 79–97)
Monocytes Absolute: 0.7 10*3/uL (ref 0.1–0.9)
Monocytes: 13 %
Neutrophils Absolute: 2.8 10*3/uL (ref 1.4–7.0)
Neutrophils: 54 %
Platelets: 325 10*3/uL (ref 150–450)
RBC: 4.42 x10E6/uL (ref 3.77–5.28)
RDW: 12 % (ref 11.7–15.4)
WBC: 5.2 10*3/uL (ref 3.4–10.8)

## 2019-05-05 LAB — MAGNESIUM: Magnesium: 1.8 mg/dL (ref 1.6–2.3)

## 2019-05-05 LAB — TSH: TSH: 1.05 u[IU]/mL (ref 0.450–4.500)

## 2019-05-08 ENCOUNTER — Other Ambulatory Visit: Payer: Self-pay

## 2019-05-08 DIAGNOSIS — I10 Essential (primary) hypertension: Secondary | ICD-10-CM

## 2019-05-08 MED ORDER — METOPROLOL SUCCINATE ER 25 MG PO TB24: 50.0000 mg | ORAL_TABLET | Freq: Every day | ORAL | 3 refills | Status: DC

## 2019-06-04 ENCOUNTER — Ambulatory Visit (INDEPENDENT_AMBULATORY_CARE_PROVIDER_SITE_OTHER): Payer: Medicare Other

## 2019-06-04 VITALS — Ht 67.0 in | Wt 134.0 lb

## 2019-06-04 DIAGNOSIS — Z0000 Encounter for general adult medical examination without abnormal findings: Secondary | ICD-10-CM | POA: Diagnosis not present

## 2019-06-04 NOTE — Progress Notes (Addendum)
Subjective:   Kendra Lane is a 84 y.o. female who presents for Medicare Annual (Subsequent) preventive examination.  Virtual Visit via Telephone Note  I connected with Kendra Lane on 06/04/19 at 10:00 AM EDT by telephone and verified that I am speaking with the correct person using two identifiers.  Medicare Annual Wellness visit completed telephonically due to Covid-19 pandemic.   Location: Patient: home Provider: office   I discussed the limitations, risks, security and privacy concerns of performing an evaluation and management service by telephone and the availability of in person appointments. The patient expressed understanding and agreed to proceed.  Some vital signs may be absent or patient reported.   Clemetine Marker, LPN    Review of Systems:   Cardiac Risk Factors include: advanced age (>37men, >6 women);hypertension     Objective:     Vitals: Ht 5\' 7"  (1.702 m)   Wt 134 lb (60.8 kg)   BMI 20.99 kg/m   Body mass index is 20.99 kg/m.  Advanced Directives 06/04/2019 05/29/2018 01/03/2018 07/13/2017 05/25/2017 02/13/2017 10/07/2016  Does Patient Have a Medical Advance Directive? No Yes No No Yes No Yes  Type of Advance Directive - [place];Living will - - Living will - Living will  Copy of [place] in Chart? - No - copy requested - - - - -  Would patient like information on creating a medical advance directive? Yes (MAU/Ambulatory/Procedural Areas - Information given) - No - Patient declined - - No - Patient declined -    Tobacco Social History   Tobacco Use  Smoking Status Never Smoker  Smokeless Tobacco Never Used  Tobacco Comment   smoking cessation materials not required     Counseling given: Not Answered Comment: smoking cessation materials not required   Clinical Intake:  Pre-visit preparation completed: Yes  Pain : No/denies pain     BMI - recorded: 20.99 Nutritional Status: BMI of 19-24   Normal Nutritional Risks: None Diabetes: No  How often do you need to have someone help you when you read instructions, pamphlets, or other written materials from your doctor or pharmacy?: 1 - Never  Interpreter Needed?: No  Information entered by :: Clemetine Marker LPN  Past Medical History:  Diagnosis Date  . GERD (gastroesophageal reflux disease)   . Hypertension    Past Surgical History:  Procedure Laterality Date  . ABDOMINAL HYSTERECTOMY    . ESOPHAGOGASTRODUODENOSCOPY (EGD) WITH ESOPHAGEAL DILATION  10/2014   Dr. Allen Norris  . TOTAL VAGINAL HYSTERECTOMY  1993   Family History  Problem Relation Age of Onset  . Breast cancer Daughter 80   Social History   Socioeconomic History  . Marital status: Widowed    Spouse name: Not on file  . Number of children: 8  . Years of education: Not on file  . Highest education level: 10th grade  Occupational History  . Occupation: Retired  Scientific laboratory technician  . Financial resource strain: Not hard at all  . Food insecurity    Worry: Never true    Inability: Never true  . Transportation needs    Medical: No    Non-medical: No  Tobacco Use  . Smoking status: Never Smoker  . Smokeless tobacco: Never Used  . Tobacco comment: smoking cessation materials not required  Substance and Sexual Activity  . Alcohol use: Yes    Alcohol/week: 2.0 standard drinks    Types: 2 Cans of beer per week  . Drug use: No  .  Sexual activity: Not Currently  Lifestyle  . Physical activity    Days per week: 7 days    Minutes per session: 30 min  . Stress: Not at all  Relationships  . Social Herbalist on phone: Patient refused    Gets together: Patient refused    Attends religious service: Patient refused    Active member of club or organization: Patient refused    Attends meetings of clubs or organizations: Patient refused    Relationship status: Widowed  Other Topics Concern  . Not on file  Social History Narrative  . Not on file     Outpatient Encounter Medications as of 06/04/2019  Medication Sig  . amLODipine (NORVASC) 10 MG tablet TAKE 1 TABLET BY MOUTH DAILY.  Marland Kitchen aspirin 81 MG tablet Take 81 mg by mouth daily.  . calcium-vitamin D (OSCAL WITH D) 500-200 MG-UNIT tablet Take 1 tablet by mouth. 600 mg daily  . metoprolol succinate (TOPROL-XL) 25 MG 24 hr tablet Take 2 tablets (50 mg total) by mouth daily.  . Multiple Vitamins-Minerals (CENTRUM SILVER 50+WOMEN PO) Take 1 tablet by mouth daily.  Marland Kitchen omeprazole (PRILOSEC) 20 MG capsule Take 1 capsule (20 mg total) by mouth daily.   No facility-administered encounter medications on file as of 06/04/2019.     Activities of Daily Living In your present state of health, do you have any difficulty performing the following activities: 06/04/2019  Hearing? N  Comment declines hearing aids  Vision? N  Difficulty concentrating or making decisions? N  Walking or climbing stairs? N  Dressing or bathing? N  Doing errands, shopping? N  Preparing Food and eating ? N  Using the Toilet? N  In the past six months, have you accidently leaked urine? Y  Do you have problems with loss of bowel control? N  Managing your Medications? N  Managing your Finances? N  Housekeeping or managing your Housekeeping? N  Some recent data might be hidden    Patient Care Team: Glean Hess, MD as PCP - General (Internal Medicine)    Assessment:   This is a routine wellness examination for Kendra Lane.  Exercise Activities and Dietary recommendations Current Exercise Habits: Home exercise routine, Type of exercise: walking, Time (Minutes): 30, Frequency (Times/Week): 7, Weekly Exercise (Minutes/Week): 210, Intensity: Mild, Exercise limited by: None identified  Goals    . DIET - INCREASE WATER INTAKE     Recommend to drink at least 6-8 8oz glasses of water per day.    . Prevent Falls     Remain active and continue to prevent falls.        Fall Risk Fall Risk  06/04/2019 05/04/2019 02/13/2019  09/15/2018 05/29/2018  Falls in the past year? 0 0 0 0 No  Number falls in past yr: 0 0 0 0 -  Injury with Fall? 0 0 0 0 -  Risk for fall due to : - - - - Impaired vision;History of fall(s)  Risk for fall due to: Comment - - - - wears eyeglasses  Follow up Falls prevention discussed - Falls evaluation completed - -   FALL RISK PREVENTION PERTAINING TO THE HOME:  Any stairs in or around the home? Yes  If so, do they handrails? Yes   Home free of loose throw rugs in walkways, pet beds, electrical cords, etc? Yes  Adequate lighting in your home to reduce risk of falls? Yes   ASSISTIVE DEVICES UTILIZED TO PREVENT FALLS:  Life alert?  No  Use of a cane, walker or w/c? No  Grab bars in the bathroom? Yes  Shower chair or bench in shower? Yes  Elevated toilet seat or a handicapped toilet? Yes   DME ORDERS:  DME order needed?  No   TIMED UP AND GO:  Was the test performed? No . Telephonic visit.   Education: Fall risk prevention has been discussed.  Intervention(s) required? No   Depression Screen PHQ 2/9 Scores 06/04/2019 05/04/2019 02/13/2019 09/15/2018  PHQ - 2 Score 2 0 0 0  PHQ- 9 Score 6 - - 0     Cognitive Function MMSE - Mini Mental State Exam 04/07/2016  Orientation to time 5  Orientation to time comments 6CIT  Orientation to Place 5  Orientation to Place-comments 6CIT  Registration 3  Registration-comments 6CIT  Attention/ Calculation 5  Attention/Calculation-comments 6CIT  Recall 1  Recall-comments 6CIT  Language- name 2 objects 2  Language- name 2 objects-comments 6CIT     6CIT Screen 06/04/2019 05/29/2018 05/25/2017  What Year? 0 points 0 points 0 points  What month? 0 points 0 points 0 points  What time? 0 points 0 points 0 points  Count back from 20 0 points 0 points 0 points  Months in reverse 0 points 0 points 0 points  Repeat phrase 2 points 6 points 2 points  Total Score 2 6 2     Immunization History  Administered Date(s) Administered  . Pneumococcal  Conjugate-13 04/07/2016  . Pneumococcal Polysaccharide-23 06/26/2012    Qualifies for Shingles Vaccine? Yes . Due for Shingrix. Education has been provided regarding the importance of this vaccine. Pt has been advised to call insurance company to determine out of pocket expense. Advised may also receive vaccine at local pharmacy or Health Dept. Verbalized acceptance and understanding.  Tdap: Although this vaccine is not a covered service during a Wellness Exam, does the patient still wish to receive this vaccine today?  No .  Education has been provided regarding the importance of this vaccine. Advised may receive this vaccine at local pharmacy or Health Dept. Aware to provide a copy of the vaccination record if obtained from local pharmacy or Health Dept. Verbalized acceptance and understanding.  Flu Vaccine: Due for Flu vaccine. Does the patient want to receive this vaccine today?  No . Education has been provided regarding the importance of this vaccine but still declined. Advised may receive this vaccine at local pharmacy or Health Dept. Aware to provide a copy of the vaccination record if obtained from local pharmacy or Health Dept. Verbalized acceptance and understanding.  Pneumococcal Vaccine: Up to date   Screening Tests Health Maintenance  Topic Date Due  . TETANUS/TDAP  09/14/2019 (Originally 08/25/1954)  . INFLUENZA VACCINE  12/12/2019 (Originally 04/14/2019)  . MAMMOGRAM  11/09/2019  . DEXA SCAN  Completed  . PNA vac Low Risk Adult  Completed    Cancer Screenings:  Colorectal Screening:  No longer required.   Mammogram: Completed 11/08/18. Repeat every year.  Bone Density: Completed 06/15/18. Results reflect OSTEOPOROSIS. Repeat every 2 years.   Lung Cancer Screening: (Low Dose CT Chest recommended if Age 20-80 years, 30 pack-year currently smoking OR have quit w/in 15years.) does not qualify.   Additional Screening:  Hepatitis C Screening: no longer required  Vision  Screening: Recommended annual ophthalmology exams for early detection of glaucoma and other disorders of the eye. Is the patient up to date with their annual eye exam?  No  Who is the provider or  what is the name of the office in which the pt attends annual eye exams? Dr. Wyatt Portela at [place]: Recommended annual dental exams for proper oral hygiene  Community Resource Referral:  CRR required this visit?  No     Plan:     I have personally reviewed and addressed the Medicare Annual Wellness questionnaire and have noted the following in the patient's chart:  A. Medical and social history B. Use of alcohol, tobacco or illicit drugs  C. Current medications and supplements D. Functional ability and status E.  Nutritional status F.  Physical activity G. Advance directives H. List of other physicians I.  Hospitalizations, surgeries, and ER visits in previous 12 months J.  [place] such as hearing and vision if needed, cognitive and depression L. Referrals and appointments   In addition, I have reviewed and discussed with patient certain preventive protocols, quality metrics, and best practice recommendations. A written personalized care plan for preventive services as well as general preventive health recommendations were provided to patient.   Signed,  Clemetine Marker, LPN Nurse Health Advisor   Nurse Notes: pt doing well and appreciative of visit today. CPE next week.

## 2019-06-04 NOTE — Patient Instructions (Signed)
Kendra Lane , Thank you for taking time to come for your Medicare Wellness Visit. I appreciate your ongoing commitment to your health goals. Please review the following plan we discussed and let me know if I can assist you in the future.   Screening recommendations/referrals: Colonoscopy: no longer required Mammogram: done 11/08/18  Bone Density: done  Recommended yearly ophthalmology/optometry visit for glaucoma screening and checkup Recommended yearly dental visit for hygiene and checkup  Vaccinations: Influenza vaccine: postponed Pneumococcal vaccine: done 04/07/16 Tdap vaccine: due - please contact us if you get a cut or scrape on rust or metal Shingles vaccine: Shingrix discussed. Please contact your pharmacy for coverage information.   Advanced directives: Advance directive discussed with you today. I have provided a copy for you to complete at home and have notarized. Once this is complete please bring a copy in to our office so we can scan it into your chart.  Conditions/risks identified: Keep up the great work!  Next appointment: Please follow up in one year for your Medicare Annual Wellness visit.     Preventive Care 47 Years and Older, Female Preventive care refers to lifestyle choices and visits with your health care provider that can promote health and wellness. What does preventive care include?  A yearly physical exam. This is also called an annual well check.  Dental exams once or twice a year.  Routine eye exams. Ask your health care provider how often you should have your eyes checked.  Personal lifestyle choices, including:  Daily care of your teeth and gums.  Regular physical activity.  Eating a healthy diet.  Avoiding tobacco and drug use.  Limiting alcohol use.  Practicing safe sex.  Taking low-dose aspirin every day.  Taking vitamin and mineral supplements as recommended by your health care provider. What happens during an annual well check?  The services and screenings done by your health care provider during your annual well check will depend on your age, overall health, lifestyle risk factors, and family history of disease. Counseling  Your health care provider may ask you questions about your:  Alcohol use.  Tobacco use.  Drug use.  Emotional well-being.  Home and relationship well-being.  Sexual activity.  Eating habits.  History of falls.  Memory and ability to understand (cognition).  Work and work Statistician.  Reproductive health. Screening  You may have the following tests or measurements:  Height, weight, and BMI.  Blood pressure.  Lipid and cholesterol levels. These may be checked every 5 years, or more frequently if you are over 76 years old.  Skin check.  Lung cancer screening. You may have this screening every year starting at age 54 if you have a 30-pack-year history of smoking and currently smoke or have quit within the past 15 years.  Fecal occult blood test (FOBT) of the stool. You may have this test every year starting at age 20.  Flexible sigmoidoscopy or colonoscopy. You may have a sigmoidoscopy every 5 years or a colonoscopy every 10 years starting at age 84.  Hepatitis C blood test.  Hepatitis B blood test.  Sexually transmitted disease (STD) testing.  Diabetes screening. This is done by checking your blood sugar (glucose) after you have not eaten for a while (fasting). You may have this done every 1-3 years.  Bone density scan. This is done to screen for osteoporosis. You may have this done starting at age 63.  Mammogram. This may be done every 1-2 years. Talk to your health care  provider about how often you should have regular mammograms. Talk with your health care provider about your test results, treatment options, and if necessary, the need for more tests. Vaccines  Your health care provider may recommend certain vaccines, such as:  Influenza vaccine. This is  recommended every year.  Tetanus, diphtheria, and acellular pertussis (Tdap, Td) vaccine. You may need a Td booster every 10 years.  Zoster vaccine. You may need this after age 49.  Pneumococcal 13-valent conjugate (PCV13) vaccine. One dose is recommended after age 42.  Pneumococcal polysaccharide (PPSV23) vaccine. One dose is recommended after age 76. Talk to your health care provider about which screenings and vaccines you need and how often you need them. This information is not intended to replace advice given to you by your health care provider. Make sure you discuss any questions you have with your health care provider. Document Released: 09/26/2015 Document Revised: 05/19/2016 Document Reviewed: 07/01/2015 Elsevier Interactive Patient Education  2017 [place] Prevention in the Home Falls can cause injuries. They can happen to people of all ages. There are many things you can do to make your home safe and to help prevent falls. What can I do on the outside of my home?  Regularly fix the edges of walkways and driveways and fix any cracks.  Remove anything that might make you trip as you walk through a door, such as a raised step or threshold.  Trim any bushes or trees on the path to your home.  Use bright outdoor lighting.  Clear any walking paths of anything that might make someone trip, such as rocks or tools.  Regularly check to see if handrails are loose or broken. Make sure that both sides of any steps have handrails.  Any raised decks and porches should have guardrails on the edges.  Have any leaves, snow, or ice cleared regularly.  Use sand or salt on walking paths during winter.  Clean up any spills in your garage right away. This includes oil or grease spills. What can I do in the bathroom?  Use night lights.  Install grab bars by the toilet and in the tub and shower. Do not use towel bars as grab bars.  Use non-skid mats or decals in the tub or  shower.  If you need to sit down in the shower, use a plastic, non-slip stool.  Keep the floor dry. Clean up any water that spills on the floor as soon as it happens.  Remove soap buildup in the tub or shower regularly.  Attach bath mats securely with double-sided non-slip rug tape.  Do not have throw rugs and other things on the floor that can make you trip. What can I do in the bedroom?  Use night lights.  Make sure that you have a light by your bed that is easy to reach.  Do not use any sheets or blankets that are too big for your bed. They should not hang down onto the floor.  Have a firm chair that has side arms. You can use this for support while you get dressed.  Do not have throw rugs and other things on the floor that can make you trip. What can I do in the kitchen?  Clean up any spills right away.  Avoid walking on wet floors.  Keep items that you use a lot in easy-to-reach places.  If you need to reach something above you, use a strong step stool that has a grab bar.  Keep electrical cords out of the way.  Do not use floor polish or wax that makes floors slippery. If you must use wax, use non-skid floor wax.  Do not have throw rugs and other things on the floor that can make you trip. What can I do with my stairs?  Do not leave any items on the stairs.  Make sure that there are handrails on both sides of the stairs and use them. Fix handrails that are broken or loose. Make sure that handrails are as long as the stairways.  Check any carpeting to make sure that it is firmly attached to the stairs. Fix any carpet that is loose or worn.  Avoid having throw rugs at the top or bottom of the stairs. If you do have throw rugs, attach them to the floor with carpet tape.  Make sure that you have a light switch at the top of the stairs and the bottom of the stairs. If you do not have them, ask someone to add them for you. What else can I do to help prevent falls?   Wear shoes that:  Do not have high heels.  Have rubber bottoms.  Are comfortable and fit you well.  Are closed at the toe. Do not wear sandals.  If you use a stepladder:  Make sure that it is fully opened. Do not climb a closed stepladder.  Make sure that both sides of the stepladder are locked into place.  Ask someone to hold it for you, if possible.  Clearly mark and make sure that you can see:  Any grab bars or handrails.  First and last steps.  Where the edge of each step is.  Use tools that help you move around (mobility aids) if they are needed. These include:  Canes.  Walkers.  Scooters.  Crutches.  Turn on the lights when you go into a dark area. Replace any light bulbs as soon as they burn out.  Set up your furniture so you have a clear path. Avoid moving your furniture around.  If any of your floors are uneven, fix them.  If there are any pets around you, be aware of where they are.  Review your medicines with your doctor. Some medicines can make you feel dizzy. This can increase your chance of falling. Ask your doctor what other things that you can do to help prevent falls. This information is not intended to replace advice given to you by your health care provider. Make sure you discuss any questions you have with your health care provider. Document Released: 06/26/2009 Document Revised: 02/05/2016 Document Reviewed: 10/04/2014 Elsevier Interactive Patient Education  2017 [employer].

## 2019-06-12 ENCOUNTER — Other Ambulatory Visit: Payer: Self-pay

## 2019-06-12 ENCOUNTER — Encounter: Payer: Self-pay | Admitting: Internal Medicine

## 2019-06-12 ENCOUNTER — Ambulatory Visit (INDEPENDENT_AMBULATORY_CARE_PROVIDER_SITE_OTHER): Payer: Medicare Other | Admitting: Internal Medicine

## 2019-06-12 VITALS — BP 116/70 | HR 100 | Ht 67.0 in | Wt 131.0 lb

## 2019-06-12 DIAGNOSIS — I10 Essential (primary) hypertension: Secondary | ICD-10-CM

## 2019-06-12 DIAGNOSIS — Z0000 Encounter for general adult medical examination without abnormal findings: Secondary | ICD-10-CM | POA: Diagnosis not present

## 2019-06-12 DIAGNOSIS — K222 Esophageal obstruction: Secondary | ICD-10-CM

## 2019-06-12 DIAGNOSIS — R739 Hyperglycemia, unspecified: Secondary | ICD-10-CM

## 2019-06-12 DIAGNOSIS — Z1231 Encounter for screening mammogram for malignant neoplasm of breast: Secondary | ICD-10-CM | POA: Diagnosis not present

## 2019-06-12 LAB — POC URINALYSIS WITH MICROSCOPIC (NON AUTO)MANUAL RESULT
Bilirubin, UA: NEGATIVE
Crystals: 0
Epithelial cells, urine per micros: 0
Glucose, UA: NEGATIVE
Ketones, UA: NEGATIVE
Leukocytes, UA: NEGATIVE
Mucus, UA: 0
Nitrite, UA: NEGATIVE
Protein, UA: NEGATIVE
RBC: 2 M/uL — AB (ref 4.04–5.48)
Spec Grav, UA: 1.02 (ref 1.010–1.025)
Urobilinogen, UA: 0.2 E.U./dL
WBC Casts, UA: 0
pH, UA: 7 (ref 5.0–8.0)

## 2019-06-12 NOTE — Progress Notes (Signed)
Date:  06/12/2019   Name:  Kendra Lane   DOB:  01/20/1935   MRN:  [id number]   Chief Complaint: Annual Exam (Breast Exam. ) and Hypertension (Question about medication. Said she thought she was supposed to have amlodipine increased from 10 mg to 20 mg. Is this accurate?)  Kendra Lane is a 84 y.o. female who presents today for her Complete Annual Exam. She feels fairly well. She reports exercising walking, doing yard and housework. She reports she is sleeping fairly well. She denies breast issues.  Mammogram 10/2018 DEXA 2019 Immunizations - up to date  Hypertension This is a chronic problem. The problem is controlled. Pertinent negatives include no chest pain, headaches, palpitations or shortness of breath. Past treatments include calcium channel blockers and beta blockers (metoprolol 25 mg bid and amlodipine 10 mg once a day). The current treatment provides significant improvement. There are no compliance problems.   Gastroesophageal Reflux She reports no abdominal pain, no chest pain, no coughing or no wheezing. The problem occurs occasionally. Pertinent negatives include no fatigue. She has tried a PPI for the symptoms. Past procedures include an EGD. showing stricture.    Review of Systems  Constitutional: Negative for chills, fatigue and fever.  HENT: Negative for congestion, hearing loss, tinnitus, trouble swallowing and voice change.   Eyes: Negative for visual disturbance.  Respiratory: Negative for cough, chest tightness, shortness of breath and wheezing.   Cardiovascular: Negative for chest pain, palpitations and leg swelling.  Gastrointestinal: Negative for abdominal pain, constipation, diarrhea and vomiting.  Endocrine: Negative for polydipsia and polyuria.  Genitourinary: Negative for dysuria, frequency, genital sores, vaginal bleeding and vaginal discharge.  Musculoskeletal: Negative for arthralgias, gait problem and joint swelling.  Skin: Negative for color  change and rash.  Neurological: Negative for dizziness, tremors, light-headedness and headaches.  Hematological: Negative for adenopathy. Does not bruise/bleed easily.  Psychiatric/Behavioral: Negative for dysphoric mood and sleep disturbance. The patient is not nervous/anxious.     Patient Active Problem List   Diagnosis Date Noted  . Osteoporosis 06/15/2018  . Arthritis of shoulder 11/30/2017  . Bigeminy 04/07/2016  . Benign esophageal stricture 01/21/2015  . Essential (primary) hypertension 01/21/2015  . Hammer toe 01/21/2015  . Fatty tumor 01/21/2015    No Known Allergies  Past Surgical History:  Procedure Laterality Date  . ABDOMINAL HYSTERECTOMY    . ESOPHAGOGASTRODUODENOSCOPY (EGD) WITH ESOPHAGEAL DILATION  10/2014   Dr. Allen Norris  . TOTAL VAGINAL HYSTERECTOMY  1993    Social History   Tobacco Use  . Smoking status: Never Smoker  . Smokeless tobacco: Never Used  . Tobacco comment: smoking cessation materials not required  Substance Use Topics  . Alcohol use: Yes    Alcohol/week: 2.0 standard drinks    Types: 2 Cans of beer per week  . Drug use: No     Medication list has been reviewed and updated.  Current Meds  Medication Sig  . amLODipine (NORVASC) 10 MG tablet TAKE 1 TABLET BY MOUTH DAILY.  Marland Kitchen aspirin 81 MG tablet Take 81 mg by mouth daily.  . calcium-vitamin D (OSCAL WITH D) 500-200 MG-UNIT tablet Take 1 tablet by mouth. 600 mg daily  . metoprolol succinate (TOPROL-XL) 25 MG 24 hr tablet Take 2 tablets (50 mg total) by mouth daily.  . Multiple Vitamins-Minerals (CENTRUM SILVER 50+WOMEN PO) Take 1 tablet by mouth daily.  Marland Kitchen omeprazole (PRILOSEC) 20 MG capsule Take 1 capsule (20 mg total) by mouth daily.  PHQ 2/9 Scores 06/12/2019 06/04/2019 05/04/2019 02/13/2019  PHQ - 2 Score 2 2 0 0  PHQ- 9 Score 2 6 - -    BP Readings from Last 3 Encounters:  06/12/19 116/70  05/04/19 132/76  02/13/19 112/64    Physical Exam Vitals signs and nursing note reviewed.   Constitutional:      General: She is not in acute distress.    Appearance: She is well-developed.  HENT:     Head: Normocephalic and atraumatic.     Right Ear: External ear normal. There is impacted cerumen.     Left Ear: External ear normal. There is impacted cerumen.     Nose:     Right Sinus: No maxillary sinus tenderness.     Left Sinus: No maxillary sinus tenderness.  Eyes:     General: No scleral icterus.       Right eye: No discharge.        Left eye: No discharge.     Conjunctiva/sclera: Conjunctivae normal.  Neck:     Musculoskeletal: Normal range of motion. No erythema.     Thyroid: No thyromegaly.     Vascular: No carotid bruit.  Cardiovascular:     Rate and Rhythm: Normal rate and regular rhythm.     Pulses: Normal pulses.     Heart sounds: Normal heart sounds.  Pulmonary:     Effort: Pulmonary effort is normal. No respiratory distress.     Breath sounds: No wheezing.  Chest:     Breasts:        Right: No mass, nipple discharge, skin change or tenderness.        Left: No mass, nipple discharge, skin change or tenderness.  Abdominal:     General: Bowel sounds are normal.     Palpations: Abdomen is soft.     Tenderness: There is no abdominal tenderness.  Musculoskeletal: Normal range of motion.     Right lower leg: No edema.     Left lower leg: No edema.  Lymphadenopathy:     Cervical: No cervical adenopathy.  Skin:    General: Skin is warm and dry.     Capillary Refill: Capillary refill takes less than 2 seconds.     Findings: No rash.  Neurological:     General: No focal deficit present.     Mental Status: She is alert and oriented to person, place, and time.     Cranial Nerves: No cranial nerve deficit.     Sensory: No sensory deficit.     Deep Tendon Reflexes: Reflexes are normal and symmetric.  Psychiatric:        Speech: Speech normal.        Behavior: Behavior normal.        Thought Content: Thought content normal.     Wt Readings from Last 3  Encounters:  06/12/19 131 lb (59.4 kg)  06/04/19 134 lb (60.8 kg)  05/04/19 132 lb (59.9 kg)    BP 116/70   Pulse 100   Ht 5\' 7"  (1.702 m)   Wt 131 lb (59.4 kg)   SpO2 99%   BMI 20.52 kg/m   Assessment and Plan: 1. Annual physical exam Normal exam Continue healthy diet, regular physical activity - Lipid panel  2. Encounter for screening mammogram for breast cancer - MM 3D SCREEN BREAST BILATERAL; Future  3. Essential (primary) hypertension Clinically stable exam with well controlled BP.   Tolerating medications, metoprolol 25 mg bid and amlodipine 10 mg daily,  without side effects at this time. Pt to continue current regimen and low sodium diet; benefits of regular exercise as able discussed. - Basic metabolic panel  4. Elevated blood sugar Will check A1C as glucose has been elevated on the last 3/4 labs tests - Hemoglobin A1c  5. Benign esophageal stricture Symptoms well controlled on daily PPI No red flag signs such as weight loss, n/v, melena Will continue omeprazole daily otc.    Partially dictated using Editor, commissioning. Any errors are unintentional.  Halina Maidens, MD [facility]  06/12/2019

## 2019-06-13 LAB — BASIC METABOLIC PANEL
BUN/Creatinine Ratio: 16 (ref 12–28)
BUN: 11 mg/dL (ref 8–27)
CO2: 24 mmol/L (ref 20–29)
Calcium: 10 mg/dL (ref 8.7–10.3)
Chloride: 101 mmol/L (ref 96–106)
Creatinine, Ser: 0.69 mg/dL (ref 0.57–1.00)
GFR calc Af Amer: 94 mL/min/{1.73_m2} (ref >59)
GFR calc non Af Amer: 82 mL/min/{1.73_m2} (ref >59)
Glucose: 122 mg/dL — ABNORMAL HIGH (ref 65–99)
Potassium: 3.7 mmol/L (ref 3.5–5.2)
Sodium: 143 mmol/L (ref 134–144)

## 2019-06-13 LAB — HEMOGLOBIN A1C
Est. average glucose Bld gHb Est-mCnc: 117 mg/dL
Hgb A1c MFr Bld: 5.7 % — ABNORMAL HIGH (ref 4.8–5.6)

## 2019-06-13 LAB — LIPID PANEL
Chol/HDL Ratio: 2 ratio (ref 0.0–4.4)
Cholesterol, Total: 242 mg/dL — ABNORMAL HIGH (ref 100–199)
HDL: 123 mg/dL (ref >39)
LDL Chol Calc (NIH): 105 mg/dL — ABNORMAL HIGH (ref 0–99)
Triglycerides: 86 mg/dL (ref 0–149)
VLDL Cholesterol Cal: 14 mg/dL (ref 5–40)

## 2019-06-18 ENCOUNTER — Other Ambulatory Visit: Payer: Self-pay | Admitting: Internal Medicine

## 2019-06-18 DIAGNOSIS — K222 Esophageal obstruction: Secondary | ICD-10-CM

## 2019-08-29 DIAGNOSIS — I10 Essential (primary) hypertension: Secondary | ICD-10-CM | POA: Diagnosis not present

## 2019-08-29 DIAGNOSIS — R531 Weakness: Secondary | ICD-10-CM | POA: Diagnosis not present

## 2019-08-29 DIAGNOSIS — I498 Other specified cardiac arrhythmias: Secondary | ICD-10-CM | POA: Diagnosis not present

## 2019-08-29 DIAGNOSIS — R002 Palpitations: Secondary | ICD-10-CM | POA: Diagnosis not present

## 2019-08-29 DIAGNOSIS — R079 Chest pain, unspecified: Secondary | ICD-10-CM | POA: Diagnosis not present

## 2019-09-04 ENCOUNTER — Other Ambulatory Visit: Payer: Self-pay | Admitting: Internal Medicine

## 2019-09-04 DIAGNOSIS — I10 Essential (primary) hypertension: Secondary | ICD-10-CM

## 2019-09-19 ENCOUNTER — Telehealth: Payer: Self-pay

## 2019-09-19 NOTE — Telephone Encounter (Signed)
Started Sunday with high Bps. No changes in medications. Uses a arm cuff. What do you recommend? She does take her pressure at rest with her arm elevated.

## 2019-09-19 NOTE — Telephone Encounter (Signed)
Patient informed and will do 

## 2019-09-19 NOTE — Telephone Encounter (Signed)
Her last BPs here were very good.  How long has she been having BP that elevated? Has she changed any of her previous medications?

## 2019-09-19 NOTE — Telephone Encounter (Signed)
Check BP once a day through the weekend.  If still elevated, make an appt next week to see me.

## 2019-09-19 NOTE — Telephone Encounter (Signed)
Patient called saying her BP is staying elevated around 170s/87. She said she also has a dull headache. She wants to know what you recommend or should she make a change in how she takes her medication?  Please advise.

## 2019-10-21 ENCOUNTER — Ambulatory Visit
Admission: EM | Admit: 2019-10-21 | Discharge: 2019-10-21 | Disposition: A | Discharge status: Home / Self Care | Payer: Medicare Other | Attending: Emergency Medicine | Admitting: Emergency Medicine

## 2019-10-21 ENCOUNTER — Other Ambulatory Visit: Payer: Self-pay

## 2019-10-21 ENCOUNTER — Encounter: Payer: Self-pay | Admitting: Emergency Medicine

## 2019-10-21 DIAGNOSIS — R450 Nervousness: Secondary | ICD-10-CM

## 2019-10-21 DIAGNOSIS — F411 Generalized anxiety disorder: Secondary | ICD-10-CM

## 2019-10-21 LAB — TSH: TSH: 0.646 u[IU]/mL (ref 0.350–4.500)

## 2019-10-21 MED ORDER — HYDROXYZINE HCL 25 MG PO TABS: 25.0000 mg | ORAL_TABLET | Freq: Three times a day (TID) | ORAL | 0 refills | Status: DC | PRN

## 2019-10-21 NOTE — Discharge Instructions (Signed)
The Vistaril will help with your anxiety.  Take it only if your usual measures, such as calling a friend, exercising, or relaxing/meditating do not work.  I will call you only if your TSH is abnormal if it is normal, I would not contact you.  If it is abnormal then you will need to follow-up with Dr. Army Melia ASAP to have this addressed.  It would be prudent to follow-up with Dr. Army Melia if your anxiety persists.  You may need a long-term medication.   Please keep an eye on your blood pressure. It is important to keep your blood pressure under good control, as having a elevated blood pressure for prolonged periods of time significantly increases your risk of stroke, heart attacks, kidney damage, eye damage, and other problems. Return immediately to the ER if you start having chest pain, headache, problems seeing, problems talking, problems walking, if you feel like you're about to pass out, if you do pass out, if you have a seizure, or for any other concerns.  Go to www.goodrx.com to look up your medications. This will give you a list of where you can find your prescriptions at the most affordable prices. Or ask the pharmacist what the cash price is, or if they have any other discount programs available to help make your medication more affordable. This can be less expensive than what you would pay with insurance.

## 2019-10-21 NOTE — ED Provider Notes (Signed)
HPI  SUBJECTIVE:  Kendra Lane is a 85 y.o. female who presents with about 6 weeks of anxiety.  She describes it as being nervous, upset.  States it is present most days, and usually lasts about 30 minutes.  She was seen in the ER in December  with anxiety and palpitations which was ultimately thought to be due to anxiety.  Work-up was unremarkable.  Symptoms are better with rest, relaxing, talking others, going out for a walk and getting out of her home.  She has not tried anything else for this.  Symptoms are worse when she gets upset or "when I think about things".  She denies unintentional weight loss, heat/ cold intolerance, nausea, vomiting, chest pain, cough, hemoptysis, shortness of breath, palpitations, syncope.  No impending sense of doom.  She states that she is by herself a lot, her son who lives with her is working more.  She has a past medical history of hypertension.  No history of anxiety, diabetes, thyroid disease, depression, chronic kidney disease, other mental illness.  [id number], Jesse Sans, MD .   Past Medical History:  Diagnosis Date  . GERD (gastroesophageal reflux disease)   . Hypertension     Past Surgical History:  Procedure Laterality Date  . ABDOMINAL HYSTERECTOMY    . ESOPHAGOGASTRODUODENOSCOPY (EGD) WITH ESOPHAGEAL DILATION  10/2014   Dr. Allen Norris  . TOTAL VAGINAL HYSTERECTOMY  1993    Family History  Problem Relation Age of Onset  . Breast cancer Daughter 58    Social History   Tobacco Use  . Smoking status: Never Smoker  . Smokeless tobacco: Never Used  . Tobacco comment: smoking cessation materials not required  Substance Use Topics  . Alcohol use: Yes    Alcohol/week: 2.0 standard drinks    Types: 2 Cans of beer per week  . Drug use: No    No current facility-administered medications for this encounter.  Current Outpatient Medications:  .  amLODipine (NORVASC) 10 MG tablet, TAKE 1 TABLET BY MOUTH DAILY, Disp: 90 tablet, Rfl: 3 .  aspirin  81 MG tablet, Take 81 mg by mouth daily., Disp: , Rfl:  .  calcium-vitamin D (OSCAL WITH D) 500-200 MG-UNIT tablet, Take 1 tablet by mouth. 600 mg daily, Disp: , Rfl:  .  metoprolol succinate (TOPROL-XL) 25 MG 24 hr tablet, Take 2 tablets (50 mg total) by mouth daily., Disp: 90 tablet, Rfl: 3 .  Multiple Vitamins-Minerals (CENTRUM SILVER 50+WOMEN PO), Take 1 tablet by mouth daily., Disp: , Rfl:  .  omeprazole (PRILOSEC) 20 MG capsule, TAKE 1 CAPSULE(20 MG) BY MOUTH DAILY, Disp: 90 capsule, Rfl: 3 .  hydrOXYzine (ATARAX/VISTARIL) 25 MG tablet, Take 1 tablet (25 mg total) by mouth every 8 (eight) hours as needed for anxiety., Disp: 20 tablet, Rfl: 0  No Known Allergies   ROS  As noted in HPI.   Physical Exam  BP (!) 157/90 (BP Location: Right Arm)   Pulse 98   Temp 98.5 F (36.9 C) (Oral)   Resp 14   Ht 5\' 7"  (1.702 m)   Wt 59.4 kg   SpO2 100%   BMI 20.52 kg/m   Constitutional: Well developed, well nourished, no acute distress Eyes:  EOMI, conjunctiva normal bilaterally HENT: Normocephalic, atraumatic,mucus membranes moist Respiratory: Normal inspiratory effort, lungs clear bilaterally good air movement Cardiovascular: Normal rate no murmurs rubs or gallops Endocrine.  No thyroid tenderness, thyromegaly. GI: nondistended skin: No rash, skin intact Musculoskeletal: no deformities Neurologic: Alert & oriented  x 3, no focal neuro deficits.  DTRs upper and lower extremities 1+ and equal bilaterally. Psychiatric: Speech and behavior appropriate   ED Course   Medications - No data to display  Orders Placed This Encounter  Procedures  . TSH    Standing Status:   Standing    Number of Occurrences:   1    Results for orders placed or performed during the hospital encounter of 10/21/19 (from the past 24 hour(s))  TSH     Status: None   Collection Time: 10/21/19  1:02 PM  Result Value Ref Range   TSH 0.646 0.350 - 4.500 uIU/mL   No results found.  ED Clinical  Impression  1. Anxiety state      ED Assessment/Plan  Outside ER records reviewed.  As noted in HPI.  Per ED records, normal kidney function on 08/29/2019.  Suspect anxiety.  Will send home with 25 mg of Vistaril every 6-8 hours as needed  Discussed with her only take this if her usual measures such as calling a friend, exercising and meditating does not work.  Advised her to be very sparing with this.  Advised her that this may be sedating and can increase her risk of fall.  We will also check a TSH.  Will call patient only if abnormal.  We will have her follow-up with Dr. Army Melia if it is abnormal.  TSH normal.  Initial blood pressure noted.  Repeated blood pressure was much more acceptable.  Patient denies any CV sx such as CP, dyspnea, palpitations, pedal edema, tearing pain radiating to back or abd. Pt denied any renal sx such as anuria or hematuria.  We will have her keep an eye on this.  Discussed MDM, treatment plan, and plan for follow-up with patient. Discussed sn/sx that should prompt return to the ED. patient agrees with plan.   Meds ordered this encounter  Medications  . hydrOXYzine (ATARAX/VISTARIL) 25 MG tablet    Sig: Take 1 tablet (25 mg total) by mouth every 8 (eight) hours as needed for anxiety.    Dispense:  20 tablet    Refill:  0    *This clinic note was created using [employer]. Therefore, there may be occasional mistakes despite careful proofreading.   ?   Melynda Ripple, MD 10/22/19 [phone]

## 2019-10-21 NOTE — ED Triage Notes (Signed)
Patient states that she started feeling anxious over the past month.  Patient denies any cold symptoms.  Patient denies prior history of anxiety.  Patient denies any stressful events.

## 2019-10-29 ENCOUNTER — Telehealth: Payer: Self-pay

## 2019-10-29 NOTE — Telephone Encounter (Signed)
As long as she does not have a fever, cough, SOB, vomiting or new vision changes she can get the vaccine.  She can try taking tylenol for the neck pain.  If it continues, she may need an OV.

## 2019-10-29 NOTE — Telephone Encounter (Signed)
Patient informed. 

## 2019-10-29 NOTE — Telephone Encounter (Signed)
Pt called wanting your advice. She said she is having pain in the back of her neck into her head for about a week now.   She wants to be sure you feel she is safe to take her first Covid vaccine shot this Saturday even though she is having this neck pain??  Please advise.

## 2019-11-01 ENCOUNTER — Other Ambulatory Visit: Payer: Self-pay | Admitting: Internal Medicine

## 2019-11-01 DIAGNOSIS — I10 Essential (primary) hypertension: Secondary | ICD-10-CM

## 2019-11-13 ENCOUNTER — Ambulatory Visit
Admission: RE | Admit: 2019-11-13 | Discharge: 2019-11-13 | Disposition: A | Discharge status: Home / Self Care | Payer: Medicare Other | Source: Ambulatory Visit | Attending: Internal Medicine | Admitting: Internal Medicine

## 2019-11-13 ENCOUNTER — Other Ambulatory Visit: Payer: Self-pay

## 2019-11-13 DIAGNOSIS — Z1231 Encounter for screening mammogram for malignant neoplasm of breast: Secondary | ICD-10-CM | POA: Insufficient documentation

## 2019-11-22 ENCOUNTER — Encounter: Payer: Self-pay | Admitting: Emergency Medicine

## 2019-11-22 ENCOUNTER — Ambulatory Visit
Admission: EM | Admit: 2019-11-22 | Discharge: 2019-11-22 | Disposition: A | Discharge status: Home / Self Care | Payer: Medicare Other | Attending: Urgent Care | Admitting: Urgent Care

## 2019-11-22 ENCOUNTER — Other Ambulatory Visit: Payer: Self-pay

## 2019-11-22 DIAGNOSIS — M62838 Other muscle spasm: Secondary | ICD-10-CM | POA: Diagnosis not present

## 2019-11-22 DIAGNOSIS — R519 Headache, unspecified: Secondary | ICD-10-CM

## 2019-11-22 MED ORDER — BACLOFEN 5 MG PO TABS: 5.0000 mg | ORAL_TABLET | Freq: Two times a day (BID) | ORAL | 0 refills | Status: DC | PRN

## 2019-11-22 NOTE — Discharge Instructions (Signed)
It was very nice seeing you today in clinic. Thank you for entrusting me with your care.   Stretch as much as possible. Use muscle relaxers; be very careful as they can make you sleepy. I am starting with a small dose. May continue Tylenol as needed.   Make arrangements to follow up with your regular doctor in 1 week for re-evaluation if not improving. If your symptoms/condition worsens, please seek follow up care either here or in the ER. Please remember, our [place] providers are "right here with you" when you need [place].   Again, it was my pleasure to take care of you today. Thank you for choosing our clinic. I hope that you start to feel better quickly.   Honor Loh, MSN, APRN, FNP-C, CEN Advanced Practice Provider [facility]

## 2019-11-22 NOTE — ED Provider Notes (Signed)
[place], [place]   Name: Kendra Lane DOB: 07-11-1935 MRN: [id number] CSN: [id number] PCP: Glean Hess, MD  Arrival date and time:  11/22/19 1040  Chief Complaint:  Headache   NOTE: Prior to seeing the patient today, I have reviewed the triage nursing documentation and vital signs. Clinical staff has updated patient's PMH/PSHx, current medication list, and drug allergies/intolerances to ensure comprehensive history available to assist in medical decision making.   History:   HPI: Kendra Lane is a 85 y.o. female who presents today with complaints of pain in the LEFT side of her head and neck. Pain has been intermittent over the last 3 weeks. She notes that this pain causes her to have sharp pains in other parts of her head that only last for a few seconds. She has intermittent headaches from time to time when her neck hurts. Patient denies focal weakness, dizziness, changes to her vision, AMS, ataxia, or difficulties with her speech. Pain has improved since onset, however patient thought that it may be time to have it looked at. Patient denies any other symptoms today. She reports that she is feeling generally well. Blood pressure is elevated at 170/87, however patient has not taken her daily prescribed anti-hypertensives. PMH (+) anxiety.   Past Medical History:  Diagnosis Date  . GERD (gastroesophageal reflux disease)   . Hypertension     Past Surgical History:  Procedure Laterality Date  . ABDOMINAL HYSTERECTOMY    . ESOPHAGOGASTRODUODENOSCOPY (EGD) WITH ESOPHAGEAL DILATION  10/2014   Dr. Allen Norris  . TOTAL VAGINAL HYSTERECTOMY  1993    Family History  Problem Relation Age of Onset  . Breast cancer Daughter 62    Social History   Tobacco Use  . Smoking status: Never Smoker  . Smokeless tobacco: Never Used  . Tobacco comment: smoking cessation materials not required  Substance Use Topics  . Alcohol use: Yes    Alcohol/week: 2.0 standard drinks    Types: 2 Cans  of beer per week  . Drug use: No    Patient Active Problem List   Diagnosis Date Noted  . Osteoporosis 06/15/2018  . Arthritis of shoulder 11/30/2017  . Bigeminy 04/07/2016  . Benign esophageal stricture 01/21/2015  . Essential (primary) hypertension 01/21/2015  . Hammer toe 01/21/2015  . Fatty tumor 01/21/2015    Home Medications:    Current Meds  Medication Sig  . amLODipine (NORVASC) 10 MG tablet TAKE 1 TABLET BY MOUTH DAILY  . aspirin 81 MG tablet Take 81 mg by mouth daily.  . calcium-vitamin D (OSCAL WITH D) 500-200 MG-UNIT tablet Take 1 tablet by mouth. 600 mg daily  . hydrOXYzine (ATARAX/VISTARIL) 25 MG tablet Take 1 tablet (25 mg total) by mouth every 8 (eight) hours as needed for anxiety.  . metoprolol succinate (TOPROL-XL) 25 MG 24 hr tablet TAKE 2 TABLETS(50 MG) BY MOUTH DAILY  . Multiple Vitamins-Minerals (CENTRUM SILVER 50+WOMEN PO) Take 1 tablet by mouth daily.  Marland Kitchen omeprazole (PRILOSEC) 20 MG capsule TAKE 1 CAPSULE(20 MG) BY MOUTH DAILY    Allergies:   Patient has no known allergies.  Review of Systems (ROS):  Review of systems NEGATIVE unless otherwise noted in narrative H&P section.   Vital Signs: Today's Vitals   11/22/19 1055 11/22/19 1059  BP:  (!) 170/87  Pulse:  81  Resp:  18  Temp:  98.4 F (36.9 C)  TempSrc:  Oral  SpO2:  100%  Weight: 132 lb (59.9 kg)   Height: 5'  6" (1.676 m)   PainSc: 0-No pain     Physical Exam: Physical Exam  Constitutional: She is oriented to person, place, and time and well-developed, well-nourished, and in no distress.  HENT:  Head: Normocephalic and atraumatic.  Eyes: Pupils are equal, round, and reactive to light.  Neck:    Cardiovascular: Normal rate, regular rhythm, normal heart sounds and intact distal pulses.  Pulmonary/Chest: Effort normal and breath sounds normal.  Musculoskeletal:     Cervical back: Normal range of motion and neck supple. Muscular tenderness present.       Back:  Neurological: She  is alert and oriented to person, place, and time. She has normal sensation, normal strength, normal reflexes and intact cranial nerves. Gait normal.  Skin: Skin is warm and dry. No rash noted. She is not diaphoretic.  Psychiatric: Mood, memory, affect and judgment normal.  Nursing note and vitals reviewed.   Urgent Care Treatments / Results:   No orders of the defined types were placed in this encounter.   LABS: PLEASE NOTE: all labs that were ordered this encounter are listed, however only abnormal results are displayed. Labs Reviewed - No data to display  EKG: -None  RADIOLOGY: No results found.  PROCEDURES: Procedures  MEDICATIONS RECEIVED THIS VISIT: Medications - No data to display  PERTINENT CLINICAL COURSE NOTES/UPDATES:   Initial Impression / Assessment and Plan / Urgent Care Course:  Pertinent labs & imaging results that were available during my care of the patient were personally reviewed by me and considered in my medical decision making (see lab/imaging section of note for values and interpretations).  Kendra Lane is a 85 y.o. female who presents to [facility] today with complaints of Headache  Patient is well appearing overall in clinic today. She does not appear to be in any acute distress. Presenting symptoms (see HPI) and exam as documented above. Patient with area of definitive spasm noted overlying the LEFT lateral and poster neck (SCM and trapezius). Suspect this is related to sleep positioning. She has pain that comes and goes. Pain is not present at time of visit. Neurological exam is grossly normal; no focal neurological concerns. Patient already taking IBU and APAP. Will add low dose (5mg ) Baclofen BID PRN to help with short term muscle spasm. May require up titrate, however given her age, I am starting with the lowest possible dose. She was educated on complimentary modalities to help with her pain. Patient encouraged stretch and avoid heavy  lifting. She was advised to apply moist heat TID-QID for at least 15-20 minutes at a time; written information provided on today's AVS.  Discussed follow up with primary care physician in 1 week for re-evaluation. I have reviewed the follow up and strict return precautions for any new or worsening symptoms. Patient is aware of symptoms that would be deemed urgent/emergent, and would thus require further evaluation either here or in the emergency department. At the time of discharge, she verbalized understanding and consent with the discharge plan as it was reviewed with her. All questions were fielded by provider and/or clinic staff prior to patient discharge.    Final Clinical Impressions / Urgent Care Diagnoses:   Final diagnoses:  Trapezius muscle spasm  Nonintractable episodic headache, unspecified headache type    New Prescriptions:  [place] Controlled Substance Registry consulted? Not Applicable  Meds ordered this encounter  Medications  . baclofen 5 MG TABS    Sig: Take 5 mg by mouth 2 (two) times daily  as needed for muscle spasms.    Dispense:  30 tablet    Refill:  0    Recommended Follow up Care:  Patient encouraged to follow up with the following provider within the specified time frame, or sooner as dictated by the severity of her symptoms. As always, she was instructed that for any urgent/emergent care needs, she should seek care either here or in the emergency department for more immediate evaluation.  Follow-up Information    Glean Hess, MD In 1 week.   Specialty: Internal Medicine Why: General reassessment of symptoms if not improving Contact information: [address] [place] [address] [phone]         NOTE: This note was prepared using Dragon dictation software along with smaller phrase technology. Despite my best ability to proofread, there is the potential that transcriptional errors may still occur from this process, and are completely  unintentional.    Karen Kitchens, NP 11/22/19 1433

## 2019-11-22 NOTE — ED Triage Notes (Signed)
Pt c/o pains in her head in different locations of her head intermittently. Started about 3 weeks ago. She states the pain is less intense than it was when it started. She has noticed it occurs when she is more anxious about something.  Denies weakness, slurred speech, blurred vision.

## 2019-12-07 ENCOUNTER — Ambulatory Visit
Admission: EM | Admit: 2019-12-07 | Discharge: 2019-12-07 | Disposition: A | Discharge status: Home / Self Care | Payer: Medicare Other | Attending: Urgent Care | Admitting: Urgent Care

## 2019-12-07 ENCOUNTER — Other Ambulatory Visit: Payer: Self-pay

## 2019-12-07 DIAGNOSIS — R030 Elevated blood-pressure reading, without diagnosis of hypertension: Secondary | ICD-10-CM

## 2019-12-07 DIAGNOSIS — I10 Essential (primary) hypertension: Secondary | ICD-10-CM

## 2019-12-07 DIAGNOSIS — F418 Other specified anxiety disorders: Secondary | ICD-10-CM

## 2019-12-07 DIAGNOSIS — M546 Pain in thoracic spine: Secondary | ICD-10-CM

## 2019-12-07 DIAGNOSIS — F064 Anxiety disorder due to known physiological condition: Secondary | ICD-10-CM | POA: Diagnosis not present

## 2019-12-07 MED ORDER — MELOXICAM 15 MG PO TABS: 15.0000 mg | ORAL_TABLET | Freq: Every day | ORAL | 0 refills | Status: DC

## 2019-12-07 MED ORDER — TRAMADOL-ACETAMINOPHEN 37.5-325 MG PO TABS: 1.0000 | ORAL_TABLET | Freq: Three times a day (TID) | ORAL | 0 refills | Status: DC | PRN

## 2019-12-07 NOTE — Discharge Instructions (Signed)
It was very nice seeing you today in clinic. Thank you for entrusting me with your care.   Rest and avoid overdoing it. Stretch to help improve your pain. Suspect arthritic pain. May continue Solonpas patches. Add anti-inflammatory daily. May use Ultracet as needed for pain; be careful as it can make you sleepy. Apply moist heat several times a day for 15-20 minutes at at time.   Make arrangements to follow up with your regular doctor in 1 week for re-evaluation if not improving. If your symptoms/condition worsens, please seek follow up care either here or in the ER. Please remember, our [place] providers are "right here with you" when you need [place].   Again, it was my pleasure to take care of you today. Thank you for choosing our clinic. I hope that you start to feel better quickly.   Honor Loh, MSN, APRN, FNP-C, CEN Advanced Practice Provider [facility]

## 2019-12-07 NOTE — ED Triage Notes (Signed)
Pt presents with c/o left-sided mid-back pain. Pt denies any known injury to the area. Pt states she has some pain with movement. Pt does have full ROM to shoulder.

## 2019-12-08 NOTE — ED Provider Notes (Addendum)
[place], [place]   Name: Kendra Lane DOB: [birth year] MRN: [id number] CSN: [id number] PCP: Glean Hess, MD  Arrival date and time:  12/07/19 1118  Chief Complaint:  Back Pain  NOTE: Prior to seeing the patient today, I have reviewed the triage nursing documentation and vital signs. Clinical staff has updated patient's PMH/PSHx, current medication list, and drug allergies/intolerances to ensure comprehensive history available to assist in medical decision making.   History:   HPI: Kendra Lane is a 85 y.o. female who presents today with complaints of thoracic back pain that began on Monday (12/03/2019). Patient denies injury/trauma; no falls. Pain is mainly in the RIGHT side of her back inferior and lateral to the scapular. Patient notes that the pain is present at rest and with exertion. She has not experienced any chest pain, shortness of breath, palpitations, nausea/vomiting, or diaphoresis. She has not appreciated any weakness or distal paraesthesias in her arms or legs. Patient notes that pain is exacerbated by prolonged sitting, ambulation, lifting, and torso twisting. She has attempted conservative management at home using APAP, IBU and Solonpas patches, which has done little to improve her symptoms. Of note, patient was seen here (by me) on 11/22/2019 for contralateral SCM and trapezius pain secondary to spasms. She was prescribed low dose Baclofen, which fully resolved her symptoms. Patient had a retained supply of the muscle relaxer and has used it as prescribed to help with the pain she is now experiencing on the RIGHT side of her back. She notes that it has not helped to improve her pain. PMH does not include any previous back injuries or surgeries. Patient presents again today with an elevated blood pressure of 183/96. She reports that she has taken her antihypertensives today. She attributes blood pressure elevations to her being in pain. Self rates pain 8/10 in clinic  today.   Past Medical History:  Diagnosis Date  . GERD (gastroesophageal reflux disease)   . Hypertension     Past Surgical History:  Procedure Laterality Date  . ABDOMINAL HYSTERECTOMY    . ESOPHAGOGASTRODUODENOSCOPY (EGD) WITH ESOPHAGEAL DILATION  10/2014   Dr. Allen Norris  . TOTAL VAGINAL HYSTERECTOMY  1993    Family History  Problem Relation Age of Onset  . Breast cancer Daughter 47    Social History   Tobacco Use  . Smoking status: Never Smoker  . Smokeless tobacco: Never Used  . Tobacco comment: smoking cessation materials not required  Substance Use Topics  . Alcohol use: Yes    Alcohol/week: 2.0 standard drinks    Types: 2 Cans of beer per week  . Drug use: No   Patient Active Problem List   Diagnosis Date Noted  . Osteoporosis 06/15/2018  . Arthritis of shoulder 11/30/2017  . Bigeminy 04/07/2016  . Benign esophageal stricture 01/21/2015  . Essential (primary) hypertension 01/21/2015  . Hammer toe 01/21/2015  . Fatty tumor 01/21/2015    Home Medications:    Current Meds  Medication Sig  . amLODipine (NORVASC) 10 MG tablet TAKE 1 TABLET BY MOUTH DAILY  . aspirin 81 MG tablet Take 81 mg by mouth daily.  . baclofen 5 MG TABS Take 5 mg by mouth 2 (two) times daily as needed for muscle spasms.  . calcium-vitamin D (OSCAL WITH D) 500-200 MG-UNIT tablet Take 1 tablet by mouth. 600 mg daily  . hydrOXYzine (ATARAX/VISTARIL) 25 MG tablet Take 1 tablet (25 mg total) by mouth every 8 (eight) hours as needed for anxiety.  Marland Kitchen  metoprolol succinate (TOPROL-XL) 25 MG 24 hr tablet TAKE 2 TABLETS(50 MG) BY MOUTH DAILY  . Multiple Vitamins-Minerals (CENTRUM SILVER 50+WOMEN PO) Take 1 tablet by mouth daily.  Marland Kitchen omeprazole (PRILOSEC) 20 MG capsule TAKE 1 CAPSULE(20 MG) BY MOUTH DAILY    Allergies:   Patient has no known allergies.  Review of Systems (ROS):  Review of systems NEGATIVE unless otherwise noted in narrative H&P section.   Vital Signs: Today's Vitals   12/07/19  1128 12/07/19 1129 12/07/19 1131 12/07/19 1156  BP:   (!) 183/96 (!) 174/91  Pulse:   (!) 124 99  Resp:   18   Temp:   98 F (36.7 C)   TempSrc:   Oral   SpO2:   100% 100%  Weight:  132 lb (59.9 kg)    Height:  _0  (1.702 m)    PainSc: 8        Physical Exam: Physical Exam  Constitutional: She is oriented to person, place, and time and well-developed, well-nourished, and in no distress.  HENT:  Head: Normocephalic and atraumatic.  Eyes: Pupils are equal, round, and reactive to light.  Cardiovascular: Normal rate, regular rhythm, normal heart sounds and intact distal pulses.  Pulmonary/Chest: Effort normal and breath sounds normal.  Musculoskeletal:     Cervical back: Full passive range of motion without pain.     Thoracic back: Pain and tenderness present. No swelling, deformity or spasms.       Back:     Comments: No midline neck/back pain or gross deformities.   Neurological: She is alert and oriented to person, place, and time. She has normal sensation, normal strength and normal reflexes. Gait normal.  Skin: Skin is warm and dry. No rash noted. She is not diaphoretic.  Psychiatric: Memory, affect and judgment normal. Her mood appears anxious.  Nursing note and vitals reviewed.   Urgent Care Treatments / Results:   Orders Placed This Encounter  Procedures  . Recheck vitals    LABS: PLEASE NOTE: all labs that were ordered this encounter are listed, however only abnormal results are displayed. Labs Reviewed - No data to display  EKG: -None  RADIOLOGY: No results found.  PROCEDURES: Procedures  MEDICATIONS RECEIVED THIS VISIT: Medications - No data to display  PERTINENT CLINICAL COURSE NOTES/UPDATES:   Initial Impression / Assessment and Plan / Urgent Care Course:  Pertinent labs & imaging results that were available during my care of the patient were personally reviewed by me and considered in my medical decision making (see lab/imaging section of note  for values and interpretations).  Kendra Lane is a 85 y.o. female who presents to [facility] today with complaints of Back Pain  Patient is well appearing overall in clinic today. She does not appear to be in any acute distress. Presenting symptoms (see HPI) and exam as documented above. Atraumatic pain to RIGHT thoracic back present x 4 days. In the absence of injury, diagnotic imaging is felt to be of no benefit at this time. Suspect that this is arthritic in nature. Discussed conservative management. Patient has tried APAP, IBU, and Baclofen with little to no improvement. Most recent labs reviewed to assess renal function. Labs done on 06/12/2019 revealed a BUN of 11 and creatinine of 0.69 mg/dL; eGFR 94 mL/min. Will pursue treatment as follows:   Discontinue use of Baclofen.    Start meloxicam 15 mg daily.    May continue APAP and Solonpas patches as needed to supplement for  uncontrolled pain.   Will send in short course of Ultracet 37.5/325 mg for patient to use on a PRN basis. Discussed safety considerations. She was advised to exercise caution with this medication as it could cause somnolence. Educated on 3 gram/day APAP from all sources limit.    Educated on complimentary modalities to help with her pain.   Patient encouraged to remain active and stretch as tolerated.  .   Recommended moist heat application TID-QID for 15-20 minutes at a time.   Regarding her elevated blood pressure. Patient found to be hypertensive in the clinic today. Bood pressure re-measured after allowing patient to sit quietly in exam room. Repeat reading documented at (!) 174/91. She reports that she is compliant with her prescribed antihypertensive regimen. She attributes her elevated blood pressure to pain and associated anxiety. Patient denies associated CP, SOB, palpitations, weakness, headaches, and dizziness. She is scheduled to see her PCP on Monday (12/10/2019) and wishes to defer  management of her blood pressure to Dr. Army Melia. This is reasonable with the caveat being that development of any of the aforementioned symptoms will necessitate immediate evaluation and intervention in the emergency department. Patient verbalizes understanding and consent.   I have reviewed the follow up and strict return precautions for any new or worsening symptoms. Patient is aware of symptoms that would be deemed urgent/emergent, and would thus require further evaluation either here or in the emergency department. At the time of discharge, she verbalized understanding and consent with the discharge plan as it was reviewed with her. All questions were fielded by provider and/or clinic staff prior to patient discharge.    Final Clinical Impressions / Urgent Care Diagnoses:   Final diagnoses:  Acute right-sided thoracic back pain  Elevated blood pressure reading with diagnosis of hypertension  Anxiety about health    New Prescriptions:  [place] Controlled Substance Registry consulted? Yes, I have consulted the [place] Controlled Substances Registry for this patient, and feel the risk/benefit ratio today is favorable for proceeding with this prescription for a controlled substance.  . Discussed use of controlled substance medication to treat her acute pain.  o Reviewed [place] STOP Act regulations  o Clinic does not refill controlled substances over the phone without face to face evaluation.  . Safety precautions reviewed.  o Medications should not be bitten, chewed, sold, or taken with alcohol.  o Avoid use while working, driving, or operating heavy machinery.  o Side effects associated with the use of this particular medication reviewed. - Patient understands that this medication can cause CNS depression, increase her risk of falls, and even lead to overdose that may result in death, if used outside of the parameters that she and I discussed.  With all of this in mind, she knowingly accepts the risks and  responsibilities associated with intended course of treatment, and elects to responsibly proceed as discussed.  Meds ordered this encounter  Medications  . meloxicam (MOBIC) 15 MG tablet    Sig: Take 1 tablet (15 mg total) by mouth daily.    Dispense:  30 tablet    Refill:  0  . traMADol-acetaminophen (ULTRACET) 37.5-325 MG tablet    Sig: Take 1 tablet by mouth every 8 (eight) hours as needed.    Dispense:  15 tablet    Refill:  0    Recommended Follow up Care:  Patient encouraged to follow up with the following provider within the specified time frame, or sooner as dictated by the severity of her symptoms.  As always, she was instructed that for any urgent/emergent care needs, she should seek care either here or in the emergency department for more immediate evaluation.  Follow-up Information    Glean Hess, MD In 1 week.   Specialty: Internal Medicine Why: General reassessment of symptoms if not improving Contact information: [address] [place] [address] [phone]         NOTE: This note was prepared using Dragon dictation software along with smaller phrase technology. Despite my best ability to proofread, there is the potential that transcriptional errors may still occur from this process, and are completely unintentional.     Karen Kitchens, NP 12/08/19 2245

## 2019-12-10 ENCOUNTER — Ambulatory Visit (INDEPENDENT_AMBULATORY_CARE_PROVIDER_SITE_OTHER): Payer: Medicare Other | Admitting: Internal Medicine

## 2019-12-10 ENCOUNTER — Encounter: Payer: Self-pay | Admitting: Internal Medicine

## 2019-12-10 ENCOUNTER — Other Ambulatory Visit: Payer: Self-pay

## 2019-12-10 VITALS — BP 132/74 | HR 94 | Temp 96.9°F | Ht 67.0 in | Wt 129.0 lb

## 2019-12-10 DIAGNOSIS — M546 Pain in thoracic spine: Secondary | ICD-10-CM | POA: Diagnosis not present

## 2019-12-10 DIAGNOSIS — D171 Benign lipomatous neoplasm of skin and subcutaneous tissue of trunk: Secondary | ICD-10-CM | POA: Diagnosis not present

## 2019-12-10 DIAGNOSIS — I10 Essential (primary) hypertension: Secondary | ICD-10-CM

## 2019-12-10 NOTE — Progress Notes (Signed)
Date:  12/10/2019   Name:  Kendra Lane   DOB:  [birth year]   MRN:  [id number]   Chief Complaint: Hypertension  Hypertension This is a chronic problem. The problem is controlled. Associated symptoms include anxiety. Pertinent negatives include no chest pain, headaches, palpitations or shortness of breath. Past treatments include calcium channel blockers and beta blockers. The current treatment provides significant improvement.  Rib/back pain -  Had pain in right mid back and went to UC.  BP was very high.  Using heat and tylenol.  Also got mobic and ultracet.  She is much better now.  She would like to be able to use the ultracet as needed.  Lab Results  Component Value Date   CREATININE 0.69 06/12/2019   BUN 11 06/12/2019   NA 143 06/12/2019   K 3.7 06/12/2019   CL 101 06/12/2019   CO2 24 06/12/2019   Lab Results  Component Value Date   CHOL 242 (H) 06/12/2019   HDL 123 06/12/2019   LDLCALC 105 (H) 06/12/2019   TRIG 86 06/12/2019   CHOLHDL 2.0 06/12/2019   Lab Results  Component Value Date   TSH 0.646 10/21/2019   Lab Results  Component Value Date   HGBA1C 5.7 (H) 06/12/2019   Lab Results  Component Value Date   WBC 5.2 05/04/2019   HGB 13.8 05/04/2019   HCT 40.5 05/04/2019   MCV 92 05/04/2019   PLT 325 05/04/2019   Lab Results  Component Value Date   ALT 12 05/04/2019   AST 16 05/04/2019   ALKPHOS 43 05/04/2019   BILITOT 0.3 05/04/2019     Review of Systems  Constitutional: Negative for chills, fatigue and fever.  Respiratory: Negative for cough, chest tightness and shortness of breath.   Cardiovascular: Negative for chest pain and palpitations.  Gastrointestinal: Negative for abdominal pain and blood in stool.  Musculoskeletal: Positive for back pain.  Neurological: Negative for dizziness, light-headedness and headaches.  Psychiatric/Behavioral: Negative for sleep disturbance. The patient is not nervous/anxious.     Patient Active Problem List    Diagnosis Date Noted  . Osteoporosis 06/15/2018  . Arthritis of shoulder 11/30/2017  . Bigeminy 04/07/2016  . Benign esophageal stricture 01/21/2015  . Essential (primary) hypertension 01/21/2015  . Hammer toe 01/21/2015  . Fatty tumor 01/21/2015    No Known Allergies  Past Surgical History:  Procedure Laterality Date  . ABDOMINAL HYSTERECTOMY    . ESOPHAGOGASTRODUODENOSCOPY (EGD) WITH ESOPHAGEAL DILATION  10/2014   Dr. Allen Norris  . TOTAL VAGINAL HYSTERECTOMY  1993    Social History   Tobacco Use  . Smoking status: Never Smoker  . Smokeless tobacco: Never Used  . Tobacco comment: smoking cessation materials not required  Substance Use Topics  . Alcohol use: Yes    Alcohol/week: 2.0 standard drinks    Types: 2 Cans of beer per week  . Drug use: No     Medication list has been reviewed and updated.  Current Meds  Medication Sig  . amLODipine (NORVASC) 10 MG tablet TAKE 1 TABLET BY MOUTH DAILY  . aspirin 81 MG tablet Take 81 mg by mouth daily.  . baclofen 5 MG TABS Take 5 mg by mouth 2 (two) times daily as needed for muscle spasms.  . calcium-vitamin D (OSCAL WITH D) 500-200 MG-UNIT tablet Take 1 tablet by mouth. 600 mg daily  . hydrOXYzine (ATARAX/VISTARIL) 25 MG tablet Take 1 tablet (25 mg total) by mouth every 8 (eight) hours as  needed for anxiety.  . meloxicam (MOBIC) 15 MG tablet Take 1 tablet (15 mg total) by mouth daily.  . metoprolol succinate (TOPROL-XL) 25 MG 24 hr tablet TAKE 2 TABLETS(50 MG) BY MOUTH DAILY  . Multiple Vitamins-Minerals (CENTRUM SILVER 50+WOMEN PO) Take 1 tablet by mouth daily.  Marland Kitchen omeprazole (PRILOSEC) 20 MG capsule TAKE 1 CAPSULE(20 MG) BY MOUTH DAILY  . traMADol-acetaminophen (ULTRACET) 37.5-325 MG tablet Take 1 tablet by mouth every 8 (eight) hours as needed.    PHQ 2/9 Scores 12/10/2019 06/12/2019 06/04/2019 05/04/2019  PHQ - 2 Score 0 2 2 0  PHQ- 9 Score 0 2 6 -    BP Readings from Last 3 Encounters:  12/10/19 132/74  12/07/19 (!) 174/91   11/22/19 (!) 170/87    Physical Exam Vitals and nursing note reviewed.  Constitutional:      General: She is not in acute distress.    Appearance: She is well-developed.  HENT:     Head: Normocephalic and atraumatic.  Cardiovascular:     Rate and Rhythm: Normal rate and regular rhythm.     Pulses: Normal pulses.     Heart sounds: No murmur.  Pulmonary:     Effort: Pulmonary effort is normal. No respiratory distress.  Musculoskeletal:        General: Normal range of motion.     Thoracic back: Normal. No spasms or bony tenderness. Normal range of motion.     Lumbar back: Normal.     Right lower leg: No edema.     Left lower leg: No edema.  Skin:    General: Skin is warm and dry.     Findings: No rash.          Comments: Soft mobile lipomas  Neurological:     Mental Status: She is alert and oriented to person, place, and time.  Psychiatric:        Behavior: Behavior normal.        Thought Content: Thought content normal.     Wt Readings from Last 3 Encounters:  12/10/19 129 lb (58.5 kg)  12/07/19 132 lb (59.9 kg)  11/22/19 132 lb (59.9 kg)    BP 132/74   Pulse 94   Temp (!) 96.9 F (36.1 C) (Temporal)   Ht 5\' 7"  (1.702 m)   Wt 129 lb (58.5 kg)   SpO2 98%   BMI 20.20 kg/m   Assessment and Plan: 1. Essential (primary) hypertension Clinically stable exam with well controlled BP on metoprolol and amlodipine. Tolerating medications without side effects at this time. Elevated readings occurred in the setting of acute back pain. Encourage pt to monitor her BP at home when she is in a stable state without pain Pt to continue current regimen and low sodium diet; benefits of regular exercise as able discussed.  2. Right-sided thoracic back pain, unspecified chronicity Almost certainly MSK. She can continue to use heat, ultra-set as needed Cautioned to avoid regular doses of Mobic  3. Lipoma of skin and subcutaneous tissue of trunk Benign   Partially dictated  using Editor, commissioning. Any errors are unintentional.  Halina Maidens, MD [facility]  12/10/2019

## 2019-12-10 NOTE — Patient Instructions (Signed)
Baclofen - for muscle spasm to use AS NEEDED  Meloxicam - use a needed for inflammation - try to limit taking this one  Tramadol/APAP - this is for pain and can be used as needed

## 2019-12-14 DIAGNOSIS — H25813 Combined forms of age-related cataract, bilateral: Secondary | ICD-10-CM | POA: Diagnosis not present

## 2020-01-30 ENCOUNTER — Encounter: Payer: Self-pay | Admitting: Emergency Medicine

## 2020-01-30 ENCOUNTER — Ambulatory Visit
Admission: EM | Admit: 2020-01-30 | Discharge: 2020-01-30 | Disposition: A | Discharge status: Home / Self Care | Payer: Medicare Other | Attending: Family Medicine | Admitting: Family Medicine

## 2020-01-30 ENCOUNTER — Other Ambulatory Visit: Payer: Self-pay

## 2020-01-30 DIAGNOSIS — M25512 Pain in left shoulder: Secondary | ICD-10-CM

## 2020-01-30 DIAGNOSIS — M25511 Pain in right shoulder: Secondary | ICD-10-CM

## 2020-01-30 MED ORDER — TRAMADOL HCL 50 MG PO TABS: 50.0000 mg | ORAL_TABLET | Freq: Every evening | ORAL | 0 refills | Status: DC | PRN

## 2020-01-30 MED ORDER — MELOXICAM 7.5 MG PO TABS: 7.5000 mg | ORAL_TABLET | Freq: Every day | ORAL | 0 refills | Status: DC

## 2020-01-30 NOTE — ED Provider Notes (Signed)
[facility] ____________________________________________  Time seen: Approximately 11:41 AM  I have reviewed the triage vital signs and the nursing notes.   HISTORY  Chief Complaint Shoulder Pain (bilateral)   HPI Kendra Lane is a 85 y.o. female presenting for evaluation of bilateral shoulder pain.  Reports this is been ongoing for the last 1 to 2 weeks.  States pain is not constant, it comes and goes.  Pain is worse at night when she is laying down and getting comfortable.  Does also feel the pain during the day while she is active and doing work around the house and moving her arms with the warmer weather.  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  Has had recurrent issues similar to this in shoulders in the past and as well as in her back.  Denies any fall, direct injury or abrupt onset.  Denies any pain radiation, paresthesias, loss of range of motion, chest pain, shortness of breath or fevers.  No skin changes.  Has taken Tylenol and ibuprofen intermittently which helps some as well as topical arthritis creams which helps some.  States otherwise feels well.  Denies renal insufficiency.  Glean Hess, MD: PCP  Past Medical History:  Diagnosis Date  . GERD (gastroesophageal reflux disease)   . Hypertension     Patient Active Problem List   Diagnosis Date Noted  . Osteoporosis 06/15/2018  . Arthritis of shoulder 11/30/2017  . Bigeminy 04/07/2016  . Benign esophageal stricture 01/21/2015  . Essential (primary) hypertension 01/21/2015  . Hammer toe 01/21/2015  . Lipoma of skin and subcutaneous tissue of trunk 01/21/2015    Past Surgical History:  Procedure Laterality Date  . ABDOMINAL HYSTERECTOMY    . ESOPHAGOGASTRODUODENOSCOPY (EGD) WITH ESOPHAGEAL DILATION  10/2014   Dr. Allen Norris  . TOTAL VAGINAL HYSTERECTOMY  1993     No current facility-administered  medications for this encounter.  Current Outpatient Medications:  .  amLODipine (NORVASC) 10 MG tablet, TAKE 1 TABLET BY MOUTH DAILY, Disp: 90 tablet, Rfl: 3 .  aspirin 81 MG tablet, Take 81 mg by mouth daily., Disp: , Rfl:  .  calcium-vitamin D (OSCAL WITH D) 500-200 MG-UNIT tablet, Take 1 tablet by mouth. 600 mg daily, Disp: , Rfl:  .  hydrOXYzine (ATARAX/VISTARIL) 25 MG tablet, Take 1 tablet (25 mg total) by mouth every 8 (eight) hours as needed for anxiety., Disp: 20 tablet, Rfl: 0 .  metoprolol succinate (TOPROL-XL) 25 MG 24 hr tablet, TAKE 2 TABLETS(50 MG) BY MOUTH DAILY, Disp: 90 tablet, Rfl: 3 .  Multiple Vitamins-Minerals (CENTRUM SILVER 50+WOMEN PO), Take 1 tablet by mouth daily., Disp: , Rfl:  .  omeprazole (PRILOSEC) 20 MG capsule, TAKE 1 CAPSULE(20 MG) BY MOUTH DAILY, Disp: 90 capsule, Rfl: 3 .  meloxicam (MOBIC) 7.5 MG tablet, Take 1 tablet (7.5 mg total) by mouth daily., Disp: 10 tablet, Rfl: 0 .  traMADol (ULTRAM) 50 MG tablet, Take 1 tablet (50 mg total) by mouth at bedtime as needed for severe pain. Take 0.5-1 tablet as needed for pain at night, Disp: 5 tablet, Rfl: 0  Allergies Patient has no known allergies.  Family History  Problem Relation Age of Onset  . Hypertension Mother   . Breast cancer Daughter 65    Social History Social History   Tobacco Use  . Smoking status: Never Smoker  . Smokeless tobacco: Never Used  .  Tobacco comment: smoking cessation materials not required  Substance Use Topics  . Alcohol use: Not Currently    Alcohol/week: 2.0 standard drinks    Types: 2 Cans of beer per week    Comment: last use 08/2019  . Drug use: No    Review of Systems Constitutional: No fever ENT: No sore throat. Cardiovascular: Denies chest pain. Respiratory: Denies shortness of breath. Gastrointestinal: No abdominal pain.  No nausea, no vomiting.  No diarrhea.  Musculoskeletal: Negative for back pain. Skin: Negative for rash. Neurological: Negative for  focal weakness or numbness.   ____________________________________________   PHYSICAL EXAM:  VITAL SIGNS: ED Triage Vitals  Enc Vitals Group     BP 01/30/20 1115 (!) 169/82     Pulse Rate 01/30/20 1115 95     Resp 01/30/20 1115 18     Temp 01/30/20 1115 98 F (36.7 C)     Temp Source 01/30/20 1115 Oral     SpO2 01/30/20 1115 100 %     Weight 01/30/20 1115 131 lb (59.4 kg)     Height 01/30/20 1115 5\' 7"  (1.702 m)     Head Circumference --      Peak Flow --      Pain Score 01/30/20 1114 7     Pain Loc --      Pain Edu? --      Excl. in [place]? --     Constitutional: Alert and oriented. Well appearing and in no acute distress. Eyes: Conjunctivae are normal.  ENT      Head: Normocephalic and atraumatic. Cardiovascular: Normal rate, regular rhythm. Grossly normal heart sounds.  Good peripheral circulation. Respiratory: Normal respiratory effort without tachypnea nor retractions. Breath sounds are clear and equal bilaterally. No wheezes, rales, rhonchi. Musculoskeletal:  No midline cervical, thoracic or lumbar tenderness to palpation. Bilateral distal radial pulses equal and easily palpated. Bilateral shoulders minimal to mild diffuse tenderness to palpation, no point bony tenderness, mild pain with active range of motion in exam room but full range of motion present, no weakness to shoulders noted, no point bony tenderness, no swelling, no ecchymosis, right upper extremity is otherwise nontender, no paresthesias to upper extremities.  Bilateral hand grip strong and equal. Neurologic:  Normal speech and language. No gross focal neurologic deficits are appreciated. Speech is normal. No gait instability.  Skin:  Skin is warm, dry and intact. No rash noted. Psychiatric: Mood and affect are normal. Speech and behavior are normal. Patient exhibits appropriate insight and judgment   ___________________________________________   LABS (all labs ordered are listed, but only abnormal results  are displayed)    PROCEDURES Procedures     INITIAL IMPRESSION / ASSESSMENT AND PLAN / ED COURSE  Pertinent labs & imaging results that were available during my care of the patient were reviewed by me and considered in my medical decision making (see chart for details).  Very well-appearing patient.  No acute distress.  Bilateral shoulder pain present for 1 to 2 weeks in which she reports she has been doing more yard work.  No fall or direct injury.  No point bony tenderness.  Pain reproducible with active movement and palpation.  Suspect arthritic pain.  Will treat with Mobic and tramadol 0.5 to 1 tablet as needed at night for pain.  Discussed can continue arthritic cream.  Follow-up with orthopedic or primary for continued complaints.Discussed indication, risks and benefits of medications with patient.   Discussed follow up with Primary care physician this week. Discussed  follow up and return parameters including no resolution or any worsening concerns. Patient verbalized understanding and agreed to plan.   [place] controlled substance database reviewed, no issues.  ____________________________________________   FINAL CLINICAL IMPRESSION(S) / ED DIAGNOSES  Final diagnoses:  Acute pain of both shoulders     ED Discharge Orders         Ordered    traMADol (ULTRAM) 50 MG tablet  At bedtime PRN     01/30/20 1135    meloxicam (MOBIC) 7.5 MG tablet  Daily     01/30/20 1135           Note: This dictation was prepared with Dragon dictation along with smaller phrase technology. Any transcriptional errors that result from this process are unintentional.         Marylene Land, NP 01/30/20 1151

## 2020-01-30 NOTE — ED Triage Notes (Addendum)
Patient in today c/o bilateral shoulder pain L>R x 1 week. Patient states the pain is worse at night when she is trying to sleep. Patient denies any injury. Patient has tried OTC Tylenol and Ibuprofen without relief.

## 2020-01-30 NOTE — Discharge Instructions (Signed)
Take medication as prescribed.  You can continue taking Tylenol as needed and applying the topical cream as needed.  Rest. Drink plenty of fluids.   Follow-up with primary care or orthopedic exam for continued pain.  Return to Urgent care for new or worsening concerns.

## 2020-04-02 ENCOUNTER — Other Ambulatory Visit: Payer: Self-pay

## 2020-04-02 ENCOUNTER — Ambulatory Visit (INDEPENDENT_AMBULATORY_CARE_PROVIDER_SITE_OTHER): Payer: Medicare Other | Admitting: Internal Medicine

## 2020-04-02 ENCOUNTER — Encounter: Payer: Self-pay | Admitting: Internal Medicine

## 2020-04-02 ENCOUNTER — Ambulatory Visit
Admission: RE | Admit: 2020-04-02 | Discharge: 2020-04-02 | Disposition: A | Discharge status: Home / Self Care | Payer: Medicare Other | Source: Ambulatory Visit | Attending: Internal Medicine | Admitting: Internal Medicine

## 2020-04-02 ENCOUNTER — Ambulatory Visit
Admission: RE | Admit: 2020-04-02 | Discharge: 2020-04-02 | Disposition: A | Discharge status: Home / Self Care | Payer: Medicare Other | Attending: Internal Medicine | Admitting: Internal Medicine

## 2020-04-02 VITALS — BP 142/62 | HR 81 | Temp 98.1°F | Ht 67.0 in | Wt 126.0 lb

## 2020-04-02 DIAGNOSIS — M542 Cervicalgia: Secondary | ICD-10-CM

## 2020-04-02 DIAGNOSIS — M19019 Primary osteoarthritis, unspecified shoulder: Secondary | ICD-10-CM | POA: Diagnosis not present

## 2020-04-02 DIAGNOSIS — I700 Atherosclerosis of aorta: Secondary | ICD-10-CM | POA: Insufficient documentation

## 2020-04-02 DIAGNOSIS — M47812 Spondylosis without myelopathy or radiculopathy, cervical region: Secondary | ICD-10-CM | POA: Diagnosis not present

## 2020-04-02 DIAGNOSIS — M25512 Pain in left shoulder: Secondary | ICD-10-CM | POA: Diagnosis not present

## 2020-04-02 MED ORDER — BACLOFEN 10 MG PO TABS: 10.0000 mg | ORAL_TABLET | Freq: Every day | ORAL | 0 refills | Status: DC

## 2020-04-02 NOTE — Progress Notes (Signed)
Date:  04/02/2020   Name:  Kendra Lane   DOB:  05/18/1935   MRN:  [id number]   Chief Complaint: Neck Pain (X2-3 months, both shoulders, hurts to raise arms sometimes, pain shooting from left side back on head to neck to shoulder ) and Shoulder Pain  Neck Pain  This is a chronic problem. The problem has been gradually worsening. The pain is associated with nothing. The pain is present in the occipital region, right side and left side. The quality of the pain is described as aching and cramping. The pain is mild. The symptoms are aggravated by twisting. Pertinent negatives include no chest pain, fever, headaches, pain with swallowing, tingling, trouble swallowing or weakness. She has tried acetaminophen and heat for the symptoms. The treatment provided mild relief.  Shoulder Pain  The pain is present in the right shoulder and left shoulder. This is a chronic problem. There has been no history of extremity trauma. The problem occurs every several days. The problem has been gradually worsening. The quality of the pain is described as aching (decreased ROM  L>R). Pertinent negatives include no fever or tingling. She has tried acetaminophen, heat and OTC ointments for the symptoms. The treatment provided mild relief.    Lab Results  Component Value Date   CREATININE 0.69 06/12/2019   BUN 11 06/12/2019   NA 143 06/12/2019   K 3.7 06/12/2019   CL 101 06/12/2019   CO2 24 06/12/2019   Lab Results  Component Value Date   CHOL 242 (H) 06/12/2019   HDL 123 06/12/2019   LDLCALC 105 (H) 06/12/2019   TRIG 86 06/12/2019   CHOLHDL 2.0 06/12/2019   Lab Results  Component Value Date   TSH 0.646 10/21/2019   Lab Results  Component Value Date   HGBA1C 5.7 (H) 06/12/2019   Lab Results  Component Value Date   WBC 5.2 05/04/2019   HGB 13.8 05/04/2019   HCT 40.5 05/04/2019   MCV 92 05/04/2019   PLT 325 05/04/2019   Lab Results  Component Value Date   ALT 12 05/04/2019   AST 16  05/04/2019   ALKPHOS 43 05/04/2019   BILITOT 0.3 05/04/2019     Review of Systems  Constitutional: Negative for chills, fatigue and fever.  HENT: Negative for trouble swallowing.   Eyes: Negative for visual disturbance.  Respiratory: Negative for shortness of breath.   Cardiovascular: Negative for chest pain and palpitations.  Musculoskeletal: Positive for arthralgias and neck pain. Negative for joint swelling and neck stiffness.  Neurological: Negative for dizziness, tingling, weakness and headaches.    Patient Active Problem List   Diagnosis Date Noted  . Aortic atherosclerosis ([place]) 04/02/2020  . Osteoporosis 06/15/2018  . Arthritis of shoulder 11/30/2017  . Bigeminy 04/07/2016  . Benign esophageal stricture 01/21/2015  . Essential (primary) hypertension 01/21/2015  . Hammer toe 01/21/2015  . Lipoma of skin and subcutaneous tissue of trunk 01/21/2015    No Known Allergies  Past Surgical History:  Procedure Laterality Date  . ABDOMINAL HYSTERECTOMY    . ESOPHAGOGASTRODUODENOSCOPY (EGD) WITH ESOPHAGEAL DILATION  10/2014   Dr. Allen Norris  . TOTAL VAGINAL HYSTERECTOMY  1993    Social History   Tobacco Use  . Smoking status: Never Smoker  . Smokeless tobacco: Never Used  . Tobacco comment: smoking cessation materials not required  Vaping Use  . Vaping Use: Never used  Substance Use Topics  . Alcohol use: Not Currently    Alcohol/week: 2.0 standard  drinks    Types: 2 Cans of beer per week    Comment: last use 08/2019  . Drug use: No     Medication list has been reviewed and updated.  Current Meds  Medication Sig  . amLODipine (NORVASC) 10 MG tablet TAKE 1 TABLET BY MOUTH DAILY  . aspirin 81 MG tablet Take 81 mg by mouth daily.  . calcium-vitamin D (OSCAL WITH D) 500-200 MG-UNIT tablet Take 1 tablet by mouth. 600 mg daily  . metoprolol succinate (TOPROL-XL) 25 MG 24 hr tablet TAKE 2 TABLETS(50 MG) BY MOUTH DAILY  . Multiple Vitamins-Minerals (CENTRUM SILVER  50+WOMEN PO) Take 1 tablet by mouth daily.  Marland Kitchen omeprazole (PRILOSEC) 20 MG capsule TAKE 1 CAPSULE(20 MG) BY MOUTH DAILY    PHQ 2/9 Scores 04/02/2020 12/10/2019 06/12/2019 06/04/2019  PHQ - 2 Score 0 0 2 2  PHQ- 9 Score 2 0 2 6    GAD 7 : Generalized Anxiety Score 04/02/2020  Nervous, Anxious, on Edge 0  Control/stop worrying 0  Worry too much - different things 0  Trouble relaxing 0  Restless 0  Easily annoyed or irritable 0  Afraid - awful might happen 0  Total GAD 7 Score 0  Anxiety Difficulty Not difficult at all    BP Readings from Last 3 Encounters:  04/02/20 (!) 142/62  01/30/20 (!) 169/82  12/10/19 132/74    Physical Exam Vitals and nursing note reviewed.  Constitutional:      General: She is not in acute distress.    Appearance: She is well-developed.  HENT:     Head: Normocephalic and atraumatic.  Cardiovascular:     Rate and Rhythm: Normal rate and regular rhythm.  Pulmonary:     Effort: Pulmonary effort is normal. No respiratory distress.     Breath sounds: No wheezing or rhonchi.  Musculoskeletal:     Right shoulder: Bony tenderness present. Decreased range of motion.     Left shoulder: Bony tenderness present. Decreased range of motion.     Cervical back: Tenderness present. No torticollis or bony tenderness. Pain with movement (with rotation to the left) present. Decreased range of motion.  Skin:    General: Skin is warm and dry.     Findings: No rash.  Neurological:     Mental Status: She is alert and oriented to person, place, and time.     Sensory: Sensation is intact.     Motor: Motor function is intact.  Psychiatric:        Behavior: Behavior normal.        Thought Content: Thought content normal.     Wt Readings from Last 3 Encounters:  04/02/20 126 lb (57.2 kg)  01/30/20 131 lb (59.4 kg)  12/10/19 129 lb (58.5 kg)    BP (!) 142/62   Pulse 81   Temp 98.1 F (36.7 C) (Oral)   Ht 5\' 7"  (1.702 m)   Wt 126 lb (57.2 kg)   SpO2 99%   BMI  19.73 kg/m   Assessment and Plan: 1. Bilateral posterior neck pain Most likely OA with muscle spasm Continue Tylenol but take bid rather than PRN, topical rubs and heat as needed Add baclofen at HS - DG Cervical Spine Complete; Future - baclofen (LIORESAL) 10 MG tablet; Take 1 tablet (10 mg total) by mouth at bedtime.  Dispense: 30 each; Refill: 0  2. Aortic atherosclerosis ([place]) Noted on previous imaging. Continue aspirin.  3. Arthritis of shoulder Seen on previous imaging -  mild degen change in right AC joint and mild narrowing of left AC joint. Continue tyelnol and topical rubs  Partially dictated using Editor, commissioning. Any errors are unintentional.  Halina Maidens, MD [facility]  04/02/2020

## 2020-04-02 NOTE — Patient Instructions (Signed)
Continue Tylenol 500 mg 2 tabs twice a day.  Use heat and topical rubs as desired.  Add Baclofen (muscle relaxer) every evening.

## 2020-04-04 ENCOUNTER — Telehealth: Payer: Self-pay | Admitting: Internal Medicine

## 2020-04-04 NOTE — Telephone Encounter (Signed)
Please Advise.  KP

## 2020-04-04 NOTE — Telephone Encounter (Signed)
Copied from [place] [id number]. Topic: Quick Communication - Other Results (Clinic Use ONLY) >> Apr 04, 2020  8:21 AM Scherrie Gerlach wrote: Pt returning call, most likely for xray results

## 2020-04-04 NOTE — Telephone Encounter (Signed)
Spoke to pt concerning mod deg changes and spasm. She will call back for follow up if not improved

## 2020-04-29 ENCOUNTER — Other Ambulatory Visit: Payer: Self-pay | Admitting: Internal Medicine

## 2020-04-29 DIAGNOSIS — M542 Cervicalgia: Secondary | ICD-10-CM

## 2020-04-29 NOTE — Telephone Encounter (Signed)
Please Advise. Last office visit 04/02/2020.

## 2020-04-29 NOTE — Telephone Encounter (Signed)
Requested medication (s) are due for refill today:  Yes  Requested medication (s) are on the active medication list:  Yes  Future visit scheduled:  Yes  Last Refill: 04/02/20; #30/ no refills  Requested Prescriptions  Pending Prescriptions Disp Refills   baclofen (LIORESAL) 10 MG tablet [Pharmacy Med Name: BACLOFEN 10MG  TABLETS] 30 tablet     Sig: TAKE 1 TABLET(10 MG) BY MOUTH AT BEDTIME      Not Delegated - Analgesics:  Muscle Relaxants Failed - 04/29/2020 10:17 AM      Failed - This refill cannot be delegated      Passed - Valid encounter within last 6 months    Recent Outpatient Visits           3 weeks ago Bilateral posterior neck pain   [facility] Glean Hess, MD   4 months ago Essential (primary) hypertension   [facility] Glean Hess, MD   10 months ago Annual physical exam   [facility] Glean Hess, MD   12 months ago Essential (primary) hypertension   [facility] Glean Hess, MD   1 year ago Essential (primary) hypertension   [facility] Glean Hess, MD       Future Appointments             In 1 month Army Melia, Jesse Sans, MD [facility], [facility]

## 2020-05-01 ENCOUNTER — Ambulatory Visit: Payer: Self-pay | Admitting: Internal Medicine

## 2020-05-01 NOTE — Telephone Encounter (Signed)
Pt. Reports she has neck pain since June. Has been to UC and her PCP. Pain comes and goes. Mild. Trying ice packs and heat with little relief. Appointment made.  MW 1  Kendra S. [facility] Female, 85 y.o., [birth year] MRN:  [id number] Phone:  [phone] (H) PCP:  Glean Hess, MD Primary Cvg:  [facility] 06/04/2020 at 10:00 AM Message from Sheran Luz sent at 05/01/2020 9:16 AM EDT  Summary: Requesting to speak with nurse regarding headache    Patient requesting to speak with RN regarding pain going from the top of her head to her neck.         Call History   Type Contact Phone  05/01/2020 09:21 AM EDT Phone ([address] [name], [name] (Self) [phone] (H)  User: Elliot Cousin, RN  No Answer/Busy -  "Call cannot be completed at this time." Will continue attempts.  05/01/2020 09:15 AM EDT Phone (Incoming) [name], [name] (Self) [phone] (H)  User: Sheran Luz  Encounter Report  Patient Encounter Report    Reason for Disposition  [1] MODERATE neck pain (e.g., interferes with normal activities AND [2] present > 3 days  Answer Assessment - Initial Assessment Questions 1. ONSET: "When did the pain begin?"      June 2. LOCATION: "Where does it hurt?"      Left side 3. PATTERN "Does the pain come and go, or has it been constant since it started?"      Comes and goes 4. SEVERITY: "How bad is the pain?"  (Scale 1-10; or mild, moderate, severe)   - NO PAIN (0): no pain or only slight stiffness    - MILD (1-3): doesn't interfere with normal activities    - MODERATE (4-7): interferes with normal activities or awakens from sleep    - SEVERE (8-10):  excruciating pain, unable to do any normal activities      Mild 5. RADIATION: "Does the pain go anywhere else, shoot into your arms?"     Shoulder 6. CORD SYMPTOMS: "Any weakness or numbness of the arms or legs?"     No 7. CAUSE:  "What do you think is causing the neck pain?"     No 8. NECK OVERUSE: "Any recent activities that involved turning or twisting the neck?"     No 9. OTHER SYMPTOMS: "Do you have any other symptoms?" (e.g., headache, fever, chest pain, difficulty breathing, neck swelling)     No 10. PREGNANCY: "Is there any chance you are pregnant?" "When was your last menstrual period?"       No  Protocols used: NECK PAIN OR STIFFNESS-A-AH

## 2020-05-05 ENCOUNTER — Encounter: Payer: Self-pay | Admitting: Internal Medicine

## 2020-05-05 ENCOUNTER — Ambulatory Visit (INDEPENDENT_AMBULATORY_CARE_PROVIDER_SITE_OTHER): Payer: Medicare Other | Admitting: Internal Medicine

## 2020-05-05 ENCOUNTER — Other Ambulatory Visit: Payer: Self-pay

## 2020-05-05 VITALS — BP 138/84 | HR 100 | Temp 98.6°F | Ht 67.0 in | Wt 126.0 lb

## 2020-05-05 DIAGNOSIS — M542 Cervicalgia: Secondary | ICD-10-CM | POA: Diagnosis not present

## 2020-05-05 MED ORDER — TRAMADOL-ACETAMINOPHEN 37.5-325 MG PO TABS: 1.0000 | ORAL_TABLET | Freq: Four times a day (QID) | ORAL | 0 refills | Status: DC | PRN

## 2020-05-05 NOTE — Patient Instructions (Signed)
[facility]  [address]  [place], [place] [address]  [phone]  Cherrie Gauze, [place]  [place], [place] [address]  [phone]  [phone] (Fax)   Take the Ultracet three times per day for day. Continue to take the Baclofen muscle relaxer three times a day. Continue to use heat or ice on your neck and shoulder.

## 2020-05-05 NOTE — Progress Notes (Signed)
Date:  05/05/2020   Name:  Kendra Lane   DOB:  1935-03-26   MRN:  [id number]   Chief Complaint: Neck Pain (X2 months, left side to back of neck, pain comes and goes,  top of her head hurts, doesnt hurt when moving head from side to side  or up and down )  Neck Pain  This is a new problem. The current episode started more than 1 month ago. The problem occurs constantly. The problem has been unchanged. The pain is associated with nothing. The pain is present in the midline. The pain is mild. Pertinent negatives include no chest pain, fever, headaches, numbness, pain with swallowing, paresis, tingling, trouble swallowing, visual change or weakness. She has tried heat, ice and muscle relaxants for the symptoms. The treatment provided mild relief.   C spine:  03/2020 IMPRESSION: 1.  No fracture or static subluxation of the cervical spine.  2. There is moderate disc space height loss and osteophytosis of C5-C6, with mild disc space height loss elsewhere, and focal, degenerative straightening of the normal cervical lordosis. There is no high-grade bony neural foraminal stenosis. Cervical disc and neural foraminal pathology may be further evaluated by MRI if indicated by neurologically localizing signs and symptoms.  Lab Results  Component Value Date   CREATININE 0.69 06/12/2019   BUN 11 06/12/2019   NA 143 06/12/2019   K 3.7 06/12/2019   CL 101 06/12/2019   CO2 24 06/12/2019   Lab Results  Component Value Date   CHOL 242 (H) 06/12/2019   HDL 123 06/12/2019   LDLCALC 105 (H) 06/12/2019   TRIG 86 06/12/2019   CHOLHDL 2.0 06/12/2019   Lab Results  Component Value Date   TSH 0.646 10/21/2019   Lab Results  Component Value Date   HGBA1C 5.7 (H) 06/12/2019   Lab Results  Component Value Date   WBC 5.2 05/04/2019   HGB 13.8 05/04/2019   HCT 40.5 05/04/2019   MCV 92 05/04/2019   PLT 325 05/04/2019   Lab Results  Component Value Date   ALT 12 05/04/2019   AST 16  05/04/2019   ALKPHOS 43 05/04/2019   BILITOT 0.3 05/04/2019     Review of Systems  Constitutional: Negative for fever.  HENT: Negative for trouble swallowing.   Respiratory: Negative for shortness of breath.   Cardiovascular: Negative for chest pain and palpitations.  Musculoskeletal: Positive for neck pain. Negative for arthralgias and gait problem.  Neurological: Negative for tingling, weakness, numbness and headaches.  Psychiatric/Behavioral: The patient is not nervous/anxious.     Patient Active Problem List   Diagnosis Date Noted  . Aortic atherosclerosis ([place]) 04/02/2020  . Osteoporosis 06/15/2018  . Arthritis of shoulder 11/30/2017  . Bigeminy 04/07/2016  . Benign esophageal stricture 01/21/2015  . Essential (primary) hypertension 01/21/2015  . Hammer toe 01/21/2015  . Lipoma of skin and subcutaneous tissue of trunk 01/21/2015    No Known Allergies  Past Surgical History:  Procedure Laterality Date  . ABDOMINAL HYSTERECTOMY    . ESOPHAGOGASTRODUODENOSCOPY (EGD) WITH ESOPHAGEAL DILATION  10/2014   Dr. Allen Norris  . TOTAL VAGINAL HYSTERECTOMY  1993    Social History   Tobacco Use  . Smoking status: Never Smoker  . Smokeless tobacco: Never Used  . Tobacco comment: smoking cessation materials not required  Vaping Use  . Vaping Use: Never used  Substance Use Topics  . Alcohol use: Not Currently    Alcohol/week: 2.0 standard drinks  Types: 2 Cans of beer per week    Comment: last use 08/2019  . Drug use: No     Medication list has been reviewed and updated.  Current Meds  Medication Sig  . amLODipine (NORVASC) 10 MG tablet TAKE 1 TABLET BY MOUTH DAILY  . aspirin 81 MG tablet Take 81 mg by mouth daily.  . baclofen (LIORESAL) 10 MG tablet TAKE 1 TABLET(10 MG) BY MOUTH AT BEDTIME  . calcium-vitamin D (OSCAL WITH D) 500-200 MG-UNIT tablet Take 1 tablet by mouth. 600 mg daily  . metoprolol succinate (TOPROL-XL) 25 MG 24 hr tablet TAKE 2 TABLETS(50 MG) BY MOUTH  DAILY  . Multiple Vitamins-Minerals (CENTRUM SILVER 50+WOMEN PO) Take 1 tablet by mouth daily.  Marland Kitchen omeprazole (PRILOSEC) 20 MG capsule TAKE 1 CAPSULE(20 MG) BY MOUTH DAILY    PHQ 2/9 Scores 04/02/2020 12/10/2019 06/12/2019 06/04/2019  PHQ - 2 Score 0 0 2 2  PHQ- 9 Score 2 0 2 6    GAD 7 : Generalized Anxiety Score 04/02/2020  Nervous, Anxious, on Edge 0  Control/stop worrying 0  Worry too much - different things 0  Trouble relaxing 0  Restless 0  Easily annoyed or irritable 0  Afraid - awful might happen 0  Total GAD 7 Score 0  Anxiety Difficulty Not difficult at all    BP Readings from Last 3 Encounters:  05/05/20 138/84  04/02/20 (!) 142/62  01/30/20 (!) 169/82    Physical Exam Vitals and nursing note reviewed.  Constitutional:      General: She is not in acute distress.    Appearance: She is well-developed.  HENT:     Head: Normocephalic and atraumatic.  Neck:     Vascular: No carotid bruit.  Cardiovascular:     Rate and Rhythm: Normal rate and regular rhythm.  Pulmonary:     Effort: Pulmonary effort is normal. No respiratory distress.     Breath sounds: No wheezing or rhonchi.  Musculoskeletal:        General: Normal range of motion.     Cervical back: Spasms (left shoulder and neck) and tenderness present.     Right lower leg: No edema.     Left lower leg: No edema.     Comments: No TA tenderness or TMJ click or pain with movement  Lymphadenopathy:     Cervical: No cervical adenopathy.  Skin:    General: Skin is warm and dry.     Findings: No rash.  Neurological:     Mental Status: She is alert and oriented to person, place, and time.  Psychiatric:        Behavior: Behavior normal.        Thought Content: Thought content normal.     Wt Readings from Last 3 Encounters:  05/05/20 126 lb (57.2 kg)  04/02/20 126 lb (57.2 kg)  01/30/20 131 lb (59.4 kg)    BP 138/84   Pulse 100   Temp 98.6 F (37 C) (Oral)   Ht 5\' 7"  (1.702 m)   Wt 126 lb (57.2 kg)    SpO2 98%   BMI 19.73 kg/m   Assessment and Plan: 1. Posterior neck pain Continue Baclofen and ice/heat Will add ultracet tid If no improvement, will refer to PT - traMADol-acetaminophen (ULTRACET) 37.5-325 MG tablet; Take 1 tablet by mouth every 6 (six) hours as needed.  Dispense: 30 tablet; Refill: 0   Partially dictated using Editor, commissioning. Any errors are unintentional.  Halina Maidens, MD [facility]  [employer] [facility]  05/05/2020

## 2020-05-20 DIAGNOSIS — H40053 Ocular hypertension, bilateral: Secondary | ICD-10-CM | POA: Diagnosis not present

## 2020-06-04 ENCOUNTER — Other Ambulatory Visit: Payer: Self-pay

## 2020-06-04 ENCOUNTER — Ambulatory Visit (INDEPENDENT_AMBULATORY_CARE_PROVIDER_SITE_OTHER): Payer: Medicare Other

## 2020-06-04 ENCOUNTER — Other Ambulatory Visit: Payer: Self-pay | Admitting: Internal Medicine

## 2020-06-04 VITALS — BP 168/88 | HR 90 | Temp 97.9°F | Resp 16 | Ht 67.0 in | Wt 126.0 lb

## 2020-06-04 DIAGNOSIS — Z0000 Encounter for general adult medical examination without abnormal findings: Secondary | ICD-10-CM | POA: Diagnosis not present

## 2020-06-04 DIAGNOSIS — K222 Esophageal obstruction: Secondary | ICD-10-CM

## 2020-06-04 NOTE — Patient Instructions (Signed)
Kendra Lane , Thank you for taking time to come for your Medicare Wellness Visit. I appreciate your ongoing commitment to your health goals. Please review the following plan we discussed and let me know if I can assist you in the future.   Screening recommendations/referrals: Colonoscopy: no longer required Mammogram: done 11/13/19 Bone Density: done 06/15/18 Recommended yearly ophthalmology/optometry visit for glaucoma screening and checkup Recommended yearly dental visit for hygiene and checkup  Vaccinations: Influenza vaccine: declined Pneumococcal vaccine: done 04/07/16 Tdap vaccine: due Shingles vaccine: Shingrix discussed. Please contact your pharmacy for coverage information.  Covid-19: done 02/25/20 & 03/24/20  Conditions/risks identified: Keep up the great work!  Next appointment: Follow up in one year for your annual wellness visit    Preventive Care 65 Years and Older, Female Preventive care refers to lifestyle choices and visits with your health care provider that can promote health and wellness. What does preventive care include?  A yearly physical exam. This is also called an annual well check.  Dental exams once or twice a year.  Routine eye exams. Ask your health care provider how often you should have your eyes checked.  Personal lifestyle choices, including:  Daily care of your teeth and gums.  Regular physical activity.  Eating a healthy diet.  Avoiding tobacco and drug use.  Limiting alcohol use.  Practicing safe sex.  Taking low-dose aspirin every day.  Taking vitamin and mineral supplements as recommended by your health care provider. What happens during an annual well check? The services and screenings done by your health care provider during your annual well check will depend on your age, overall health, lifestyle risk factors, and family history of disease. Counseling  Your health care provider may ask you questions about your:  Alcohol  use.  Tobacco use.  Drug use.  Emotional well-being.  Home and relationship well-being.  Sexual activity.  Eating habits.  History of falls.  Memory and ability to understand (cognition).  Work and work Statistician.  Reproductive health. Screening  You may have the following tests or measurements:  Height, weight, and BMI.  Blood pressure.  Lipid and cholesterol levels. These may be checked every 5 years, or more frequently if you are over 42 years old.  Skin check.  Lung cancer screening. You may have this screening every year starting at age 1 if you have a 30-pack-year history of smoking and currently smoke or have quit within the past 15 years.  Fecal occult blood test (FOBT) of the stool. You may have this test every year starting at age 76.  Flexible sigmoidoscopy or colonoscopy. You may have a sigmoidoscopy every 5 years or a colonoscopy every 10 years starting at age 1.  Hepatitis C blood test.  Hepatitis B blood test.  Sexually transmitted disease (STD) testing.  Diabetes screening. This is done by checking your blood sugar (glucose) after you have not eaten for a while (fasting). You may have this done every 1-3 years.  Bone density scan. This is done to screen for osteoporosis. You may have this done starting at age 65.  Mammogram. This may be done every 1-2 years. Talk to your health care provider about how often you should have regular mammograms. Talk with your health care provider about your test results, treatment options, and if necessary, the need for more tests. Vaccines  Your health care provider may recommend certain vaccines, such as:  Influenza vaccine. This is recommended every year.  Tetanus, diphtheria, and acellular pertussis (Tdap, Td)  vaccine. You may need a Td booster every 10 years.  Zoster vaccine. You may need this after age 15.  Pneumococcal 13-valent conjugate (PCV13) vaccine. One dose is recommended after age  65.  Pneumococcal polysaccharide (PPSV23) vaccine. One dose is recommended after age 48. Talk to your health care provider about which screenings and vaccines you need and how often you need them. This information is not intended to replace advice given to you by your health care provider. Make sure you discuss any questions you have with your health care provider. Document Released: 09/26/2015 Document Revised: 05/19/2016 Document Reviewed: 07/01/2015 Elsevier Interactive Patient Education  2017 [place] Prevention in the Home Falls can cause injuries. They can happen to people of all ages. There are many things you can do to make your home safe and to help prevent falls. What can I do on the outside of my home?  Regularly fix the edges of walkways and driveways and fix any cracks.  Remove anything that might make you trip as you walk through a door, such as a raised step or threshold.  Trim any bushes or trees on the path to your home.  Use bright outdoor lighting.  Clear any walking paths of anything that might make someone trip, such as rocks or tools.  Regularly check to see if handrails are loose or broken. Make sure that both sides of any steps have handrails.  Any raised decks and porches should have guardrails on the edges.  Have any leaves, snow, or ice cleared regularly.  Use sand or salt on walking paths during winter.  Clean up any spills in your garage right away. This includes oil or grease spills. What can I do in the bathroom?  Use night lights.  Install grab bars by the toilet and in the tub and shower. Do not use towel bars as grab bars.  Use non-skid mats or decals in the tub or shower.  If you need to sit down in the shower, use a plastic, non-slip stool.  Keep the floor dry. Clean up any water that spills on the floor as soon as it happens.  Remove soap buildup in the tub or shower regularly.  Attach bath mats securely with double-sided  non-slip rug tape.  Do not have throw rugs and other things on the floor that can make you trip. What can I do in the bedroom?  Use night lights.  Make sure that you have a light by your bed that is easy to reach.  Do not use any sheets or blankets that are too big for your bed. They should not hang down onto the floor.  Have a firm chair that has side arms. You can use this for support while you get dressed.  Do not have throw rugs and other things on the floor that can make you trip. What can I do in the kitchen?  Clean up any spills right away.  Avoid walking on wet floors.  Keep items that you use a lot in easy-to-reach places.  If you need to reach something above you, use a strong step stool that has a grab bar.  Keep electrical cords out of the way.  Do not use floor polish or wax that makes floors slippery. If you must use wax, use non-skid floor wax.  Do not have throw rugs and other things on the floor that can make you trip. What can I do with my stairs?  Do not leave  any items on the stairs.  Make sure that there are handrails on both sides of the stairs and use them. Fix handrails that are broken or loose. Make sure that handrails are as long as the stairways.  Check any carpeting to make sure that it is firmly attached to the stairs. Fix any carpet that is loose or worn.  Avoid having throw rugs at the top or bottom of the stairs. If you do have throw rugs, attach them to the floor with carpet tape.  Make sure that you have a light switch at the top of the stairs and the bottom of the stairs. If you do not have them, ask someone to add them for you. What else can I do to help prevent falls?  Wear shoes that:  Do not have high heels.  Have rubber bottoms.  Are comfortable and fit you well.  Are closed at the toe. Do not wear sandals.  If you use a stepladder:  Make sure that it is fully opened. Do not climb a closed stepladder.  Make sure that both  sides of the stepladder are locked into place.  Ask someone to hold it for you, if possible.  Clearly mark and make sure that you can see:  Any grab bars or handrails.  First and last steps.  Where the edge of each step is.  Use tools that help you move around (mobility aids) if they are needed. These include:  Canes.  Walkers.  Scooters.  Crutches.  Turn on the lights when you go into a dark area. Replace any light bulbs as soon as they burn out.  Set up your furniture so you have a clear path. Avoid moving your furniture around.  If any of your floors are uneven, fix them.  If there are any pets around you, be aware of where they are.  Review your medicines with your doctor. Some medicines can make you feel dizzy. This can increase your chance of falling. Ask your doctor what other things that you can do to help prevent falls. This information is not intended to replace advice given to you by your health care provider. Make sure you discuss any questions you have with your health care provider. Document Released: 06/26/2009 Document Revised: 02/05/2016 Document Reviewed: 10/04/2014 Elsevier Interactive Patient Education  2017 [employer].

## 2020-06-04 NOTE — Telephone Encounter (Signed)
Requested Prescriptions  Pending Prescriptions Disp Refills   omeprazole (PRILOSEC) 20 MG capsule [Pharmacy Med Name: OMEPRAZOLE 20MG  CAPSULES] 90 capsule 3    Sig: TAKE 1 CAPSULE(20 MG) BY MOUTH DAILY     Gastroenterology: Proton Pump Inhibitors Passed - 06/04/2020  6:24 AM      Passed - Valid encounter within last 12 months    Recent Outpatient Visits          1 month ago Posterior neck pain   [facility] Glean Hess, MD   2 months ago Bilateral posterior neck pain   [facility] Glean Hess, MD   5 months ago Essential (primary) hypertension   [facility] Glean Hess, MD   11 months ago Annual physical exam   [facility] Glean Hess, MD   1 year ago Essential (primary) hypertension   [facility] Glean Hess, MD      Future Appointments            In 1 week Army Melia Jesse Sans, MD [facility], [facility]

## 2020-06-04 NOTE — Progress Notes (Signed)
Subjective:   Kendra Lane is a 85 y.o. female who presents for Medicare Annual (Subsequent) preventive examination.  Review of Systems     Cardiac Risk Factors include: advanced age (>88men, >62 women);hypertension     Objective:    Today's Vitals   06/04/20 1013  BP: (!) 168/88  Pulse: 90  Resp: 16  Temp: 97.9 F (36.6 C)  TempSrc: Oral  SpO2: 97%  Weight: 126 lb (57.2 kg)  Height: 5\' 7"  (1.702 m)   Body mass index is 19.73 kg/m.  Advanced Directives 06/04/2020 11/22/2019 10/21/2019 06/04/2019 05/29/2018 01/03/2018 07/13/2017  Does Patient Have a Medical Advance Directive? Yes No No No Yes No No  Type of Paramedic of [place];Living will - - - [place];Living will - -  Copy of [place] in Chart? Yes - validated most recent copy scanned in chart (See row information) - - - No - copy requested - -  Would patient like information on creating a medical advance directive? - - - Yes (MAU/Ambulatory/Procedural Areas - Information given) - No - Patient declined -    Current Medications (verified) Outpatient Encounter Medications as of 06/04/2020  Medication Sig  . amLODipine (NORVASC) 10 MG tablet TAKE 1 TABLET BY MOUTH DAILY  . aspirin 81 MG tablet Take 81 mg by mouth daily.  . baclofen (LIORESAL) 10 MG tablet TAKE 1 TABLET(10 MG) BY MOUTH AT BEDTIME  . calcium-vitamin D (OSCAL WITH D) 500-200 MG-UNIT tablet Take 1 tablet by mouth. 600 mg daily  . metoprolol succinate (TOPROL-XL) 25 MG 24 hr tablet TAKE 2 TABLETS(50 MG) BY MOUTH DAILY  . Multiple Vitamins-Minerals (CENTRUM SILVER 50+WOMEN PO) Take 1 tablet by mouth daily.  Marland Kitchen omeprazole (PRILOSEC) 20 MG capsule TAKE 1 CAPSULE(20 MG) BY MOUTH DAILY  . traMADol-acetaminophen (ULTRACET) 37.5-325 MG tablet Take 1 tablet by mouth every 6 (six) hours as needed. (Patient not taking: Reported on 06/04/2020)   No facility-administered encounter medications on file as of  06/04/2020.    Allergies (verified) Patient has no known allergies.   History: Past Medical History:  Diagnosis Date  . GERD (gastroesophageal reflux disease)   . Hypertension    Past Surgical History:  Procedure Laterality Date  . ABDOMINAL HYSTERECTOMY    . ESOPHAGOGASTRODUODENOSCOPY (EGD) WITH ESOPHAGEAL DILATION  10/2014   Dr. Allen Norris  . TOTAL VAGINAL HYSTERECTOMY  1993   Family History  Problem Relation Age of Onset  . Hypertension Mother   . Breast cancer Daughter 29   Social History   Socioeconomic History  . Marital status: Widowed    Spouse name: Not on file  . Number of children: 8  . Years of education: Not on file  . Highest education level: 10th grade  Occupational History  . Occupation: Retired  Tobacco Use  . Smoking status: Never Smoker  . Smokeless tobacco: Never Used  . Tobacco comment: smoking cessation materials not required  Vaping Use  . Vaping Use: Never used  Substance and Sexual Activity  . Alcohol use: Not Currently    Alcohol/week: 2.0 standard drinks    Types: 2 Cans of beer per week    Comment: last use 08/2019  . Drug use: No  . Sexual activity: Not Currently  Other Topics Concern  . Not on file  Social History Narrative   Pt's son lives with her   Social Determinants of Health   Financial Resource Strain: Low Risk   . Difficulty of  Paying Living Expenses: Not hard at all  Food Insecurity: No Food Insecurity  . Worried About Charity fundraiser in the Last Year: Never true  . Ran Out of Food in the Last Year: Never true  Transportation Needs: No Transportation Needs  . Lack of Transportation (Medical): No  . Lack of Transportation (Non-Medical): No  Physical Activity: Sufficiently Active  . Days of Exercise per Week: 7 days  . Minutes of Exercise per Session: 30 min  Stress: No Stress Concern Present  . Feeling of Stress : Only a little  Social Connections: Socially Isolated  . Frequency of Communication with Friends and  Family: More than three times a week  . Frequency of Social Gatherings with Friends and Family: More than three times a week  . Attends Religious Services: Never  . Active Member of Clubs or Organizations: No  . Attends Archivist Meetings: Never  . Marital Status: Widowed    Tobacco Counseling Counseling given: Not Answered Comment: smoking cessation materials not required   Clinical Intake:  Pre-visit preparation completed: Yes  Pain : No/denies pain     BMI - recorded: 19.73 Nutritional Status: BMI of 19-24  Normal Nutritional Risks: None Diabetes: No  How often do you need to have someone help you when you read instructions, pamphlets, or other written materials from your doctor or pharmacy?: 1 - Never    Interpreter Needed?: No  Information entered by :: Clemetine Marker LPN   Activities of Daily Living In your present state of health, do you have any difficulty performing the following activities: 06/04/2020  Hearing? N  Comment declines hearing aids  Vision? N  Difficulty concentrating or making decisions? N  Walking or climbing stairs? N  Dressing or bathing? N  Doing errands, shopping? N  Preparing Food and eating ? N  Using the Toilet? N  In the past six months, have you accidently leaked urine? N  Do you have problems with loss of bowel control? N  Managing your Medications? N  Managing your Finances? N  Housekeeping or managing your Housekeeping? N  Some recent data might be hidden    Patient Care Team: Glean Hess, MD as PCP - General (Internal Medicine)  Indicate any recent Medical Services you may have received from other than [facility] providers in the past year (date may be approximate).     Assessment:   This is a routine wellness examination for Kendra Lane.  Hearing/Vision screen  Hearing Screening   125Hz  250Hz  500Hz  1000Hz  2000Hz  3000Hz  4000Hz  6000Hz  8000Hz   Right ear:           Left ear:           Comments: Pt denies hearing  difficulty  Vision Screening Comments: Annual vision screening at [facility] - scheduled for upcoming cataract surgery  Dietary issues and exercise activities discussed: Current Exercise Habits: Home exercise routine, Type of exercise: walking, Time (Minutes): 30, Frequency (Times/Week): 7, Weekly Exercise (Minutes/Week): 210, Intensity: Mild, Exercise limited by: orthopedic condition(s)  Goals    . DIET - INCREASE WATER INTAKE     Recommend to drink at least 6-8 8oz glasses of water per day.    . Prevent Falls     Remain active and continue to prevent falls.       Depression Screen PHQ 2/9 Scores 06/04/2020 04/02/2020 12/10/2019 06/12/2019 06/04/2019 05/04/2019 02/13/2019  PHQ - 2 Score 1 0 0 2 2 0 0  PHQ- 9 Score 1  2 0 2 6 - -    Fall Risk Fall Risk  06/04/2020 04/02/2020 12/10/2019 06/04/2019 05/04/2019  Falls in the past year? 0 0 0 0 0  Number falls in past yr: 0 - 0 0 0  Injury with Fall? 0 - 0 0 0  Risk for fall due to : No Fall Risks No Fall Risks No Fall Risks - -  Risk for fall due to: Comment - - - - -  Follow up Falls prevention discussed Falls evaluation completed Falls evaluation completed Falls prevention discussed -    Any stairs in or around the home? Yes  If so, are there any without handrails? No  Home free of loose throw rugs in walkways, pet beds, electrical cords, etc? Yes  Adequate lighting in your home to reduce risk of falls? Yes   ASSISTIVE DEVICES UTILIZED TO PREVENT FALLS:  Life alert? No  Use of a cane, walker or w/c? No  Grab bars in the bathroom? Yes  Shower chair or bench in shower? Yes  Elevated toilet seat or a handicapped toilet? Yes   TIMED UP AND GO:  Was the test performed? Yes .  Length of time to ambulate 10 feet: 5 sec.   Gait steady and fast without use of assistive device  Cognitive Function: MMSE - Mini Mental State Exam 04/07/2016  Orientation to time 5  Orientation to time comments 6CIT  Orientation to Place 5    Orientation to Place-comments 6CIT  Registration 3  Registration-comments 6CIT  Attention/ Calculation 5  Attention/Calculation-comments 6CIT  Recall 1  Recall-comments 6[facility]  Language- name 2 objects 2  Language- name 2 objects-comments 6CIT     6CIT Screen 06/04/2020 06/04/2019 05/29/2018 05/25/2017  What Year? 0 points 0 points 0 points 0 points  What month? 0 points 0 points 0 points 0 points  What time? 0 points 0 points 0 points 0 points  Count back from 20 0 points 0 points 0 points 0 points  Months in reverse 0 points 0 points 0 points 0 points  Repeat phrase 2 points 2 points 6 points 2 points  Total Score 2 2 6 2     Immunizations Immunization History  Administered Date(s) Administered  . Moderna SARS-COVID-2 Vaccination 02/25/2020, 03/24/2020  . Pneumococcal Conjugate-13 04/07/2016  . Pneumococcal Polysaccharide-23 06/26/2012    TDAP status: Due, Education has been provided regarding the importance of this vaccine. Advised may receive this vaccine at local pharmacy or Health Dept. Aware to provide a copy of the vaccination record if obtained from local pharmacy or Health Dept. Verbalized acceptance and understanding.   Flu Vaccine status: Declined, Education has been provided regarding the importance of this vaccine but patient still declined. Advised may receive this vaccine at local pharmacy or Health Dept. Aware to provide a copy of the vaccination record if obtained from local pharmacy or Health Dept. Verbalized acceptance and understanding.   Pneumococcal vaccine status: Up to date   Covid-19 vaccine status: Completed vaccines  Qualifies for Shingles Vaccine? Yes   Zostavax completed No   Shingrix Completed?: No.    Education has been provided regarding the importance of this vaccine. Patient has been advised to call insurance company to determine out of pocket expense if they have not yet received this vaccine. Advised may also receive vaccine at local pharmacy or  Health Dept. Verbalized acceptance and understanding.  Screening Tests Health Maintenance  Topic Date Due  . TETANUS/TDAP  12/09/2020 (Originally 08/25/1954)  . INFLUENZA  VACCINE  12/11/2020 (Originally 04/13/2020)  . MAMMOGRAM  11/12/2020  . DEXA SCAN  Completed  . COVID-19 Vaccine  Completed  . PNA vac Low Risk Adult  Completed    Health Maintenance  There are no preventive care reminders to display for this patient.  Colorectal cancer screening: No longer required.    Mammogram status: Completed 11/13/19. Repeat every year   Bone Density status: Completed 06/15/18. Results reflect: Bone density results: OSTEOPOROSIS. Repeat every 2 years. Pt plans to discuss repeat screening recommendations with Dr. Army Melia.   Lung Cancer Screening: (Low Dose CT Chest recommended if Age 74-80 years, 30 pack-year currently smoking OR have quit w/in 15years.) does not qualify.   Additional Screening:  Hepatitis C Screening: no longer required  Vision Screening: Recommended annual ophthalmology exams for early detection of glaucoma and other disorders of the eye. Is the patient up to date with their annual eye exam?  Yes  Who is the provider or what is the name of the office in which the patient attends annual eye exams? [place] Screening: Recommended annual dental exams for proper oral hygiene  Community Resource Referral / Chronic Care Management: CRR required this visit?  No   CCM required this visit?  No      Plan:     I have personally reviewed and noted the following in the patient's chart:   . Medical and social history . Use of alcohol, tobacco or illicit drugs  . Current medications and supplements . Functional ability and status . Nutritional status . Physical activity . Advanced directives . List of other physicians . Hospitalizations, surgeries, and ER visits in previous 12 months . Vitals . Screenings to include cognitive, depression, and  falls . Referrals and appointments  In addition, I have reviewed and discussed with patient certain preventive protocols, quality metrics, and best practice recommendations. A written personalized care plan for preventive services as well as general preventive health recommendations were provided to patient.     Clemetine Marker, LPN   09/23/5518   Nurse Notes: pt's blood pressure elevated at start and end of visit 168/88. Pt denies blurred vision, chest pain or severe headaches. Pt to follow up at [facility] next week with labs. Pt stated she was a little more stressed than usual this morning due to driving in the rain.

## 2020-06-12 ENCOUNTER — Encounter: Payer: Self-pay | Admitting: Internal Medicine

## 2020-06-12 ENCOUNTER — Ambulatory Visit (INDEPENDENT_AMBULATORY_CARE_PROVIDER_SITE_OTHER): Payer: Medicare Other | Admitting: Internal Medicine

## 2020-06-12 ENCOUNTER — Other Ambulatory Visit: Payer: Self-pay

## 2020-06-12 VITALS — BP 164/78 | HR 104 | Ht 67.0 in | Wt 126.0 lb

## 2020-06-12 DIAGNOSIS — Z1231 Encounter for screening mammogram for malignant neoplasm of breast: Secondary | ICD-10-CM | POA: Diagnosis not present

## 2020-06-12 DIAGNOSIS — Z0000 Encounter for general adult medical examination without abnormal findings: Secondary | ICD-10-CM

## 2020-06-12 DIAGNOSIS — M810 Age-related osteoporosis without current pathological fracture: Secondary | ICD-10-CM | POA: Insufficient documentation

## 2020-06-12 DIAGNOSIS — I700 Atherosclerosis of aorta: Secondary | ICD-10-CM | POA: Diagnosis not present

## 2020-06-12 DIAGNOSIS — K222 Esophageal obstruction: Secondary | ICD-10-CM | POA: Diagnosis not present

## 2020-06-12 DIAGNOSIS — I10 Essential (primary) hypertension: Secondary | ICD-10-CM

## 2020-06-12 LAB — POCT URINALYSIS DIPSTICK
Bilirubin, UA: NEGATIVE
Blood, UA: NEGATIVE
Glucose, UA: NEGATIVE
Ketones, UA: NEGATIVE
Leukocytes, UA: NEGATIVE
Nitrite, UA: NEGATIVE
Protein, UA: NEGATIVE
Spec Grav, UA: 1.015 (ref 1.010–1.025)
Urobilinogen, UA: 0.2 E.U./dL
pH, UA: 6.5 (ref 5.0–8.0)

## 2020-06-12 MED ORDER — OLMESARTAN MEDOXOMIL 20 MG PO TABS: 20.0000 mg | ORAL_TABLET | Freq: Every day | ORAL | 1 refills | Status: DC

## 2020-06-12 NOTE — Progress Notes (Signed)
Date:  06/12/2020   Name:  Kendra Lane   DOB:  December 24, 1934   MRN:  [id number]   Chief Complaint: Annual Exam (Breast Exam. No Pap. )  Kendra Lane is a 85 y.o. female who presents today for her Complete Annual Exam. She feels fairly well. She reports exercising rarely - some yard work and house work. She reports she is sleeping well. Breast complaints none.  Mammogram: 11/2019 DEXA: 06/2018 osteoporosis Colonoscopy: aged out  Immunization History  Administered Date(s) Administered  . Moderna SARS-COVID-2 Vaccination 02/25/2020, 03/24/2020  . Pneumococcal Conjugate-13 04/07/2016  . Pneumococcal Polysaccharide-23 06/26/2012    Hypertension This is a chronic problem. The problem is uncontrolled. Associated symptoms include headaches. Pertinent negatives include no chest pain, palpitations or shortness of breath. Associated agents: taking a dollar general cold/sinus pill. Past treatments include beta blockers and calcium channel blockers. The current treatment provides significant improvement.  Gastroesophageal Reflux She complains of dysphagia and heartburn. She reports no abdominal pain, no chest pain, no coughing or no wheezing. The problem occurs rarely. Pertinent negatives include no fatigue. She has tried a PPI for the symptoms. Past procedures include an EGD (and dilatation).    Lab Results  Component Value Date   CREATININE 0.69 06/12/2019   BUN 11 06/12/2019   NA 143 06/12/2019   K 3.7 06/12/2019   CL 101 06/12/2019   CO2 24 06/12/2019   Lab Results  Component Value Date   CHOL 242 (H) 06/12/2019   HDL 123 06/12/2019   LDLCALC 105 (H) 06/12/2019   TRIG 86 06/12/2019   CHOLHDL 2.0 06/12/2019   Lab Results  Component Value Date   TSH 0.646 10/21/2019   Lab Results  Component Value Date   HGBA1C 5.7 (H) 06/12/2019   Lab Results  Component Value Date   WBC 5.2 05/04/2019   HGB 13.8 05/04/2019   HCT 40.5 05/04/2019   MCV 92 05/04/2019   PLT 325  05/04/2019   Lab Results  Component Value Date   ALT 12 05/04/2019   AST 16 05/04/2019   ALKPHOS 43 05/04/2019   BILITOT 0.3 05/04/2019     Review of Systems  Constitutional: Negative for chills, fatigue and fever.  HENT: Negative for congestion, hearing loss, tinnitus, trouble swallowing and voice change.   Eyes: Negative for visual disturbance.  Respiratory: Negative for cough, chest tightness, shortness of breath and wheezing.   Cardiovascular: Negative for chest pain, palpitations and leg swelling.  Gastrointestinal: Positive for dysphagia and heartburn. Negative for abdominal pain, constipation, diarrhea and vomiting.  Endocrine: Negative for polydipsia and polyuria.  Genitourinary: Negative for dysuria, frequency, genital sores, vaginal bleeding and vaginal discharge.  Musculoskeletal: Positive for arthralgias. Negative for gait problem and joint swelling.  Skin: Negative for color change and rash.  Neurological: Positive for headaches. Negative for dizziness, tremors and light-headedness.  Hematological: Negative for adenopathy. Does not bruise/bleed easily.  Psychiatric/Behavioral: Negative for dysphoric mood and sleep disturbance. The patient is not nervous/anxious.     Patient Active Problem List   Diagnosis Date Noted  . Age-related osteoporosis without current pathological fracture 06/12/2020  . Aortic atherosclerosis ([place]) 04/02/2020  . Arthritis of shoulder 11/30/2017  . Bigeminy 04/07/2016  . Benign esophageal stricture 01/21/2015  . Essential (primary) hypertension 01/21/2015  . Hammer toe 01/21/2015  . Lipoma of skin and subcutaneous tissue of trunk 01/21/2015    No Known Allergies  Past Surgical History:  Procedure Laterality Date  . ABDOMINAL HYSTERECTOMY    .  ESOPHAGOGASTRODUODENOSCOPY (EGD) WITH ESOPHAGEAL DILATION  10/2014   Dr. Allen Norris  . TOTAL VAGINAL HYSTERECTOMY  1993    Social History   Tobacco Use  . Smoking status: Never Smoker  .  Smokeless tobacco: Never Used  . Tobacco comment: smoking cessation materials not required  Vaping Use  . Vaping Use: Never used  Substance Use Topics  . Alcohol use: Not Currently    Alcohol/week: 2.0 standard drinks    Types: 2 Cans of beer per week    Comment: last use 08/2019  . Drug use: No     Medication list has been reviewed and updated.  Current Meds  Medication Sig  . amLODipine (NORVASC) 10 MG tablet TAKE 1 TABLET BY MOUTH DAILY  . aspirin 81 MG tablet Take 81 mg by mouth daily.  . baclofen (LIORESAL) 10 MG tablet TAKE 1 TABLET(10 MG) BY MOUTH AT BEDTIME  . calcium-vitamin D (OSCAL WITH D) 500-200 MG-UNIT tablet Take 1 tablet by mouth. 600 mg daily  . metoprolol succinate (TOPROL-XL) 25 MG 24 hr tablet TAKE 2 TABLETS(50 MG) BY MOUTH DAILY  . Multiple Vitamins-Minerals (CENTRUM SILVER 50+WOMEN PO) Take 1 tablet by mouth daily.  Marland Kitchen omeprazole (PRILOSEC) 20 MG capsule TAKE 1 CAPSULE(20 MG) BY MOUTH DAILY  . traMADol-acetaminophen (ULTRACET) 37.5-325 MG tablet Take 1 tablet by mouth every 6 (six) hours as needed.    PHQ 2/9 Scores 06/04/2020 04/02/2020 12/10/2019 06/12/2019  PHQ - 2 Score 1 0 0 2  PHQ- 9 Score 1 2 0 2    GAD 7 : Generalized Anxiety Score 04/02/2020  Nervous, Anxious, on Edge 0  Control/stop worrying 0  Worry too much - different things 0  Trouble relaxing 0  Restless 0  Easily annoyed or irritable 0  Afraid - awful might happen 0  Total GAD 7 Score 0  Anxiety Difficulty Not difficult at all    BP Readings from Last 3 Encounters:  06/12/20 (!) 164/78  06/04/20 (!) 168/88  05/05/20 138/84    Physical Exam Vitals and nursing note reviewed.  Constitutional:      General: She is not in acute distress.    Appearance: Normal appearance. She is well-developed.  HENT:     Head: Normocephalic and atraumatic.     Right Ear: Tympanic membrane and ear canal normal.     Left Ear: Tympanic membrane and ear canal normal.     Nose:     Right Sinus: No  maxillary sinus tenderness.     Left Sinus: No maxillary sinus tenderness.  Eyes:     General: No scleral icterus.       Right eye: No discharge.        Left eye: No discharge.     Conjunctiva/sclera: Conjunctivae normal.  Neck:     Thyroid: No thyromegaly.     Vascular: No carotid bruit.  Cardiovascular:     Rate and Rhythm: Normal rate and regular rhythm.     Pulses: Normal pulses.     Heart sounds: Normal heart sounds.  Pulmonary:     Effort: Pulmonary effort is normal. No respiratory distress.     Breath sounds: No wheezing.  Chest:     Breasts:        Right: No mass, nipple discharge, skin change or tenderness.        Left: No mass, nipple discharge, skin change or tenderness.  Abdominal:     General: Bowel sounds are normal.     Palpations: Abdomen is  soft.     Tenderness: There is no abdominal tenderness.  Musculoskeletal:        General: Normal range of motion.     Cervical back: Normal range of motion. No erythema.     Right lower leg: No edema.     Left lower leg: No edema.  Lymphadenopathy:     Cervical: No cervical adenopathy.  Skin:    General: Skin is warm and dry.     Capillary Refill: Capillary refill takes less than 2 seconds.     Findings: No rash.  Neurological:     General: No focal deficit present.     Mental Status: She is alert and oriented to person, place, and time.     Cranial Nerves: No cranial nerve deficit.     Sensory: No sensory deficit.     Motor: No weakness.     Coordination: Coordination normal.     Gait: Gait normal.     Deep Tendon Reflexes: Reflexes are normal and symmetric.  Psychiatric:        Attention and Perception: Attention normal.        Mood and Affect: Mood normal.     Wt Readings from Last 3 Encounters:  06/12/20 126 lb (57.2 kg)  06/04/20 126 lb (57.2 kg)  05/05/20 126 lb (57.2 kg)    BP (!) 164/78   Pulse (!) 104   Ht 5\' 7"  (1.702 m)   Wt 126 lb (57.2 kg)   SpO2 98%   BMI 19.73 kg/m   Assessment and  Plan: 1. Annual physical exam - POCT urinalysis dipstick  2. Essential (primary) hypertension Clinically stable exam but BP is not well controlled. Tolerating medications without side effects at this time. Will add Benicar 20 mg daily and recheck in one month. Pt to continue current regimen and low sodium diet; benefits of regular exercise as able discussed. - CBC with Differential/Platelet - Comprehensive metabolic panel - TSH - olmesartan (BENICAR) 20 MG tablet; Take 1 tablet (20 mg total) by mouth daily.  Dispense: 90 tablet; Refill: 1  3. Aortic atherosclerosis (HCC) Continue ASA 81 mg daily - Lipid panel  4. Benign esophageal stricture Symptoms well controlled on daily PPI No red flag signs such as weight loss, n/v, melena Will continue omeprazole. - CBC with Differential/Platelet  5. Age-related osteoporosis without current pathological fracture Continue vitamin D and calcium - VITAMIN D 25 Hydroxy (Vit-D Deficiency, Fractures)  6. Encounter for screening mammogram for breast cancer - MM 3D SCREEN BREAST BILATERAL; Future   Partially dictated using Editor, commissioning. Any errors are unintentional.  Halina Maidens, MD [facility]  06/12/2020

## 2020-06-13 LAB — COMPREHENSIVE METABOLIC PANEL
ALT: 13 IU/L (ref 0–32)
AST: 15 IU/L (ref 0–40)
Albumin/Globulin Ratio: 1.9 (ref 1.2–2.2)
Albumin: 5.1 g/dL — ABNORMAL HIGH (ref 3.6–4.6)
Alkaline Phosphatase: 49 IU/L (ref 44–121)
BUN/Creatinine Ratio: 14 (ref 12–28)
BUN: 10 mg/dL (ref 8–27)
Bilirubin Total: 0.3 mg/dL (ref 0.0–1.2)
CO2: 25 mmol/L (ref 20–29)
Calcium: 10 mg/dL (ref 8.7–10.3)
Chloride: 101 mmol/L (ref 96–106)
Creatinine, Ser: 0.7 mg/dL (ref 0.57–1.00)
GFR calc Af Amer: 93 mL/min/{1.73_m2} (ref >59)
GFR calc non Af Amer: 81 mL/min/{1.73_m2} (ref >59)
Globulin, Total: 2.7 g/dL (ref 1.5–4.5)
Glucose: 108 mg/dL — ABNORMAL HIGH (ref 65–99)
Potassium: 3.9 mmol/L (ref 3.5–5.2)
Sodium: 142 mmol/L (ref 134–144)
Total Protein: 7.8 g/dL (ref 6.0–8.5)

## 2020-06-13 LAB — CBC WITH DIFFERENTIAL/PLATELET
Basophils Absolute: 0 10*3/uL (ref 0.0–0.2)
Basos: 1 %
EOS (ABSOLUTE): 0 10*3/uL (ref 0.0–0.4)
Eos: 1 %
Hematocrit: 44.8 % (ref 34.0–46.6)
Hemoglobin: 14.3 g/dL (ref 11.1–15.9)
Immature Grans (Abs): 0 10*3/uL (ref 0.0–0.1)
Immature Granulocytes: 0 %
Lymphocytes Absolute: 1.5 10*3/uL (ref 0.7–3.1)
Lymphs: 39 %
MCH: 29.2 pg (ref 26.6–33.0)
MCHC: 31.9 g/dL (ref 31.5–35.7)
MCV: 92 fL (ref 79–97)
Monocytes Absolute: 0.4 10*3/uL (ref 0.1–0.9)
Monocytes: 10 %
Neutrophils Absolute: 1.9 10*3/uL (ref 1.4–7.0)
Neutrophils: 49 %
Platelets: 351 10*3/uL (ref 150–450)
RBC: 4.89 x10E6/uL (ref 3.77–5.28)
RDW: 12.2 % (ref 11.7–15.4)
WBC: 3.9 10*3/uL (ref 3.4–10.8)

## 2020-06-13 LAB — TSH: TSH: 1.24 u[IU]/mL (ref 0.450–4.500)

## 2020-06-13 LAB — LIPID PANEL
Chol/HDL Ratio: 2.6 ratio (ref 0.0–4.4)
Cholesterol, Total: 241 mg/dL — ABNORMAL HIGH (ref 100–199)
HDL: 92 mg/dL (ref >39)
LDL Chol Calc (NIH): 134 mg/dL — ABNORMAL HIGH (ref 0–99)
Triglycerides: 86 mg/dL (ref 0–149)
VLDL Cholesterol Cal: 15 mg/dL (ref 5–40)

## 2020-06-13 LAB — VITAMIN D 25 HYDROXY (VIT D DEFICIENCY, FRACTURES): Vit D, 25-Hydroxy: 31.6 ng/mL (ref 30.0–100.0)

## 2020-07-07 ENCOUNTER — Other Ambulatory Visit: Payer: Self-pay | Admitting: Internal Medicine

## 2020-07-07 DIAGNOSIS — I10 Essential (primary) hypertension: Secondary | ICD-10-CM | POA: Diagnosis not present

## 2020-07-07 DIAGNOSIS — M542 Cervicalgia: Secondary | ICD-10-CM

## 2020-07-07 DIAGNOSIS — H2511 Age-related nuclear cataract, right eye: Secondary | ICD-10-CM | POA: Diagnosis not present

## 2020-07-07 NOTE — Telephone Encounter (Signed)
Requested medication (s) are due for refill today yes  Requested medication (s) are on the active medication list:yes  Last refill: 04/29/20  #30  1 refil  Future visit scheduled  yes 07/15/20  Notes to clinic: Not delegated  Requested Prescriptions  Pending Prescriptions Disp Refills   baclofen (LIORESAL) 10 MG tablet [Pharmacy Med Name: BACLOFEN 10MG  TABLETS] 30 tablet 1    Sig: TAKE 1 TABLET(10 MG) BY MOUTH AT BEDTIME      Not Delegated - Analgesics:  Muscle Relaxants Failed - 07/07/2020  5:22 PM      Failed - This refill cannot be delegated      Passed - Valid encounter within last 6 months    Recent Outpatient Visits           3 weeks ago Annual physical exam   [facility] Glean Hess, MD   2 months ago Posterior neck pain   [facility] Glean Hess, MD   3 months ago Bilateral posterior neck pain   [facility] Glean Hess, MD   7 months ago Essential (primary) hypertension   [facility] Glean Hess, MD   1 year ago Annual physical exam   [facility] Glean Hess, MD       Future Appointments             In 1 week Army Melia Jesse Sans, MD [facility], [place]   In 11 months Army Melia Jesse Sans, MD [facility], [facility]

## 2020-07-12 ENCOUNTER — Other Ambulatory Visit: Payer: Self-pay | Admitting: Internal Medicine

## 2020-07-12 DIAGNOSIS — I10 Essential (primary) hypertension: Secondary | ICD-10-CM

## 2020-07-12 NOTE — Telephone Encounter (Signed)
  Notes to clinic This rx was filled 8/4, the current med list is for 2 tablets daily (10/2019), this rx is for 1 table twice a day. Please clarify current dose.

## 2020-07-15 ENCOUNTER — Other Ambulatory Visit: Payer: Self-pay

## 2020-07-15 ENCOUNTER — Encounter: Payer: Self-pay | Admitting: Internal Medicine

## 2020-07-15 ENCOUNTER — Ambulatory Visit (INDEPENDENT_AMBULATORY_CARE_PROVIDER_SITE_OTHER): Payer: Medicare Other | Admitting: Internal Medicine

## 2020-07-15 VITALS — BP 136/62 | HR 81 | Temp 97.8°F | Ht 67.0 in | Wt 127.0 lb

## 2020-07-15 DIAGNOSIS — J3489 Other specified disorders of nose and nasal sinuses: Secondary | ICD-10-CM

## 2020-07-15 DIAGNOSIS — I10 Essential (primary) hypertension: Secondary | ICD-10-CM | POA: Diagnosis not present

## 2020-07-15 NOTE — Progress Notes (Signed)
Date:  07/15/2020   Name:  Kendra Lane   DOB:  [birth year]   MRN:  [id number]   Chief Complaint: Hypertension (Follow up from September on BP.)  Hypertension This is a chronic problem. The problem has been rapidly improving since onset. The problem is controlled. Pertinent negatives include no chest pain, headaches, orthopnea, palpitations, peripheral edema or shortness of breath. Past treatments include beta blockers, calcium channel blockers and angiotensin blockers (benicar added last visit). The current treatment provides significant improvement. There are no compliance problems.     Lab Results  Component Value Date   CREATININE 0.70 06/12/2020   BUN 10 06/12/2020   NA 142 06/12/2020   K 3.9 06/12/2020   CL 101 06/12/2020   CO2 25 06/12/2020   Lab Results  Component Value Date   CHOL 241 (H) 06/12/2020   HDL 92 06/12/2020   LDLCALC 134 (H) 06/12/2020   TRIG 86 06/12/2020   CHOLHDL 2.6 06/12/2020   Lab Results  Component Value Date   TSH 1.240 06/12/2020   Lab Results  Component Value Date   HGBA1C 5.7 (H) 06/12/2019   Lab Results  Component Value Date   WBC 3.9 06/12/2020   HGB 14.3 06/12/2020   HCT 44.8 06/12/2020   MCV 92 06/12/2020   PLT 351 06/12/2020   Lab Results  Component Value Date   ALT 13 06/12/2020   AST 15 06/12/2020   ALKPHOS 49 06/12/2020   BILITOT 0.3 06/12/2020     Review of Systems  Constitutional: Negative for chills, fatigue and fever.  Eyes: Positive for visual disturbance (having cataract surgery soon).  Respiratory: Negative for shortness of breath.   Cardiovascular: Negative for chest pain, palpitations and orthopnea.  Gastrointestinal: Negative for abdominal pain.  Skin:       Sore in nose  Neurological: Negative for dizziness, light-headedness and headaches.  Psychiatric/Behavioral: Negative for dysphoric mood. The patient is not nervous/anxious.     Patient Active Problem List   Diagnosis Date Noted  .  Age-related osteoporosis without current pathological fracture 06/12/2020  . Aortic atherosclerosis ([place]) 04/02/2020  . Arthritis of shoulder 11/30/2017  . Bigeminy 04/07/2016  . Benign esophageal stricture 01/21/2015  . Essential (primary) hypertension 01/21/2015  . Hammer toe 01/21/2015  . Lipoma of skin and subcutaneous tissue of trunk 01/21/2015    No Known Allergies  Past Surgical History:  Procedure Laterality Date  . ABDOMINAL HYSTERECTOMY    . ESOPHAGOGASTRODUODENOSCOPY (EGD) WITH ESOPHAGEAL DILATION  10/2014   Dr. Allen Norris  . TOTAL VAGINAL HYSTERECTOMY  1993    Social History   Tobacco Use  . Smoking status: Never Smoker  . Smokeless tobacco: Never Used  . Tobacco comment: smoking cessation materials not required  Vaping Use  . Vaping Use: Never used  Substance Use Topics  . Alcohol use: Not Currently    Alcohol/week: 2.0 standard drinks    Types: 2 Cans of beer per week    Comment: last use 08/2019  . Drug use: No     Medication list has been reviewed and updated.  Current Meds  Medication Sig  . amLODipine (NORVASC) 10 MG tablet TAKE 1 TABLET BY MOUTH DAILY  . aspirin 81 MG tablet Take 81 mg by mouth daily.  . baclofen (LIORESAL) 10 MG tablet TAKE 1 TABLET(10 MG) BY MOUTH AT BEDTIME  . calcium-vitamin D (OSCAL WITH D) 500-200 MG-UNIT tablet Take 1 tablet by mouth. 600 mg daily  . metoprolol succinate (TOPROL-XL) 25  MG 24 hr tablet TAKE 2 TABLETS(50 MG) BY MOUTH DAILY  . Multiple Vitamins-Minerals (CENTRUM SILVER 50+WOMEN PO) Take 1 tablet by mouth daily.  Marland Kitchen olmesartan (BENICAR) 20 MG tablet Take 1 tablet (20 mg total) by mouth daily.  Marland Kitchen omeprazole (PRILOSEC) 20 MG capsule TAKE 1 CAPSULE(20 MG) BY MOUTH DAILY  . traMADol-acetaminophen (ULTRACET) 37.5-325 MG tablet Take 1 tablet by mouth every 6 (six) hours as needed.    PHQ 2/9 Scores 07/15/2020 06/04/2020 04/02/2020 12/10/2019  PHQ - 2 Score 0 1 0 0  PHQ- 9 Score 0 1 2 0    GAD 7 : Generalized Anxiety Score  07/15/2020 04/02/2020  Nervous, Anxious, on Edge 0 0  Control/stop worrying 0 0  Worry too much - different things 0 0  Trouble relaxing 0 0  Restless 0 0  Easily annoyed or irritable 0 0  Afraid - awful might happen 0 0  Total GAD 7 Score 0 0  Anxiety Difficulty Not difficult at all Not difficult at all    BP Readings from Last 3 Encounters:  07/15/20 136/62  06/12/20 (!) 164/78  06/04/20 (!) 168/88    Physical Exam Vitals and nursing note reviewed.  Constitutional:      General: She is not in acute distress.    Appearance: Normal appearance. She is well-developed.  HENT:     Head: Normocephalic and atraumatic.     Nose:     Comments: Small ulceration in left nares Cardiovascular:     Rate and Rhythm: Normal rate and regular rhythm.     Pulses: Normal pulses.     Heart sounds: No murmur heard.   Pulmonary:     Effort: Pulmonary effort is normal. No respiratory distress.     Breath sounds: No wheezing or rhonchi.  Musculoskeletal:     Cervical back: Normal range of motion.     Right lower leg: No edema.     Left lower leg: No edema.  Skin:    General: Skin is warm and dry.     Findings: No rash.  Neurological:     Mental Status: She is alert and oriented to person, place, and time.  Psychiatric:        Behavior: Behavior normal.        Thought Content: Thought content normal.     Wt Readings from Last 3 Encounters:  07/15/20 127 lb (57.6 kg)  06/12/20 126 lb (57.2 kg)  06/04/20 126 lb (57.2 kg)    BP 136/62   Pulse 81   Temp 97.8 F (36.6 C) (Oral)   Ht 5\' 7"  (1.702 m)   Wt 127 lb (57.6 kg)   SpO2 98%   BMI 19.89 kg/m   Assessment and Plan: 1. Essential (primary) hypertension BP much improved with the addition of Benicar Pt is tolerating medications well. Continue current therapy  2. Lesion of nasal cavity Recommend TAO bid until healed   Partially dictated using Editor, commissioning. Any errors are unintentional.  Halina Maidens, MD [facility]  07/15/2020

## 2020-07-17 ENCOUNTER — Other Ambulatory Visit: Payer: Self-pay

## 2020-07-17 ENCOUNTER — Ambulatory Visit: Payer: Self-pay | Admitting: *Deleted

## 2020-07-17 ENCOUNTER — Encounter: Payer: Self-pay | Admitting: Ophthalmology

## 2020-07-17 NOTE — Telephone Encounter (Signed)
Pt called with complaints of pain in the top of her head that goes to her left ear; the pain has been occurring for the past few months; she says she spoke with Dr Army Melia; the pt says the pain originally went from her head to her left shoulder to neck to her head; she rates her pain at 4-5 out of 10; she has been taking Tylenol with not relief in her symptoms; recommendations made per nurse triage protocol; decision tree completed; spoke with Estill Bamberg regarding scheduling pt; pt transferred to [facility] for final disposition.  Reason for Disposition  [1] MILD-MODERATE headache AND [2] present > 72 hours  Answer Assessment - Initial Assessment Questions 1. LOCATION: "Where does it hurt?"      Top of head and goes to her left ear 2. ONSET: "When did the headache start?" (Minutes, hours or days)     September 2021 3. PATTERN: "Does the pain come and go, or has it been constant since it started?"     intermittent 4. SEVERITY: "How bad is the pain?" and "What does it keep you from doing?"  (e.g., Scale 1-10; mild, moderate, or severe)   - MILD (1-3): doesn't interfere with normal activities    - MODERATE (4-7): interferes with normal activities or awakens from sleep    - SEVERE (8-10): excruciating pain, unable to do any normal activities        4-5 out of 10 5. RECURRENT SYMPTOM: "Have you ever had headaches before?" If Yes, ask: "When was the last time?" and "What happened that time?"      no 6. CAUSE: "What do you think is causing the headache?"    Not sure 7. MIGRAINE: "Have you been diagnosed with migraine headaches?" If Yes, ask: "Is this headache similar?"    no 8. HEAD INJURY: "Has there been any recent injury to the head?"    no 9. OTHER SYMPTOMS: "Do you have any other symptoms?" (fever, stiff neck, eye pain, sore throat, cold symptoms)     no 10. PREGNANCY: "Is there any chance you are pregnant?" "When was your last menstrual period?"       no  Protocols used: HEADACHE-A-AH

## 2020-07-21 ENCOUNTER — Other Ambulatory Visit: Payer: Self-pay

## 2020-07-21 ENCOUNTER — Other Ambulatory Visit
Admission: RE | Admit: 2020-07-21 | Discharge: 2020-07-21 | Disposition: A | Discharge status: Home / Self Care | Payer: Medicare Other | Source: Ambulatory Visit | Attending: Ophthalmology | Admitting: Ophthalmology

## 2020-07-21 DIAGNOSIS — Z01818 Encounter for other preprocedural examination: Secondary | ICD-10-CM | POA: Insufficient documentation

## 2020-07-21 DIAGNOSIS — Z20822 Contact with and (suspected) exposure to covid-19: Secondary | ICD-10-CM | POA: Insufficient documentation

## 2020-07-21 NOTE — Discharge Instructions (Signed)

## 2020-07-22 ENCOUNTER — Encounter: Payer: Self-pay | Admitting: Internal Medicine

## 2020-07-22 ENCOUNTER — Ambulatory Visit (INDEPENDENT_AMBULATORY_CARE_PROVIDER_SITE_OTHER): Payer: Medicare Other | Admitting: Internal Medicine

## 2020-07-22 VITALS — BP 142/68 | HR 95 | Temp 98.3°F | Ht 67.0 in | Wt 127.0 lb

## 2020-07-22 DIAGNOSIS — G44219 Episodic tension-type headache, not intractable: Secondary | ICD-10-CM | POA: Diagnosis not present

## 2020-07-22 DIAGNOSIS — I10 Essential (primary) hypertension: Secondary | ICD-10-CM

## 2020-07-22 LAB — SARS CORONAVIRUS 2 (TAT 6-24 HRS): SARS Coronavirus 2: NEGATIVE

## 2020-07-22 NOTE — Progress Notes (Signed)
Date:  07/22/2020   Name:  Kendra Lane   DOB:  June 14, 1935   MRN:  [id number]   Chief Complaint: Headache (X1 month,  around pt entire head, pain comes and goes, taking tylenol doesnt help for long , want to know if its ok to get surgery tomorrow )  Headache  This is a new problem. The current episode started 1 to 4 weeks ago. The problem occurs intermittently. The problem has been unchanged. Pain location: encompasses the whole head. The pain does not radiate. The pain quality is similar to prior headaches. The quality of the pain is described as shooting (that last only seconds). The pain is mild. Associated symptoms include scalp tenderness. Pertinent negatives include no blurred vision, dizziness, nausea, numbness, phonophobia, photophobia, sinus pressure, sore throat or visual change. Nothing aggravates the symptoms. She has tried acetaminophen for the symptoms. The treatment provided mild (temporary relief) relief. Her past medical history is significant for hypertension.  Hypertension Associated symptoms include headaches. Pertinent negatives include no blurred vision, chest pain, palpitations or shortness of breath.    Lab Results  Component Value Date   CREATININE 0.70 06/12/2020   BUN 10 06/12/2020   NA 142 06/12/2020   K 3.9 06/12/2020   CL 101 06/12/2020   CO2 25 06/12/2020   Lab Results  Component Value Date   CHOL 241 (H) 06/12/2020   HDL 92 06/12/2020   LDLCALC 134 (H) 06/12/2020   TRIG 86 06/12/2020   CHOLHDL 2.6 06/12/2020   Lab Results  Component Value Date   TSH 1.240 06/12/2020   Lab Results  Component Value Date   HGBA1C 5.7 (H) 06/12/2019   Lab Results  Component Value Date   WBC 3.9 06/12/2020   HGB 14.3 06/12/2020   HCT 44.8 06/12/2020   MCV 92 06/12/2020   PLT 351 06/12/2020   Lab Results  Component Value Date   ALT 13 06/12/2020   AST 15 06/12/2020   ALKPHOS 49 06/12/2020   BILITOT 0.3 06/12/2020     Review of Systems    Constitutional: Negative for chills and fatigue.  HENT: Negative for sinus pressure and sore throat.   Eyes: Negative for blurred vision and photophobia.  Respiratory: Negative for shortness of breath and wheezing.   Cardiovascular: Negative for chest pain and palpitations.  Gastrointestinal: Negative for nausea.  Neurological: Positive for headaches. Negative for dizziness, syncope, light-headedness and numbness.    Patient Active Problem List   Diagnosis Date Noted  . Age-related osteoporosis without current pathological fracture 06/12/2020  . Aortic atherosclerosis ([place]) 04/02/2020  . Arthritis of shoulder 11/30/2017  . Bigeminy 04/07/2016  . Benign esophageal stricture 01/21/2015  . Essential (primary) hypertension 01/21/2015  . Hammer toe 01/21/2015  . Lipoma of skin and subcutaneous tissue of trunk 01/21/2015    No Known Allergies  Past Surgical History:  Procedure Laterality Date  . ABDOMINAL HYSTERECTOMY    . ESOPHAGOGASTRODUODENOSCOPY (EGD) WITH ESOPHAGEAL DILATION  10/2014   Dr. Allen Norris  . TOTAL VAGINAL HYSTERECTOMY  1993  . TUBAL LIGATION      Social History   Tobacco Use  . Smoking status: Never Smoker  . Smokeless tobacco: Never Used  . Tobacco comment: smoking cessation materials not required  Vaping Use  . Vaping Use: Never used  Substance Use Topics  . Alcohol use: Not Currently    Alcohol/week: 2.0 standard drinks    Types: 2 Cans of beer per week    Comment: last use  08/2019  . Drug use: No     Medication list has been reviewed and updated.  Current Meds  Medication Sig  . acetaminophen (TYLENOL) 500 MG tablet Take 500 mg by mouth every 6 (six) hours as needed for headache.  Marland Kitchen amLODipine (NORVASC) 10 MG tablet TAKE 1 TABLET BY MOUTH DAILY  . aspirin 81 MG tablet Take 81 mg by mouth daily.  . baclofen (LIORESAL) 10 MG tablet TAKE 1 TABLET(10 MG) BY MOUTH AT BEDTIME  . calcium-vitamin D (OSCAL WITH D) 500-200 MG-UNIT tablet Take 1 tablet by  mouth. 600 mg daily  . metoprolol succinate (TOPROL-XL) 25 MG 24 hr tablet TAKE 2 TABLETS(50 MG) BY MOUTH DAILY  . Multiple Vitamins-Minerals (CENTRUM SILVER 50+WOMEN PO) Take 1 tablet by mouth daily.  Marland Kitchen olmesartan (BENICAR) 20 MG tablet Take 1 tablet (20 mg total) by mouth daily.  Marland Kitchen omeprazole (PRILOSEC) 20 MG capsule TAKE 1 CAPSULE(20 MG) BY MOUTH DAILY    PHQ 2/9 Scores 07/22/2020 07/15/2020 06/04/2020 04/02/2020  PHQ - 2 Score 0 0 1 0  PHQ- 9 Score 0 0 1 2    GAD 7 : Generalized Anxiety Score 07/22/2020 07/15/2020 04/02/2020  Nervous, Anxious, on Edge 1 0 0  Control/stop worrying 0 0 0  Worry too much - different things 0 0 0  Trouble relaxing 0 0 0  Restless 0 0 0  Easily annoyed or irritable 0 0 0  Afraid - awful might happen 0 0 0  Total GAD 7 Score 1 0 0  Anxiety Difficulty Not difficult at all Not difficult at all Not difficult at all    BP Readings from Last 3 Encounters:  07/22/20 (!) 142/68  07/15/20 136/62  06/12/20 (!) 164/78    Physical Exam Vitals and nursing note reviewed.  Constitutional:      General: She is not in acute distress.    Appearance: She is well-developed.  HENT:     Head: Normocephalic and atraumatic.     Comments: No TMJ or TA tenderness or mass Scalp non tender Eyes:     Extraocular Movements: Extraocular movements intact.  Cardiovascular:     Rate and Rhythm: Normal rate and regular rhythm.  Pulmonary:     Effort: Pulmonary effort is normal. No respiratory distress.     Breath sounds: No wheezing or rhonchi.  Musculoskeletal:     Cervical back: No rigidity or bony tenderness. No pain with movement. Decreased range of motion.  Skin:    General: Skin is warm and dry.     Findings: No rash.  Neurological:     General: No focal deficit present.     Mental Status: She is alert and oriented to person, place, and time.     Gait: Gait is intact.  Psychiatric:        Attention and Perception: Attention normal.        Mood and Affect: Mood  normal.        Speech: Speech normal.        Cognition and Memory: Cognition normal.        Judgment: Judgment normal.     Wt Readings from Last 3 Encounters:  07/22/20 127 lb (57.6 kg)  07/15/20 127 lb (57.6 kg)  06/12/20 126 lb (57.2 kg)    BP (!) 142/68   Pulse 95   Temp 98.3 F (36.8 C) (Oral)   Ht 5\' 7"  (1.702 m)   Wt 127 lb (57.6 kg)   SpO2 100%  BMI 19.89 kg/m   Assessment and Plan: 1. Episodic tension-type headache, not intractable Fleeting scalp pains due to muscle tension Pt is reassured that she can proceed with Cataracts surgery tomorrow Consider low dose Elavil nightly if HA continues  2. Essential (primary) hypertension Clinically stable exam with well controlled BP. Do not think that HTN is contributing to her tension type headaches Pt to continue current regimen and low sodium diet; benefits of regular exercise as able discussed.     Partially dictated using Editor, commissioning. Any errors are unintentional.  Halina Maidens, MD [facility]  07/22/2020

## 2020-07-23 ENCOUNTER — Encounter: Payer: Self-pay | Admitting: Ophthalmology

## 2020-07-23 ENCOUNTER — Ambulatory Visit: Payer: Medicare Other | Admitting: Anesthesiology

## 2020-07-23 ENCOUNTER — Ambulatory Visit
Admission: RE | Admit: 2020-07-23 | Discharge: 2020-07-23 | Disposition: A | Discharge status: Home / Self Care | Payer: Medicare Other | Attending: Ophthalmology | Admitting: Ophthalmology

## 2020-07-23 ENCOUNTER — Encounter
Admission: RE | Disposition: A | Discharge status: Home / Self Care | Payer: Self-pay | Source: Home / Self Care | Attending: Ophthalmology

## 2020-07-23 ENCOUNTER — Other Ambulatory Visit: Payer: Self-pay

## 2020-07-23 DIAGNOSIS — H2511 Age-related nuclear cataract, right eye: Secondary | ICD-10-CM | POA: Insufficient documentation

## 2020-07-23 DIAGNOSIS — H25811 Combined forms of age-related cataract, right eye: Secondary | ICD-10-CM | POA: Diagnosis not present

## 2020-07-23 DIAGNOSIS — Z7982 Long term (current) use of aspirin: Secondary | ICD-10-CM | POA: Insufficient documentation

## 2020-07-23 DIAGNOSIS — Z79899 Other long term (current) drug therapy: Secondary | ICD-10-CM | POA: Diagnosis not present

## 2020-07-23 HISTORY — DX: Cardiac arrhythmia, unspecified: I49.9

## 2020-07-23 HISTORY — DX: Cervicalgia: M54.2

## 2020-07-23 HISTORY — PX: CATARACT EXTRACTION W/PHACO: SHX586

## 2020-07-23 HISTORY — DX: Pain in left shoulder: M25.512

## 2020-07-23 HISTORY — DX: Headache, unspecified: R51.9

## 2020-07-23 SURGERY — PHACOEMULSIFICATION, CATARACT, WITH IOL INSERTION
Anesthesia: Monitor Anesthesia Care | Site: Eye | Laterality: Right

## 2020-07-23 MED ORDER — LIDOCAINE HCL (PF) 2 % IJ SOLN
INTRAOCULAR | Status: DC | PRN
  Administered 2020-07-23: 2 mL

## 2020-07-23 MED ORDER — FENTANYL CITRATE (PF) 100 MCG/2ML IJ SOLN
INTRAMUSCULAR | Status: DC | PRN
  Administered 2020-07-23: 50 ug via INTRAVENOUS

## 2020-07-23 MED ORDER — LACTATED RINGERS IV SOLN: INTRAVENOUS | Status: DC

## 2020-07-23 MED ORDER — CEFUROXIME OPHTHALMIC INJECTION 1 MG/0.1 ML
INJECTION | OPHTHALMIC | Status: DC | PRN
  Administered 2020-07-23: 0.1 mL via INTRACAMERAL

## 2020-07-23 MED ORDER — TETRACAINE HCL 0.5 % OP SOLN
1.0000 [drp] | OPHTHALMIC | Status: DC | PRN
  Administered 2020-07-23 (×3): 1 [drp] via OPHTHALMIC

## 2020-07-23 MED ORDER — NA HYALUR & NA CHOND-NA HYALUR 0.4-0.35 ML IO KIT
PACK | INTRAOCULAR | Status: DC | PRN
  Administered 2020-07-23: 1 mL via INTRAOCULAR

## 2020-07-23 MED ORDER — OXYCODONE HCL 5 MG PO TABS: 5.0000 mg | ORAL_TABLET | Freq: Once | ORAL | Status: DC | PRN

## 2020-07-23 MED ORDER — EPINEPHRINE PF 1 MG/ML IJ SOLN
INTRAOCULAR | Status: DC | PRN
  Administered 2020-07-23: 87 mL via OPHTHALMIC

## 2020-07-23 MED ORDER — BRIMONIDINE TARTRATE-TIMOLOL 0.2-0.5 % OP SOLN
OPHTHALMIC | Status: DC | PRN
  Administered 2020-07-23: 1 [drp] via OPHTHALMIC

## 2020-07-23 MED ORDER — ARMC OPHTHALMIC DILATING DROPS
1.0000 "application " | OPHTHALMIC | Status: DC | PRN
  Administered 2020-07-23 (×3): 1 via OPHTHALMIC

## 2020-07-23 MED ORDER — OXYCODONE HCL 5 MG/5ML PO SOLN: 5.0000 mg | Freq: Once | ORAL | Status: DC | PRN

## 2020-07-23 MED ORDER — MOXIFLOXACIN HCL 0.5 % OP SOLN
1.0000 [drp] | OPHTHALMIC | Status: DC | PRN
  Administered 2020-07-23 (×3): 1 [drp] via OPHTHALMIC

## 2020-07-23 SURGICAL SUPPLY — 22 items
CANNULA ANT/CHMB 27G (MISCELLANEOUS) ×1 IMPLANT
CANNULA ANT/CHMB 27GA (MISCELLANEOUS) ×3 IMPLANT
GLOVE SURG LX 7.5 STRW (GLOVE) ×2
GLOVE SURG LX STRL 7.5 STRW (GLOVE) ×1 IMPLANT
GLOVE SURG TRIUMPH 8.0 PF LTX (GLOVE) ×3 IMPLANT
GOWN STRL REUS W/ TWL LRG LVL3 (GOWN DISPOSABLE) ×2 IMPLANT
GOWN STRL REUS W/TWL LRG LVL3 (GOWN DISPOSABLE) ×6
LENS IOL TECNIS EYHANCE 22.0 (Intraocular Lens) ×2 IMPLANT
MARKER SKIN DUAL TIP RULER LAB (MISCELLANEOUS) ×3 IMPLANT
NDL CAPSULORHEX 25GA (NEEDLE) ×1 IMPLANT
NDL FILTER BLUNT 18X1 1/2 (NEEDLE) ×2 IMPLANT
NEEDLE CAPSULORHEX 25GA (NEEDLE) ×3 IMPLANT
NEEDLE FILTER BLUNT 18X 1/2SAF (NEEDLE) ×4
NEEDLE FILTER BLUNT 18X1 1/2 (NEEDLE) ×2 IMPLANT
PACK CATARACT BRASINGTON (MISCELLANEOUS) ×3 IMPLANT
PACK EYE AFTER SURG (MISCELLANEOUS) ×3 IMPLANT
PACK OPTHALMIC (MISCELLANEOUS) ×3 IMPLANT
SOLUTION OPHTHALMIC SALT (MISCELLANEOUS) ×3 IMPLANT
SYR 3ML LL SCALE MARK (SYRINGE) ×6 IMPLANT
SYR TB 1ML LUER SLIP (SYRINGE) ×3 IMPLANT
WATER STERILE IRR 250ML POUR (IV SOLUTION) ×3 IMPLANT
WIPE NON LINTING 3.25X3.25 (MISCELLANEOUS) ×3 IMPLANT

## 2020-07-23 NOTE — Anesthesia Procedure Notes (Signed)
Date/Time: 07/23/2020 12:21 PM Performed by: Dionne Bucy, CRNA Pre-anesthesia Checklist: Patient identified, Emergency Drugs available, Suction available, Patient being monitored and Timeout performed Oxygen Delivery Method: Nasal cannula

## 2020-07-23 NOTE — Anesthesia Postprocedure Evaluation (Signed)
Anesthesia Post Note  Patient: Kendra Lane  Procedure(s) Performed: CATARACT EXTRACTION PHACO AND INTRAOCULAR LENS PLACEMENT (IOC) RIGHT 7.62 01:26.5 8.8% (Right Eye)     Patient location during evaluation: PACU Anesthesia Type: MAC Level of consciousness: awake and alert Pain management: pain level controlled Vital Signs Assessment: post-procedure vital signs reviewed and stable Respiratory status: spontaneous breathing, nonlabored ventilation, respiratory function stable and patient connected to nasal cannula oxygen Cardiovascular status: stable and blood pressure returned to baseline Postop Assessment: no apparent nausea or vomiting Anesthetic complications: no   No complications documented.  Fidel Levy

## 2020-07-23 NOTE — Anesthesia Preprocedure Evaluation (Signed)
Anesthesia Evaluation  Patient identified by MRN, date of birth, ID band Patient awake    Reviewed: NPO status   History of Anesthesia Complications Negative for: history of anesthetic complications  Airway Mallampati: II  TM Distance: >3 FB Neck ROM: full    Dental no notable dental hx.    Pulmonary neg pulmonary ROS,    Pulmonary exam normal        Cardiovascular Exercise Tolerance: Good hypertension, Normal cardiovascular exam     Neuro/Psych  Headaches, negative psych ROS   GI/Hepatic Neg liver ROS, GERD  Controlled,  Endo/Other  negative endocrine ROS  Renal/GU negative Renal ROS  negative genitourinary   Musculoskeletal  (+) Arthritis ,   Abdominal   Peds  Hematology negative hematology ROS (+)   Anesthesia Other Findings Covid: NEG.  Reproductive/Obstetrics                             Anesthesia Physical Anesthesia Plan  ASA: II  Anesthesia Plan: MAC   Post-op Pain Management:    Induction:   PONV Risk Score and Plan: 2 and TIVA and Midazolam  Airway Management Planned:   Additional Equipment:   Intra-op Plan:   Post-operative Plan:   Informed Consent: I have reviewed the patients History and Physical, chart, labs and discussed the procedure including the risks, benefits and alternatives for the proposed anesthesia with the patient or authorized representative who has indicated his/her understanding and acceptance.       Plan Discussed with: CRNA  Anesthesia Plan Comments:         Anesthesia Quick Evaluation

## 2020-07-23 NOTE — Transfer of Care (Signed)
Immediate Anesthesia Transfer of Care Note  Patient: Kendra Lane  Procedure(s) Performed: CATARACT EXTRACTION PHACO AND INTRAOCULAR LENS PLACEMENT (IOC) RIGHT 7.62 01:26.5 8.8% (Right Eye)  Patient Location: PACU  Anesthesia Type: MAC  Level of Consciousness: awake, alert  and patient cooperative  Airway and Oxygen Therapy: Patient Spontanous Breathing and Patient connected to supplemental oxygen  Post-op Assessment: Post-op Vital signs reviewed, Patient's Cardiovascular Status Stable, Respiratory Function Stable, Patent Airway and No signs of Nausea or vomiting  Post-op Vital Signs: Reviewed and stable  Complications: No complications documented.

## 2020-07-23 NOTE — H&P (Signed)

## 2020-07-23 NOTE — Op Note (Signed)
LOCATION:  [place]   PREOPERATIVE DIAGNOSIS:    Nuclear sclerotic cataract right eye. H25.11   POSTOPERATIVE DIAGNOSIS:  Nuclear sclerotic cataract right eye.     PROCEDURE:  Phacoemusification with posterior chamber intraocular lens placement of the right eye   ULTRASOUND TIME: Procedure(s): CATARACT EXTRACTION PHACO AND INTRAOCULAR LENS PLACEMENT (IOC) RIGHT 7.62 01:26.5 8.8% (Right)  LENS:   Implant Name Type Inv. Item Serial No. Manufacturer Lot No. LRB No. Used Action  LENS IOL TECNIS EYHANCE 22.0 - [id number] Intraocular Lens LENS IOL TECNIS EYHANCE 22.0 [id number]   Right 1 Implanted         SURGEON:  Wyonia Hough, MD   ANESTHESIA:  Topical with tetracaine drops and 2% Xylocaine jelly, augmented with 1% preservative-free intracameral lidocaine.    COMPLICATIONS:  None.   DESCRIPTION OF PROCEDURE:  The patient was identified in the holding room and transported to the operating room and placed in the supine position under the operating microscope.  The right eye was identified as the operative eye and it was prepped and draped in the usual sterile ophthalmic fashion.   A 1 millimeter clear-corneal paracentesis was made at the 12:00 position.  0.5 ml of preservative-free 1% lidocaine was injected into the anterior chamber. The anterior chamber was filled with Viscoat viscoelastic.  A 2.4 millimeter keratome was used to make a near-clear corneal incision at the 9:00 position.  A curvilinear capsulorrhexis was made with a cystotome and capsulorrhexis forceps.  Balanced salt solution was used to hydrodissect and hydrodelineate the nucleus.   Phacoemulsification was then used in stop and chop fashion to remove the lens nucleus and epinucleus.  The remaining cortex was then removed using the irrigation and aspiration handpiece. Provisc was then placed into the capsular bag to distend it for lens placement.  A lens was then injected into the capsular bag.   The remaining viscoelastic was aspirated.   Wounds were hydrated with balanced salt solution.  The anterior chamber was inflated to a physiologic pressure with balanced salt solution.  No wound leaks were noted. Cefuroxime 0.1 ml of a 10mg /ml solution was injected into the anterior chamber for a dose of 1 mg of intracameral antibiotic at the completion of the case.   Timolol and Brimonidine drops were applied to the eye.  The patient was taken to the recovery room in stable condition without complications of anesthesia or surgery.   Kelli Egolf 07/23/2020, 12:43 PM

## 2020-07-24 ENCOUNTER — Encounter: Payer: Self-pay | Admitting: Ophthalmology

## 2020-09-02 ENCOUNTER — Other Ambulatory Visit: Payer: Self-pay | Admitting: Internal Medicine

## 2020-09-02 DIAGNOSIS — I10 Essential (primary) hypertension: Secondary | ICD-10-CM

## 2020-09-25 ENCOUNTER — Telehealth: Payer: Self-pay | Admitting: Internal Medicine

## 2020-09-25 DIAGNOSIS — H2512 Age-related nuclear cataract, left eye: Secondary | ICD-10-CM | POA: Diagnosis not present

## 2020-09-25 DIAGNOSIS — I10 Essential (primary) hypertension: Secondary | ICD-10-CM | POA: Diagnosis not present

## 2020-09-25 NOTE — Telephone Encounter (Signed)
I called pt and left message for her to call back and schedule appt for pre-op clearance for eye surgery. This needs to be scheduled for early next week

## 2020-09-25 NOTE — Telephone Encounter (Signed)
Alfonzo Feller, Pre Op Nurse, from [facility] called stating that they are prepping to have the pt get ready cataracts surgery on 10/08/20. She states that the pt is experiencing a heart rate of 112-114. The doctor is requesting to have the pt checked out, so they are not having to cancel the surgery. She states that she is sending over clearance letter as well. Please advise.   [phone]

## 2020-09-30 ENCOUNTER — Ambulatory Visit: Payer: Medicare Other | Admitting: Internal Medicine

## 2020-10-01 ENCOUNTER — Telehealth: Payer: Self-pay

## 2020-10-01 NOTE — Telephone Encounter (Signed)
Kendra Lane told them that Dr. Army Lane was out of the office 1/19-1/28 and will return 1/31. Stated that we could not get in her until after her surgery date of 10/08/2020. Pt has an appt 10/15/20 for her surgical clearance. [facility] stated that they will have to reschedule her appt.   Called pt told her that her appt for [facility] would get rescheduled due to [place] not being able to see her before her surgery. Pt verbalized understanding. Told pt that her appt is 10/15/2020.   KP

## 2020-10-01 NOTE — Telephone Encounter (Signed)
Copied from [place] [id number]. Topic: General - Other >> Oct 01, 2020 12:57 PM Celene Kras wrote: Reason for CRM: Jenny Reichmann, from [facility], calling regarding the surgical clearance she received for pt. She states that the Day surgeon had questions regarding the clearance since the pt had to cancel her appt for 09/30/20.  Pt surgery is on 10/08/20. Please advise.

## 2020-10-03 ENCOUNTER — Telehealth: Payer: Self-pay | Admitting: Internal Medicine

## 2020-10-03 ENCOUNTER — Telehealth: Payer: Self-pay

## 2020-10-03 NOTE — Telephone Encounter (Signed)
Patient is calling regarding her appoint with [facility] regarding her eye surgery. Please [phone]

## 2020-10-03 NOTE — Telephone Encounter (Signed)
Called pt to schedule pt an appt for BP check.   Question:  Does pt check BP at home? And what was the last reading? If pts BP has been running normal tell pt to keep an eye on her BP and we will see her at her appt on 10/15/20. If its been running high please schedule an appt for pt with Dr. Ronnald Ramp. Dr. Army Melia (pt PCP) is out of the office this week and next week.   Does pt feel her heart racing?   KP

## 2020-10-06 ENCOUNTER — Other Ambulatory Visit: Admission: RE | Admit: 2020-10-06 | Payer: Medicare Other | Source: Ambulatory Visit

## 2020-10-06 NOTE — Telephone Encounter (Signed)
Called pt scheduled appt for tomorrow 10/07/2020.  KP

## 2020-10-06 NOTE — Telephone Encounter (Signed)
Patient has appointment tomorrow, 10/07/20 with Dr. Ronnald Ramp.

## 2020-10-07 ENCOUNTER — Other Ambulatory Visit: Payer: Self-pay

## 2020-10-07 ENCOUNTER — Ambulatory Visit (INDEPENDENT_AMBULATORY_CARE_PROVIDER_SITE_OTHER): Payer: Medicare Other | Admitting: Family Medicine

## 2020-10-07 ENCOUNTER — Encounter: Payer: Self-pay | Admitting: Family Medicine

## 2020-10-07 VITALS — BP 150/88 | HR 100 | Ht 66.0 in | Wt 127.0 lb

## 2020-10-07 DIAGNOSIS — R002 Palpitations: Secondary | ICD-10-CM

## 2020-10-07 DIAGNOSIS — R000 Tachycardia, unspecified: Secondary | ICD-10-CM | POA: Diagnosis not present

## 2020-10-07 DIAGNOSIS — I10 Essential (primary) hypertension: Secondary | ICD-10-CM | POA: Diagnosis not present

## 2020-10-07 NOTE — Progress Notes (Signed)
Date:  10/07/2020   Name:  Kendra Lane   DOB:  12-May-1935   MRN:  [id number]   Chief Complaint: Tachycardia  Palpitations  This is a new problem. The current episode started 1 to 4 weeks ago. The problem occurs daily. The problem has been waxing and waning. The symptoms are aggravated by caffeine (hot tea). Associated symptoms include numbness. Pertinent negatives include no anxiety, chest fullness, chest pain, coughing, diaphoresis, dizziness, fever, irregular heartbeat, malaise/fatigue, nausea, near-syncope, shortness of breath, syncope, vomiting or weakness. She has tried beta blockers for the symptoms.  Hypertension This is a chronic problem. The current episode started more than 1 year ago. The problem has been waxing and waning since onset. The problem is controlled. Associated symptoms include headaches and palpitations. Pertinent negatives include no chest pain, malaise/fatigue or shortness of breath. The current treatment provides mild improvement. There are no compliance problems.  There is no history of angina, kidney disease, CAD/MI, CVA, heart failure, left ventricular hypertrophy, PVD or retinopathy. There is no history of chronic renal disease, a hypertension causing med or renovascular disease.    Lab Results  Component Value Date   CREATININE 0.70 06/12/2020   BUN 10 06/12/2020   NA 142 06/12/2020   K 3.9 06/12/2020   CL 101 06/12/2020   CO2 25 06/12/2020   Lab Results  Component Value Date   CHOL 241 (H) 06/12/2020   HDL 92 06/12/2020   LDLCALC 134 (H) 06/12/2020   TRIG 86 06/12/2020   CHOLHDL 2.6 06/12/2020   Lab Results  Component Value Date   TSH 1.240 06/12/2020   Lab Results  Component Value Date   HGBA1C 5.7 (H) 06/12/2019   Lab Results  Component Value Date   WBC 3.9 06/12/2020   HGB 14.3 06/12/2020   HCT 44.8 06/12/2020   MCV 92 06/12/2020   PLT 351 06/12/2020   Lab Results  Component Value Date   ALT 13 06/12/2020   AST 15  06/12/2020   ALKPHOS 49 06/12/2020   BILITOT 0.3 06/12/2020     Review of Systems  Constitutional: Negative.  Negative for chills, diaphoresis, fatigue, fever, malaise/fatigue and unexpected weight change.  HENT: Negative for congestion, ear discharge, ear pain, rhinorrhea, sinus pressure, sneezing and sore throat.   Eyes: Negative for double vision, photophobia, pain, discharge, redness and itching.  Respiratory: Negative for cough, shortness of breath, wheezing and stridor.   Cardiovascular: Positive for palpitations. Negative for chest pain, syncope and near-syncope.  Gastrointestinal: Negative for abdominal pain, blood in stool, constipation, diarrhea, nausea and vomiting.  Endocrine: Negative for cold intolerance, heat intolerance, polydipsia, polyphagia and polyuria.  Genitourinary: Negative for dysuria, flank pain, frequency, hematuria, menstrual problem, pelvic pain, urgency, vaginal bleeding and vaginal discharge.  Musculoskeletal: Negative for arthralgias, back pain and myalgias.  Skin: Negative for rash.  Allergic/Immunologic: Negative for environmental allergies and food allergies.  Neurological: Positive for numbness and headaches. Negative for dizziness, weakness and light-headedness.  Hematological: Negative for adenopathy. Does not bruise/bleed easily.  Psychiatric/Behavioral: Negative for dysphoric mood. The patient is not nervous/anxious.     Patient Active Problem List   Diagnosis Date Noted  . Age-related osteoporosis without current pathological fracture 06/12/2020  . Aortic atherosclerosis (HCC) 04/02/2020  . Arthritis of shoulder 11/30/2017  . Bigeminy 04/07/2016  . Benign esophageal stricture 01/21/2015  . Essential (primary) hypertension 01/21/2015  . Hammer toe 01/21/2015  . Lipoma of skin and subcutaneous tissue of trunk 01/21/2015    No  Known Allergies  Past Surgical History:  Procedure Laterality Date  . ABDOMINAL HYSTERECTOMY    . CATARACT  EXTRACTION W/PHACO Right 07/23/2020   Procedure: CATARACT EXTRACTION PHACO AND INTRAOCULAR LENS PLACEMENT (IOC) RIGHT 7.62 01:26.5 8.8%;  Surgeon: Leandrew Koyanagi, MD;  Location: [facility];  Service: Ophthalmology;  Laterality: Right;  . ESOPHAGOGASTRODUODENOSCOPY (EGD) WITH ESOPHAGEAL DILATION  10/2014   Dr. Allen Norris  . TOTAL VAGINAL HYSTERECTOMY  1993  . TUBAL LIGATION      Social History   Tobacco Use  . Smoking status: Never Smoker  . Smokeless tobacco: Never Used  . Tobacco comment: smoking cessation materials not required  Vaping Use  . Vaping Use: Never used  Substance Use Topics  . Alcohol use: Not Currently    Alcohol/week: 2.0 standard drinks    Types: 2 Cans of beer per week    Comment: last use 08/2019  . Drug use: No     Medication list has been reviewed and updated.  Current Meds  Medication Sig  . acetaminophen (TYLENOL) 500 MG tablet Take 500 mg by mouth every 6 (six) hours as needed for headache.  Marland Kitchen amLODipine (NORVASC) 10 MG tablet TAKE 1 TABLET BY MOUTH DAILY  . aspirin 81 MG tablet Take 81 mg by mouth daily.  . baclofen (LIORESAL) 10 MG tablet TAKE 1 TABLET(10 MG) BY MOUTH AT BEDTIME  . calcium-vitamin D (OSCAL WITH D) 500-200 MG-UNIT tablet Take 1 tablet by mouth. 600 mg daily  . metoprolol succinate (TOPROL-XL) 25 MG 24 hr tablet TAKE 2 TABLETS(50 MG) BY MOUTH DAILY  . Multiple Vitamins-Minerals (CENTRUM SILVER 50+WOMEN PO) Take 1 tablet by mouth daily.  Marland Kitchen olmesartan (BENICAR) 20 MG tablet Take 1 tablet (20 mg total) by mouth daily.  Marland Kitchen omeprazole (PRILOSEC) 20 MG capsule TAKE 1 CAPSULE(20 MG) BY MOUTH DAILY    PHQ 2/9 Scores 07/22/2020 07/15/2020 06/04/2020 04/02/2020  PHQ - 2 Score 0 0 1 0  PHQ- 9 Score 0 0 1 2    GAD 7 : Generalized Anxiety Score 07/22/2020 07/15/2020 04/02/2020  Nervous, Anxious, on Edge 1 0 0  Control/stop worrying 0 0 0  Worry too much - different things 0 0 0  Trouble relaxing 0 0 0  Restless 0 0 0  Easily annoyed or  irritable 0 0 0  Afraid - awful might happen 0 0 0  Total GAD 7 Score 1 0 0  Anxiety Difficulty Not difficult at all Not difficult at all Not difficult at all    BP Readings from Last 3 Encounters:  10/07/20 (!) 150/88  07/23/20 (!) 163/85  07/22/20 (!) 142/68    Physical Exam Vitals and nursing note reviewed.  Constitutional:      Appearance: She is well-developed and well-nourished.  HENT:     Head: Normocephalic.     Right Ear: Tympanic membrane, ear canal and external ear normal. There is no impacted cerumen.     Left Ear: Tympanic membrane, ear canal and external ear normal. There is no impacted cerumen.     Mouth/Throat:     Mouth: Oropharynx is clear and moist.  Eyes:     General: Lids are everted, no foreign bodies appreciated. No scleral icterus.       Left eye: No foreign body or hordeolum.     Extraocular Movements: EOM normal.     Conjunctiva/sclera: Conjunctivae normal.     Right eye: Right conjunctiva is not injected.     Left eye: Left conjunctiva is  not injected.     Pupils: Pupils are equal, round, and reactive to light.  Neck:     Thyroid: No thyromegaly.     Vascular: No JVD.     Trachea: No tracheal deviation.  Cardiovascular:     Rate and Rhythm: Normal rate and regular rhythm.     Pulses: Intact distal pulses.     Heart sounds: Normal heart sounds. No murmur heard. No friction rub. No gallop.   Pulmonary:     Effort: Pulmonary effort is normal. No respiratory distress.     Breath sounds: Normal breath sounds. No wheezing or rales.  Abdominal:     General: Bowel sounds are normal.     Palpations: Abdomen is soft. There is no hepatosplenomegaly or mass.     Tenderness: There is no abdominal tenderness. There is no guarding or rebound.  Musculoskeletal:        General: No tenderness or edema. Normal range of motion.     Cervical back: Normal range of motion and neck supple.  Lymphadenopathy:     Cervical: No cervical adenopathy.  Skin:     General: Skin is warm.     Findings: No rash.  Neurological:     Mental Status: She is alert and oriented to person, place, and time.     Cranial Nerves: No cranial nerve deficit.     Deep Tendon Reflexes: Strength normal. Reflexes normal.  Psychiatric:        Mood and Affect: Mood and affect normal. Mood is not anxious or depressed.     Wt Readings from Last 3 Encounters:  10/07/20 127 lb (57.6 kg)  07/23/20 126 lb (57.2 kg)  07/22/20 127 lb (57.6 kg)    BP (!) 150/88   Pulse 100   Ht 5\' 6"  (1.676 m)   Wt 127 lb (57.6 kg)   BMI 20.50 kg/m   Assessment and Plan:  1. Palpitations New onset.  Episodic.  Stable.  Patient's been having asymptomatic episodes of palpitations and was noted to have elevated heart rate at ophthalmology office.  We will obtain a renal function panel to look at electrolytes prior to seeing cardiology.  We will refer to cardiology for evaluations of her tachyarrhythmia and adjustment of medications for blood pressure and heart rate.- Ambulatory referral to Cardiology - Renal Function Panel  2. Tachycardia New onset.  Episodic.  It was noted that patient had elevated heart rate in ophthalmology office of 1 12-1 14 but was asymptomatic.  For that reason we will refer to cardiology for evaluation and treatment. - Ambulatory referral to Cardiology - Renal Function Panel  3. Essential (primary) hypertension Chronic.  Uncontrolled.  Stable.  Systolic pressure is elevated to 081 with a diastolic of 88 he was also noted to be elevated at ophthalmology office as well.  I am wondering if we decreased her amlodipine and may be put her on something like Coreg if it might be more of a control on her blood pressure as well as her tachyarrhythmia but we will leave this for consideration with cardiology. - Ambulatory referral to Cardiology - Renal Function Panel  Patient's upcoming cataract surgery has been postponed by Dr. Wallace Going which gives [place] plenty of time to  adjust her blood pressure and evaluate her tachyarrhythmia.

## 2020-10-08 ENCOUNTER — Encounter: Admission: RE | Payer: Self-pay | Source: Home / Self Care

## 2020-10-08 ENCOUNTER — Ambulatory Visit: Admission: RE | Admit: 2020-10-08 | Payer: Medicare Other | Source: Home / Self Care | Admitting: Ophthalmology

## 2020-10-08 LAB — RENAL FUNCTION PANEL
Albumin: 4.8 g/dL — ABNORMAL HIGH (ref 3.6–4.6)
BUN/Creatinine Ratio: 18 (ref 12–28)
BUN: 11 mg/dL (ref 8–27)
CO2: 24 mmol/L (ref 20–29)
Calcium: 9.9 mg/dL (ref 8.7–10.3)
Chloride: 103 mmol/L (ref 96–106)
Creatinine, Ser: 0.62 mg/dL (ref 0.57–1.00)
GFR calc Af Amer: 96 mL/min/{1.73_m2} (ref >59)
GFR calc non Af Amer: 84 mL/min/{1.73_m2} (ref >59)
Glucose: 101 mg/dL — ABNORMAL HIGH (ref 65–99)
Phosphorus: 3.8 mg/dL (ref 3.0–4.3)
Potassium: 4.1 mmol/L (ref 3.5–5.2)
Sodium: 143 mmol/L (ref 134–144)

## 2020-10-08 SURGERY — PHACOEMULSIFICATION, CATARACT, WITH IOL INSERTION
Anesthesia: Topical | Laterality: Left

## 2020-10-15 ENCOUNTER — Encounter: Payer: Self-pay | Admitting: Internal Medicine

## 2020-10-15 ENCOUNTER — Ambulatory Visit (INDEPENDENT_AMBULATORY_CARE_PROVIDER_SITE_OTHER): Payer: Medicare Other | Admitting: Internal Medicine

## 2020-10-15 ENCOUNTER — Other Ambulatory Visit: Payer: Self-pay

## 2020-10-15 VITALS — BP 148/76 | HR 110 | Ht 66.0 in | Wt 123.0 lb

## 2020-10-15 DIAGNOSIS — I10 Essential (primary) hypertension: Secondary | ICD-10-CM | POA: Diagnosis not present

## 2020-10-15 DIAGNOSIS — I700 Atherosclerosis of aorta: Secondary | ICD-10-CM

## 2020-10-15 NOTE — Patient Instructions (Signed)
Take 2 Olmesartan 20 mg pills daily.  Be sure to let the cardiologist know that you have made that change when you see him next week.

## 2020-10-15 NOTE — Progress Notes (Signed)
Date:  10/15/2020   Name:  Kendra Lane   DOB:  10/05/1934   MRN:  [id number]   Chief Complaint: Hypertension  Hypertension This is a chronic problem. The problem is resistant. Associated symptoms include palpitations. Pertinent negatives include no chest pain, headaches or shortness of breath. Past treatments include beta blockers, calcium channel blockers and angiotensin blockers. The current treatment provides moderate improvement.  She needs to have eye surgery but her BP has been too high.   She has an appointment next week with Cardiology for further management.  Lab Results  Component Value Date   CREATININE 0.62 10/07/2020   BUN 11 10/07/2020   NA 143 10/07/2020   K 4.1 10/07/2020   CL 103 10/07/2020   CO2 24 10/07/2020   Lab Results  Component Value Date   CHOL 241 (H) 06/12/2020   HDL 92 06/12/2020   LDLCALC 134 (H) 06/12/2020   TRIG 86 06/12/2020   CHOLHDL 2.6 06/12/2020   Lab Results  Component Value Date   TSH 1.240 06/12/2020   Lab Results  Component Value Date   HGBA1C 5.7 (H) 06/12/2019   Lab Results  Component Value Date   WBC 3.9 06/12/2020   HGB 14.3 06/12/2020   HCT 44.8 06/12/2020   MCV 92 06/12/2020   PLT 351 06/12/2020   Lab Results  Component Value Date   ALT 13 06/12/2020   AST 15 06/12/2020   ALKPHOS 49 06/12/2020   BILITOT 0.3 06/12/2020     Review of Systems  Constitutional: Negative for chills, fatigue and fever.  HENT: Negative for trouble swallowing.   Eyes: Positive for visual disturbance.  Respiratory: Negative for chest tightness, shortness of breath and wheezing.   Cardiovascular: Positive for palpitations. Negative for chest pain and leg swelling.  Musculoskeletal: Positive for arthralgias.  Neurological: Negative for dizziness, syncope and headaches.    Patient Active Problem List   Diagnosis Date Noted  . Age-related osteoporosis without current pathological fracture 06/12/2020  . Aortic atherosclerosis  ([place]) 04/02/2020  . Arthritis of shoulder 11/30/2017  . Bigeminy 04/07/2016  . Benign esophageal stricture 01/21/2015  . Essential (primary) hypertension 01/21/2015  . Hammer toe 01/21/2015  . Lipoma of skin and subcutaneous tissue of trunk 01/21/2015    No Known Allergies  Past Surgical History:  Procedure Laterality Date  . ABDOMINAL HYSTERECTOMY    . CATARACT EXTRACTION W/PHACO Right 07/23/2020   Procedure: CATARACT EXTRACTION PHACO AND INTRAOCULAR LENS PLACEMENT (IOC) RIGHT 7.62 01:26.5 8.8%;  Surgeon: Leandrew Koyanagi, MD;  Location: [facility];  Service: Ophthalmology;  Laterality: Right;  . ESOPHAGOGASTRODUODENOSCOPY (EGD) WITH ESOPHAGEAL DILATION  10/2014   Dr. Allen Norris  . TOTAL VAGINAL HYSTERECTOMY  1993  . TUBAL LIGATION      Social History   Tobacco Use  . Smoking status: Never Smoker  . Smokeless tobacco: Never Used  . Tobacco comment: smoking cessation materials not required  Vaping Use  . Vaping Use: Never used  Substance Use Topics  . Alcohol use: Not Currently    Alcohol/week: 2.0 standard drinks    Types: 2 Cans of beer per week    Comment: last use 08/2019  . Drug use: No     Medication list has been reviewed and updated.  Current Meds  Medication Sig  . acetaminophen (TYLENOL) 500 MG tablet Take 500 mg by mouth every 6 (six) hours as needed for headache.  Marland Kitchen amLODipine (NORVASC) 10 MG tablet TAKE 1 TABLET BY MOUTH DAILY  .  aspirin 81 MG tablet Take 81 mg by mouth daily.  . baclofen (LIORESAL) 10 MG tablet TAKE 1 TABLET(10 MG) BY MOUTH AT BEDTIME  . calcium-vitamin D (OSCAL WITH D) 500-200 MG-UNIT tablet Take 1 tablet by mouth. 600 mg daily  . metoprolol succinate (TOPROL-XL) 25 MG 24 hr tablet TAKE 2 TABLETS(50 MG) BY MOUTH DAILY  . Multiple Vitamins-Minerals (CENTRUM SILVER 50+WOMEN PO) Take 1 tablet by mouth daily.  Marland Kitchen olmesartan (BENICAR) 20 MG tablet Take 1 tablet (20 mg total) by mouth daily.  Marland Kitchen omeprazole (PRILOSEC) 20 MG capsule TAKE  1 CAPSULE(20 MG) BY MOUTH DAILY    PHQ 2/9 Scores 10/15/2020 07/22/2020 07/15/2020 06/04/2020  PHQ - 2 Score 4 0 0 1  PHQ- 9 Score 6 0 0 1    GAD 7 : Generalized Anxiety Score 10/15/2020 07/22/2020 07/15/2020 04/02/2020  Nervous, Anxious, on Edge 1 1 0 0  Control/stop worrying 3 0 0 0  Worry too much - different things 3 0 0 0  Trouble relaxing 0 0 0 0  Restless 0 0 0 0  Easily annoyed or irritable 1 0 0 0  Afraid - awful might happen 0 0 0 0  Total GAD 7 Score 8 1 0 0  Anxiety Difficulty Not difficult at all Not difficult at all Not difficult at all Not difficult at all    BP Readings from Last 3 Encounters:  10/15/20 (!) 148/76  10/07/20 (!) 150/88  07/23/20 (!) 163/85    Physical Exam Vitals and nursing note reviewed.  Constitutional:      General: She is not in acute distress.    Appearance: She is well-developed.  HENT:     Head: Normocephalic and atraumatic.  Cardiovascular:     Rate and Rhythm: Regular rhythm. Tachycardia present.     Pulses: Normal pulses.     Heart sounds: No murmur heard.   Pulmonary:     Effort: Pulmonary effort is normal. No respiratory distress.     Breath sounds: No wheezing or rhonchi.  Musculoskeletal:     Right lower leg: No edema.     Left lower leg: No edema.  Skin:    General: Skin is warm and dry.     Findings: No rash.  Neurological:     General: No focal deficit present.     Mental Status: She is alert and oriented to person, place, and time.  Psychiatric:        Mood and Affect: Mood and affect and mood normal.        Behavior: Behavior normal.     Wt Readings from Last 3 Encounters:  10/15/20 123 lb (55.8 kg)  10/07/20 127 lb (57.6 kg)  07/23/20 126 lb (57.2 kg)    BP (!) 148/76   Pulse (!) 110   Ht 5\' 6"  (1.676 m)   Wt 123 lb (55.8 kg)   SpO2 98%   BMI 19.85 kg/m   Assessment and Plan: 1. Essential (primary) hypertension BP slightly elevated - improved over previous Will double up on Olmesartan to 40 mg per day  until seen by Cardiology for tachycardia Should be able to proceed with cataract surgery in the near future   Partially dictated using [place]. Any errors are unintentional.  Halina Maidens, MD [facility]  10/15/2020

## 2020-10-22 DIAGNOSIS — I208 Other forms of angina pectoris: Secondary | ICD-10-CM | POA: Diagnosis not present

## 2020-10-22 DIAGNOSIS — R002 Palpitations: Secondary | ICD-10-CM | POA: Diagnosis not present

## 2020-10-22 DIAGNOSIS — R0789 Other chest pain: Secondary | ICD-10-CM | POA: Diagnosis not present

## 2020-10-22 DIAGNOSIS — I10 Essential (primary) hypertension: Secondary | ICD-10-CM | POA: Diagnosis not present

## 2020-10-22 DIAGNOSIS — R9431 Abnormal electrocardiogram [ECG] [EKG]: Secondary | ICD-10-CM | POA: Diagnosis not present

## 2020-10-22 DIAGNOSIS — R000 Tachycardia, unspecified: Secondary | ICD-10-CM | POA: Diagnosis not present

## 2020-10-22 DIAGNOSIS — I700 Atherosclerosis of aorta: Secondary | ICD-10-CM | POA: Diagnosis not present

## 2020-10-22 DIAGNOSIS — R0600 Dyspnea, unspecified: Secondary | ICD-10-CM | POA: Diagnosis not present

## 2020-11-05 DIAGNOSIS — I10 Essential (primary) hypertension: Secondary | ICD-10-CM | POA: Diagnosis not present

## 2020-11-24 DIAGNOSIS — M79675 Pain in left toe(s): Secondary | ICD-10-CM | POA: Diagnosis not present

## 2020-11-24 DIAGNOSIS — M2041 Other hammer toe(s) (acquired), right foot: Secondary | ICD-10-CM | POA: Diagnosis not present

## 2020-11-24 DIAGNOSIS — B351 Tinea unguium: Secondary | ICD-10-CM | POA: Diagnosis not present

## 2020-11-24 DIAGNOSIS — M79674 Pain in right toe(s): Secondary | ICD-10-CM | POA: Diagnosis not present

## 2020-11-28 DIAGNOSIS — R0600 Dyspnea, unspecified: Secondary | ICD-10-CM | POA: Diagnosis not present

## 2020-11-28 DIAGNOSIS — R002 Palpitations: Secondary | ICD-10-CM | POA: Diagnosis not present

## 2020-11-28 DIAGNOSIS — R9431 Abnormal electrocardiogram [ECG] [EKG]: Secondary | ICD-10-CM | POA: Diagnosis not present

## 2020-11-28 DIAGNOSIS — R0789 Other chest pain: Secondary | ICD-10-CM | POA: Diagnosis not present

## 2020-11-28 DIAGNOSIS — I208 Other forms of angina pectoris: Secondary | ICD-10-CM | POA: Diagnosis not present

## 2020-11-28 DIAGNOSIS — R000 Tachycardia, unspecified: Secondary | ICD-10-CM | POA: Diagnosis not present

## 2020-12-01 ENCOUNTER — Other Ambulatory Visit: Payer: Self-pay | Admitting: Internal Medicine

## 2020-12-01 DIAGNOSIS — I10 Essential (primary) hypertension: Secondary | ICD-10-CM

## 2020-12-03 DIAGNOSIS — R002 Palpitations: Secondary | ICD-10-CM | POA: Diagnosis not present

## 2020-12-03 DIAGNOSIS — I10 Essential (primary) hypertension: Secondary | ICD-10-CM | POA: Diagnosis not present

## 2020-12-03 DIAGNOSIS — R0789 Other chest pain: Secondary | ICD-10-CM | POA: Diagnosis not present

## 2020-12-03 DIAGNOSIS — R000 Tachycardia, unspecified: Secondary | ICD-10-CM | POA: Diagnosis not present

## 2020-12-03 DIAGNOSIS — I700 Atherosclerosis of aorta: Secondary | ICD-10-CM | POA: Diagnosis not present

## 2020-12-04 ENCOUNTER — Other Ambulatory Visit: Payer: Self-pay | Admitting: Internal Medicine

## 2020-12-04 DIAGNOSIS — I10 Essential (primary) hypertension: Secondary | ICD-10-CM

## 2020-12-04 NOTE — Telephone Encounter (Signed)
Requested Prescriptions  Pending Prescriptions Disp Refills  . olmesartan (BENICAR) 20 MG tablet [Pharmacy Med Name: OLMESARTAN MEDOXOMIL 20MG  TABLETS] 90 tablet 1    Sig: TAKE 1 TABLET(20 MG) BY MOUTH DAILY     Cardiovascular:  Angiotensin Receptor Blockers Failed - 12/04/2020  3:34 AM      Failed - Last BP in normal range    BP Readings from Last 1 Encounters:  10/15/20 (!) 148/76         Passed - Cr in normal range and within 180 days    Creatinine  Date Value Ref Range Status  01/22/2014 0.60 0.60 - 1.30 mg/dL Final   Creatinine, Ser  Date Value Ref Range Status  10/07/2020 0.62 0.57 - 1.00 mg/dL Final         Passed - K in normal range and within 180 days    Potassium  Date Value Ref Range Status  10/07/2020 4.1 3.5 - 5.2 mmol/L Final  01/22/2014 3.8 3.5 - 5.1 mmol/L Final         Passed - Patient is not pregnant      Passed - Valid encounter within last 6 months    Recent Outpatient Visits          1 month ago Essential (primary) hypertension   [facility] Glean Hess, MD   1 month ago Palpitations   [place], MD   4 months ago Episodic tension-type headache, not intractable   [facility] Glean Hess, MD   4 months ago Essential (primary) hypertension   [facility] Glean Hess, MD   5 months ago Annual physical exam   [facility] Glean Hess, MD      Future Appointments            In 3 weeks [employer] Mel[facility] Jesse Sans, MD [facility] [place]   In 6 months [employer] Melia, Jesse Sans, MD [facility], [facility]

## 2020-12-25 ENCOUNTER — Telehealth: Payer: Self-pay

## 2020-12-25 ENCOUNTER — Ambulatory Visit
Admission: RE | Admit: 2020-12-25 | Discharge: 2020-12-25 | Disposition: A | Discharge status: Home / Self Care | Payer: Medicare Other | Source: Ambulatory Visit | Attending: Internal Medicine | Admitting: Internal Medicine

## 2020-12-25 ENCOUNTER — Other Ambulatory Visit: Payer: Self-pay

## 2020-12-25 DIAGNOSIS — Z1231 Encounter for screening mammogram for malignant neoplasm of breast: Secondary | ICD-10-CM | POA: Diagnosis not present

## 2020-12-25 NOTE — Telephone Encounter (Signed)
Copied from [place] [phone]. Topic: General - Other >> Dec 25, 2020 12:37 PM Tessa Lerner A wrote: Reason for CRM: Patient has returned missed call to practice  Agent saw no open notes or encounters at the time of returned call  Please contact again when possible

## 2020-12-25 NOTE — Telephone Encounter (Signed)
Called pt let her know that I viewed her chart and did not see any notes where anyone has called. Pt verbalized understanding.  KP

## 2020-12-29 ENCOUNTER — Ambulatory Visit: Payer: Medicare Other | Admitting: Internal Medicine

## 2020-12-31 ENCOUNTER — Ambulatory Visit
Admission: EM | Admit: 2020-12-31 | Discharge: 2020-12-31 | Disposition: A | Discharge status: Home / Self Care | Payer: Medicare Other | Attending: Family Medicine | Admitting: Family Medicine

## 2020-12-31 ENCOUNTER — Encounter: Payer: Self-pay | Admitting: Emergency Medicine

## 2020-12-31 ENCOUNTER — Other Ambulatory Visit: Payer: Self-pay

## 2020-12-31 DIAGNOSIS — M25511 Pain in right shoulder: Secondary | ICD-10-CM | POA: Diagnosis not present

## 2020-12-31 DIAGNOSIS — G8929 Other chronic pain: Secondary | ICD-10-CM

## 2020-12-31 DIAGNOSIS — M12811 Other specific arthropathies, not elsewhere classified, right shoulder: Secondary | ICD-10-CM

## 2020-12-31 DIAGNOSIS — M25512 Pain in left shoulder: Secondary | ICD-10-CM | POA: Diagnosis not present

## 2020-12-31 MED ORDER — PREDNISONE 10 MG (21) PO TBPK: ORAL_TABLET | ORAL | 0 refills | Status: DC

## 2020-12-31 NOTE — ED Provider Notes (Signed)
[facility]    CSN: [id number] Arrival date & time: 12/31/20  0930      History   Chief Complaint Chief Complaint  Patient presents with  . Shoulder Pain    right   HPI  86 year old female presents with chronic shoulder pain.  Patient reports bilateral shoulder pain.  She has been seen here for this previously.  She reports that her right shoulder has been giving her more trouble over the past 5 months.  She has not seen her PCP for this.  She has not seen an orthopedist for this.  Denies fall, trauma, injury.  She has been taking Tylenol, BC powder, and some previous medication without resolution.  She does note some decreased range of motion.  No other associated symptoms.  No other complaints.   Past Medical History:  Diagnosis Date  . Dysrhythmia    hx of, ok now  . GERD (gastroesophageal reflux disease)   . Headache    twice a day sometimes  . Hypertension   . Neck pain   . Shoulder pain, left     Patient Active Problem List   Diagnosis Date Noted  . Age-related osteoporosis without current pathological fracture 06/12/2020  . Aortic atherosclerosis ([place]) 04/02/2020  . Arthritis of shoulder 11/30/2017  . Bigeminy 04/07/2016  . Benign esophageal stricture 01/21/2015  . Essential (primary) hypertension 01/21/2015  . Hammer toe 01/21/2015  . Lipoma of skin and subcutaneous tissue of trunk 01/21/2015    Past Surgical History:  Procedure Laterality Date  . ABDOMINAL HYSTERECTOMY    . CATARACT EXTRACTION W/PHACO Right 07/23/2020   Procedure: CATARACT EXTRACTION PHACO AND INTRAOCULAR LENS PLACEMENT (IOC) RIGHT 7.62 01:26.5 8.8%;  Surgeon: Leandrew Koyanagi, MD;  Location: [place];  Service: Ophthalmology;  Laterality: Right;  . ESOPHAGOGASTRODUODENOSCOPY (EGD) WITH ESOPHAGEAL DILATION  10/2014   Dr. Allen Norris  . TOTAL VAGINAL HYSTERECTOMY  1993  . TUBAL LIGATION      OB History   No obstetric history on file.      Home Medications     Prior to Admission medications   Medication Sig Start Date End Date Taking? Authorizing Provider  acetaminophen (TYLENOL) 500 MG tablet Take 500 mg by mouth every 6 (six) hours as needed for headache.   Yes [provider]  amLODipine (NORVASC) 10 MG tablet TAKE 1 TABLET BY MOUTH DAILY 12/01/20  Yes Glean Hess, MD  aspirin 81 MG tablet Take 81 mg by mouth daily.   Yes [provider]  baclofen (LIORESAL) 10 MG tablet TAKE 1 TABLET(10 MG) BY MOUTH AT BEDTIME 07/08/20  Yes Glean Hess, MD  calcium-vitamin D (OSCAL WITH D) 500-200 MG-UNIT tablet Take 1 tablet by mouth. 600 mg daily   Yes [provider]  metoprolol succinate (TOPROL-XL) 25 MG 24 hr tablet TAKE 2 TABLETS(50 MG) BY MOUTH DAILY 11/01/19  Yes Glean Hess, MD  Multiple Vitamins-Minerals (CENTRUM SILVER 50+WOMEN PO) Take 1 tablet by mouth daily.   Yes [provider]  olmesartan (BENICAR) 20 MG tablet TAKE 1 TABLET(20 MG) BY MOUTH DAILY 12/04/20  Yes Glean Hess, MD  omeprazole (PRILOSEC) 20 MG capsule TAKE 1 CAPSULE(20 MG) BY MOUTH DAILY 06/04/20  Yes Glean Hess, MD  predniSONE (STERAPRED UNI-PAK 21 TAB) 10 MG (21) TBPK tablet 6 tablets on day 1; decrease by 1 tablet daily until gone. 12/31/20  Yes Coral Spikes, DO    Family History Family History  Problem Relation Age of  Onset  . Hypertension Mother   . Other Father        "old age"  . Breast cancer Daughter 16    Social History Social History   Tobacco Use  . Smoking status: Never Smoker  . Smokeless tobacco: Never Used  . Tobacco comment: smoking cessation materials not required  Vaping Use  . Vaping Use: Never used  Substance Use Topics  . Alcohol use: Not Currently    Alcohol/week: 2.0 standard drinks    Types: 2 Cans of beer per week    Comment: last use 08/2019  . Drug use: No     Allergies   Patient has no known allergies.   Review of Systems Review of Systems  Constitutional: Negative.    Musculoskeletal:       Shoulder pain.   Physical Exam Triage Vital Signs ED Triage Vitals  Enc Vitals Group     BP 12/31/20 0955 (!) 161/89     Pulse Rate 12/31/20 0955 75     Resp 12/31/20 0955 18     Temp 12/31/20 0955 98.2 F (36.8 C)     Temp Source 12/31/20 0955 Oral     SpO2 12/31/20 0955 100 %     Weight 12/31/20 0956 127 lb (57.6 kg)     Height 12/31/20 0956 5\' 6"  (1.676 m)     Head Circumference --      Peak Flow --      Pain Score 12/31/20 0955 5     Pain Loc --      Pain Edu? --      Excl. in [place]? --    Updated Vital Signs BP (!) 161/89 (BP Location: Left Arm)   Pulse 75   Temp 98.2 F (36.8 C) (Oral)   Resp 18   Ht 5\' 6"  (1.676 m)   Wt 57.6 kg   SpO2 100%   BMI 20.50 kg/m   Visual Acuity Right Eye Distance:   Left Eye Distance:   Bilateral Distance:    Right Eye Near:   Left Eye Near:    Bilateral Near:     Physical Exam Vitals and nursing note reviewed.  Constitutional:      General: She is not in acute distress.    Appearance: Normal appearance. She is not ill-appearing.  Eyes:     General:        Right eye: No discharge.        Left eye: No discharge.     Conjunctiva/sclera: Conjunctivae normal.  Cardiovascular:     Rate and Rhythm: Normal rate and regular rhythm.  Pulmonary:     Effort: Pulmonary effort is normal. No respiratory distress.  Musculoskeletal:     Comments: Right shoulder -positive empty can.  Positive Hawking's.  Slightly decreased muscle strength of the supraspinatus/infraspinatus.  Neurological:     Mental Status: She is alert.  Psychiatric:        Mood and Affect: Mood normal.        Behavior: Behavior normal.     UC Treatments / Results  Labs (all labs ordered are listed, but only abnormal results are displayed) Labs Reviewed - No data to display  EKG   Radiology No results found.  Procedures Procedures (including critical care time)  Medications Ordered in UC Medications - No data to  display  Initial Impression / Assessment and Plan / UC Course  I have reviewed the triage vital signs and the nursing notes.  Pertinent labs &  imaging results that were available during my care of the patient were reviewed by me and considered in my medical decision making (see chart for details).    86 year old female presents with chronic shoulder pain.  This is likely secondary to rotator cuff arthropathy.  Placing on prednisone.  Advised to follow-up with orthopedics.  Final Clinical Impressions(s) / UC Diagnoses   Final diagnoses:  Chronic pain of both shoulders  Rotator cuff arthropathy of right shoulder     Discharge Instructions     Medication as prescribed.  Please call [place] [phone]) OR [facility] [phone]) for an appt.  Take care  Dr. Lacinda Axon    ED Prescriptions    Medication Sig Dispense Auth. Provider   predniSONE (STERAPRED UNI-PAK 21 TAB) 10 MG (21) TBPK tablet 6 tablets on day 1; decrease by 1 tablet daily until gone. 21 tablet Thersa Salt G, DO     PDMP not reviewed this encounter.   Coral Spikes, [place] 12/31/20 1206

## 2020-12-31 NOTE — ED Triage Notes (Signed)
Patient in today c/o right shoulder pain x 5 months, worse in the last 3 weeks. Patient denies any specific injury. Patient has taken OTC Tylenol, BC Powders  and "pain pills" when she had them previously.

## 2020-12-31 NOTE — Discharge Instructions (Signed)
Medication as prescribed.  Please call [place] [phone]) OR [facility] [phone]) for an appt.  Take care  Dr. Lacinda Axon

## 2021-03-01 ENCOUNTER — Other Ambulatory Visit: Payer: Self-pay | Admitting: Internal Medicine

## 2021-03-01 DIAGNOSIS — I10 Essential (primary) hypertension: Secondary | ICD-10-CM

## 2021-03-01 NOTE — Telephone Encounter (Signed)
Requested Prescriptions  Pending Prescriptions Disp Refills  . amLODipine (NORVASC) 10 MG tablet [Pharmacy Med Name: AMLODIPINE BESYLATE 10MG  TABLETS] 90 tablet 0    Sig: TAKE 1 TABLET BY MOUTH DAILY     Cardiovascular:  Calcium Channel Blockers Failed - 03/01/2021  6:24 AM      Failed - Last BP in normal range    BP Readings from Last 1 Encounters:  12/31/20 (!) 161/89         Passed - Valid encounter within last 6 months    Recent Outpatient Visits          4 months ago Essential (primary) hypertension   [facility] Glean Hess, MD   4 months ago Palpitations   [facility] Juline Patch, MD   7 months ago Episodic tension-type headache, not intractable   [facility] Glean Hess, MD   7 months ago Essential (primary) hypertension   [facility] Glean Hess, MD   8 months ago Annual physical exam   [facility] Glean Hess, MD      Future Appointments            In 3 months Army Melia Jesse Sans, MD [facility], [facility]

## 2021-03-10 DIAGNOSIS — H2512 Age-related nuclear cataract, left eye: Secondary | ICD-10-CM | POA: Diagnosis not present

## 2021-03-10 DIAGNOSIS — H40003 Preglaucoma, unspecified, bilateral: Secondary | ICD-10-CM | POA: Diagnosis not present

## 2021-03-23 ENCOUNTER — Encounter: Payer: Self-pay | Admitting: Ophthalmology

## 2021-03-25 ENCOUNTER — Other Ambulatory Visit: Payer: Self-pay | Admitting: Internal Medicine

## 2021-03-25 DIAGNOSIS — M542 Cervicalgia: Secondary | ICD-10-CM

## 2021-03-25 NOTE — Telephone Encounter (Signed)
Requested medication (s) are due for refill today: no  Requested medication (s) are on the active medication list: yes   Last refill:  12/01/2020  Future visit scheduled: yes  Notes to clinic:  this refill cannot be delegated    Requested Prescriptions  Pending Prescriptions Disp Refills   baclofen (LIORESAL) 10 MG tablet [Pharmacy Med Name: BACLOFEN 10MG  TABLETS] 30 tablet 1    Sig: TAKE 1 TABLET(10 MG) BY MOUTH AT BEDTIME      Not Delegated - Analgesics:  Muscle Relaxants Failed - 03/25/2021 10:10 AM      Failed - This refill cannot be delegated      Passed - Valid encounter within last 6 months    Recent Outpatient Visits           5 months ago Essential (primary) hypertension   [facility] Glean Hess, MD   5 months ago Palpitations   [facility] Juline Patch, MD   8 months ago Episodic tension-type headache, not intractable   [facility] Glean Hess, MD   8 months ago Essential (primary) hypertension   [facility] Glean Hess, MD   9 months ago Annual physical exam   [facility] Glean Hess, MD       Future Appointments             In 2 months Army Melia Jesse Sans, MD [facility], [facility]

## 2021-03-31 NOTE — Discharge Instructions (Signed)

## 2021-04-01 ENCOUNTER — Ambulatory Visit: Payer: Medicare Other | Admitting: Anesthesiology

## 2021-04-01 ENCOUNTER — Encounter
Admission: RE | Disposition: A | Discharge status: Home / Self Care | Payer: Self-pay | Source: Home / Self Care | Attending: Ophthalmology

## 2021-04-01 ENCOUNTER — Other Ambulatory Visit: Payer: Self-pay

## 2021-04-01 ENCOUNTER — Encounter: Payer: Self-pay | Admitting: Ophthalmology

## 2021-04-01 ENCOUNTER — Ambulatory Visit
Admission: RE | Admit: 2021-04-01 | Discharge: 2021-04-01 | Disposition: A | Discharge status: Home / Self Care | Payer: Medicare Other | Attending: Ophthalmology | Admitting: Ophthalmology

## 2021-04-01 DIAGNOSIS — Z7952 Long term (current) use of systemic steroids: Secondary | ICD-10-CM | POA: Insufficient documentation

## 2021-04-01 DIAGNOSIS — Z8249 Family history of ischemic heart disease and other diseases of the circulatory system: Secondary | ICD-10-CM | POA: Insufficient documentation

## 2021-04-01 DIAGNOSIS — Z79899 Other long term (current) drug therapy: Secondary | ICD-10-CM | POA: Insufficient documentation

## 2021-04-01 DIAGNOSIS — Z803 Family history of malignant neoplasm of breast: Secondary | ICD-10-CM | POA: Diagnosis not present

## 2021-04-01 DIAGNOSIS — Z7982 Long term (current) use of aspirin: Secondary | ICD-10-CM | POA: Insufficient documentation

## 2021-04-01 DIAGNOSIS — H2512 Age-related nuclear cataract, left eye: Secondary | ICD-10-CM | POA: Insufficient documentation

## 2021-04-01 DIAGNOSIS — I10 Essential (primary) hypertension: Secondary | ICD-10-CM | POA: Diagnosis not present

## 2021-04-01 DIAGNOSIS — H25812 Combined forms of age-related cataract, left eye: Secondary | ICD-10-CM | POA: Diagnosis not present

## 2021-04-01 HISTORY — PX: CATARACT EXTRACTION W/PHACO: SHX586

## 2021-04-01 SURGERY — PHACOEMULSIFICATION, CATARACT, WITH IOL INSERTION
Anesthesia: Monitor Anesthesia Care | Site: Eye | Laterality: Left

## 2021-04-01 MED ORDER — SIGHTPATH DOSE#1 BSS IO SOLN
INTRAOCULAR | Status: DC | PRN
  Administered 2021-04-01: 67 mL via OPHTHALMIC

## 2021-04-01 MED ORDER — SIGHTPATH DOSE#1 BSS IO SOLN
INTRAOCULAR | Status: DC | PRN
  Administered 2021-04-01: 15 mL via INTRAOCULAR

## 2021-04-01 MED ORDER — CEFUROXIME OPHTHALMIC INJECTION 1 MG/0.1 ML
INJECTION | OPHTHALMIC | Status: DC | PRN
  Administered 2021-04-01: 0.1 mL via INTRACAMERAL

## 2021-04-01 MED ORDER — LACTATED RINGERS IV SOLN: INTRAVENOUS | Status: DC

## 2021-04-01 MED ORDER — TETRACAINE HCL 0.5 % OP SOLN
1.0000 [drp] | OPHTHALMIC | Status: AC | PRN
  Administered 2021-04-01 (×3): 1 [drp] via OPHTHALMIC

## 2021-04-01 MED ORDER — SIGHTPATH DOSE#1 BSS IO SOLN
INTRAOCULAR | Status: DC | PRN
  Administered 2021-04-01: 2 mL

## 2021-04-01 MED ORDER — PHENYLEPHRINE HCL 10 % OP SOLN
1.0000 [drp] | OPHTHALMIC | Status: AC
  Administered 2021-04-01 (×3): 1 [drp] via OPHTHALMIC

## 2021-04-01 MED ORDER — CYCLOPENTOLATE HCL 2 % OP SOLN
1.0000 [drp] | OPHTHALMIC | Status: AC
  Administered 2021-04-01 (×3): 1 [drp] via OPHTHALMIC

## 2021-04-01 MED ORDER — FENTANYL CITRATE (PF) 100 MCG/2ML IJ SOLN
INTRAMUSCULAR | Status: DC | PRN
  Administered 2021-04-01: 50 ug via INTRAVENOUS

## 2021-04-01 MED ORDER — MIDAZOLAM HCL 2 MG/2ML IJ SOLN
INTRAMUSCULAR | Status: DC | PRN
  Administered 2021-04-01: 1 mg via INTRAVENOUS

## 2021-04-01 MED ORDER — SIGHTPATH DOSE#1 NA HYALUR & NA CHOND-NA HYALUR IO KIT
PACK | INTRAOCULAR | Status: DC | PRN
  Administered 2021-04-01: 1 via OPHTHALMIC

## 2021-04-01 MED ORDER — BRIMONIDINE TARTRATE-TIMOLOL 0.2-0.5 % OP SOLN
OPHTHALMIC | Status: DC | PRN
  Administered 2021-04-01: 1 [drp] via OPHTHALMIC

## 2021-04-01 SURGICAL SUPPLY — 15 items
CANNULA ANT/CHMB 27GA (MISCELLANEOUS) ×2 IMPLANT
GLOVE SURG ENC TEXT LTX SZ7.5 (GLOVE) ×2 IMPLANT
GLOVE SURG TRIUMPH 8.0 PF LTX (GLOVE) ×2 IMPLANT
GOWN STRL REUS W/ TWL LRG LVL3 (GOWN DISPOSABLE) ×2 IMPLANT
GOWN STRL REUS W/TWL LRG LVL3 (GOWN DISPOSABLE) ×4
LENS IOL TECNIS EYHANCE 21.5 (Intraocular Lens) ×2 IMPLANT
MARKER SKIN DUAL TIP RULER LAB (MISCELLANEOUS) ×2 IMPLANT
NEEDLE CAPSULORHEX 25GA (NEEDLE) ×2 IMPLANT
NEEDLE FILTER BLUNT 18X 1/2SAF (NEEDLE) ×2
NEEDLE FILTER BLUNT 18X1 1/2 (NEEDLE) ×2 IMPLANT
PACK EYE AFTER SURG (MISCELLANEOUS) ×2 IMPLANT
SYR 3ML LL SCALE MARK (SYRINGE) ×4 IMPLANT
SYR TB 1ML LUER SLIP (SYRINGE) ×2 IMPLANT
WATER STERILE IRR 250ML POUR (IV SOLUTION) ×2 IMPLANT
WIPE NON LINTING 3.25X3.25 (MISCELLANEOUS) ×2 IMPLANT

## 2021-04-01 NOTE — Anesthesia Preprocedure Evaluation (Signed)
Anesthesia Evaluation  Patient identified by MRN, date of birth, ID band Patient awake    History of Anesthesia Complications Negative for: history of anesthetic complications  Airway Mallampati: I  TM Distance: >3 FB Neck ROM: Full    Dental no notable dental hx.    Pulmonary neg pulmonary ROS,    Pulmonary exam normal        Cardiovascular Exercise Tolerance: Good hypertension, Pt. on medications and Pt. on home beta blockers Normal cardiovascular exam  11/2020 stress echo INTERPRETATION  Normal Stress Echocardiogram  NORMAL RIGHT VENTRICULAR SYSTOLIC FUNCTION  TRIVIAL REGURGITATION NOTED (See above)  NO VALVULAR STENOSIS NOTED     Neuro/Psych negative neurological ROS     GI/Hepatic Neg liver ROS, GERD  ,  Endo/Other  negative endocrine ROS  Renal/GU negative Renal ROS     Musculoskeletal   Abdominal   Peds  Hematology negative hematology ROS (+)   Anesthesia Other Findings   Reproductive/Obstetrics                             Anesthesia Physical Anesthesia Plan  ASA: 2  Anesthesia Plan: MAC   Post-op Pain Management:    Induction: Intravenous  PONV Risk Score and Plan: 2 and Midazolam, Scopolamine patch - Pre-op and Treatment may vary due to age or medical condition  Airway Management Planned: Nasal Cannula and Natural Airway  Additional Equipment: None  Intra-op Plan:   Post-operative Plan:   Informed Consent: I have reviewed the patients History and Physical, chart, labs and discussed the procedure including the risks, benefits and alternatives for the proposed anesthesia with the patient or authorized representative who has indicated his/her understanding and acceptance.       Plan Discussed with: CRNA  Anesthesia Plan Comments:         Anesthesia Quick Evaluation

## 2021-04-01 NOTE — Anesthesia Postprocedure Evaluation (Signed)
Anesthesia Post Note  Patient: Kendra Lane  Procedure(s) Performed: CATARACT EXTRACTION PHACO AND INTRAOCULAR LENS PLACEMENT (IOC) LEFT 3.64 00:56.9 (Left: Eye)     Patient location during evaluation: PACU Anesthesia Type: MAC Level of consciousness: awake and alert Pain management: pain level controlled Vital Signs Assessment: post-procedure vital signs reviewed and stable Respiratory status: spontaneous breathing Cardiovascular status: blood pressure returned to baseline Postop Assessment: no apparent nausea or vomiting, adequate PO intake and no headache Anesthetic complications: no   No notable events documented.  Adele Barthel Lounette Sloan

## 2021-04-01 NOTE — Op Note (Signed)
OPERATIVE NOTE  Kendra Lane [id number] 04/01/2021   PREOPERATIVE DIAGNOSIS:  Nuclear sclerotic cataract left eye. H25.12   POSTOPERATIVE DIAGNOSIS:    Nuclear sclerotic cataract left eye.     PROCEDURE:  Phacoemusification with posterior chamber intraocular lens placement of the left eye  Ultrasound time: Procedure(s): CATARACT EXTRACTION PHACO AND INTRAOCULAR LENS PLACEMENT (IOC) LEFT 3.64 00:56.9 (Left)  LENS:   Implant Name Type Inv. Item Serial No. Manufacturer Lot No. LRB No. Used Action  LENS IOL TECNIS EYHANCE 21.5 - [id number] Intraocular Lens LENS IOL TECNIS EYHANCE 21.5 [id number] JOHNSON   Left 1 Implanted      SURGEON:  Wyonia Hough, MD   ANESTHESIA:  Topical with tetracaine drops and 2% Xylocaine jelly, augmented with 1% preservative-free intracameral lidocaine.    COMPLICATIONS:  None.   DESCRIPTION OF PROCEDURE:  The patient was identified in the holding room and transported to the operating room and placed in the supine position under the operating microscope.  The left eye was identified as the operative eye and it was prepped and draped in the usual sterile ophthalmic fashion.   A 1 millimeter clear-corneal paracentesis was made at the 1:30 position.  0.5 ml of preservative-free 1% lidocaine was injected into the anterior chamber.  The anterior chamber was filled with Viscoat viscoelastic.  A 2.4 millimeter keratome was used to make a near-clear corneal incision at the 10:30 position.  .  A curvilinear capsulorrhexis was made with a cystotome and capsulorrhexis forceps.  Balanced salt solution was used to hydrodissect and hydrodelineate the nucleus.   Phacoemulsification was then used in stop and chop fashion to remove the lens nucleus and epinucleus.  The remaining cortex was then removed using the irrigation and aspiration handpiece. Provisc was then placed into the capsular bag to distend it for lens placement.  A lens was then injected into the  capsular bag.  The remaining viscoelastic was aspirated.   Wounds were hydrated with balanced salt solution.  The anterior chamber was inflated to a physiologic pressure with balanced salt solution.  No wound leaks were noted. Cefuroxime 0.1 ml of a 10mg /ml solution was injected into the anterior chamber for a dose of 1 mg of intracameral antibiotic at the completion of the case.   Timolol and Brimonidine drops were applied to the eye.  The patient was taken to the recovery room in stable condition without complications of anesthesia or surgery.  [name] 04/01/2021, 2:23 PM

## 2021-04-01 NOTE — Transfer of Care (Signed)
Immediate Anesthesia Transfer of Care Note  Patient: Kendra Lane  Procedure(s) Performed: CATARACT EXTRACTION PHACO AND INTRAOCULAR LENS PLACEMENT (IOC) LEFT 3.64 00:56.9 (Left: Eye)  Patient Location: PACU  Anesthesia Type: MAC  Level of Consciousness: awake, alert  and patient cooperative  Airway and Oxygen Therapy: Patient Spontanous Breathing and Patient connected to supplemental oxygen  Post-op Assessment: Post-op Vital signs reviewed, Patient's Cardiovascular Status Stable, Respiratory Function Stable, Patent Airway and No signs of Nausea or vomiting  Post-op Vital Signs: Reviewed and stable  Complications: No notable events documented.

## 2021-04-01 NOTE — Anesthesia Procedure Notes (Signed)
Procedure Name: MAC Date/Time: 04/01/2021 2:08 PM Performed by: Cameron Ali, CRNA Pre-anesthesia Checklist: Patient identified, Emergency Drugs available, Suction available, Timeout performed and Patient being monitored Patient Re-evaluated:Patient Re-evaluated prior to induction Oxygen Delivery Method: Nasal cannula Placement Confirmation: positive ETCO2

## 2021-04-01 NOTE — H&P (Signed)
[facility]   Primary Care Physician:  Glean Hess, MD Ophthalmologist: Dr. Leandrew Koyanagi  Pre-Procedure History & Physical: HPI:  Kendra Lane is a 86 y.o. female here for ophthalmic surgery.   Past Medical History:  Diagnosis Date   Dysrhythmia    hx of, ok now   GERD (gastroesophageal reflux disease)    Headache    twice a day sometimes   Hypertension    Neck pain    Shoulder pain, left     Past Surgical History:  Procedure Laterality Date   ABDOMINAL HYSTERECTOMY     CATARACT EXTRACTION W/PHACO Right 07/23/2020   Procedure: CATARACT EXTRACTION PHACO AND INTRAOCULAR LENS PLACEMENT (IOC) RIGHT 7.62 01:26.5 8.8%;  Surgeon: Leandrew Koyanagi, MD;  Location: [place];  Service: Ophthalmology;  Laterality: Right;   ESOPHAGOGASTRODUODENOSCOPY (EGD) WITH ESOPHAGEAL DILATION  10/2014   Dr. Allen Norris   TOTAL VAGINAL HYSTERECTOMY  1993   TUBAL LIGATION      Prior to Admission medications   Medication Sig Start Date End Date Taking? Authorizing Provider  acetaminophen (TYLENOL) 500 MG tablet Take 500 mg by mouth every 6 (six) hours as needed for headache.   Yes [provider]  amLODipine (NORVASC) 10 MG tablet TAKE 1 TABLET BY MOUTH DAILY 03/01/21  Yes Glean Hess, MD  aspirin 81 MG tablet Take 81 mg by mouth daily.   Yes [provider]  baclofen (LIORESAL) 10 MG tablet TAKE 1 TABLET(10 MG) BY MOUTH AT BEDTIME 03/25/21  Yes Glean Hess, MD  calcium-vitamin D (OSCAL WITH D) 500-200 MG-UNIT tablet Take 1 tablet by mouth. 600 mg daily   Yes [provider]  metoprolol succinate (TOPROL-XL) 25 MG 24 hr tablet TAKE 2 TABLETS(50 MG) BY MOUTH DAILY 11/01/19  Yes Glean Hess, MD  Multiple Vitamins-Minerals (CENTRUM SILVER 50+WOMEN PO) Take 1 tablet by mouth daily.   Yes [provider]  olmesartan (BENICAR) 20 MG tablet TAKE 1 TABLET(20 MG) BY MOUTH DAILY 12/04/20  Yes Glean Hess, MD  omeprazole  (PRILOSEC) 20 MG capsule TAKE 1 CAPSULE(20 MG) BY MOUTH DAILY 06/04/20  Yes Glean Hess, MD  predniSONE (STERAPRED UNI-PAK 21 TAB) 10 MG (21) TBPK tablet 6 tablets on day 1; decrease by 1 tablet daily until gone. Patient not taking: Reported on 03/23/2021 12/31/20   Coral Spikes, DO    Allergies as of 03/11/2021   (No Known Allergies)    Family History  Problem Relation Age of Onset   Hypertension Mother    Other Father        "old age"   Breast cancer Daughter 29    Social History   Socioeconomic History   Marital status: Widowed    Spouse name: Not on file   Number of children: 8   Years of education: Not on file   Highest education level: 10th grade  Occupational History   Occupation: Retired  Tobacco Use   Smoking status: Never   Smokeless tobacco: Never   Tobacco comments:    smoking cessation materials not required  Vaping Use   Vaping Use: Never used  Substance and Sexual Activity   Alcohol use: Not Currently    Alcohol/week: 2.0 standard drinks    Types: 2 Cans of beer per week    Comment: last use 08/2019   Drug use: No   Sexual activity: Not Currently  Other Topics Concern   Not on file  Social History Narrative  Pt's son lives with her   Social Determinants of Health   Financial Resource Strain: Low Risk    Difficulty of Paying Living Expenses: Not hard at all  Food Insecurity: No Food Insecurity   Worried About Charity fundraiser in the Last Year: Never true   Arboriculturist in the Last Year: Never true  Transportation Needs: No Transportation Needs   Lack of Transportation (Medical): No   Lack of Transportation (Non-Medical): No  Physical Activity: Sufficiently Active   Days of Exercise per Week: 7 days   Minutes of Exercise per Session: 30 min  Stress: No Stress Concern Present   Feeling of Stress : Only a little  Social Connections: Socially Isolated   Frequency of Communication with Friends and Family: More than three times a week    Frequency of Social Gatherings with Friends and Family: More than three times a week   Attends Religious Services: Never   Marine scientist or Organizations: No   Attends Archivist Meetings: Never   Marital Status: Widowed  Human resources officer Violence: Not At Risk   Fear of Current or Ex-Partner: No   Emotionally Abused: No   Physically Abused: No   Sexually Abused: No    Review of Systems: See HPI, otherwise negative ROS  Physical Exam: BP (!) 186/87   Pulse 89   Temp 98.1 F (36.7 C) (Temporal)   Resp 16   Ht 5\' 6"  (1.676 m)   Wt 57.9 kg   SpO2 98%   BMI 20.60 kg/m  General:   Alert,  pleasant and cooperative in NAD Head:  Normocephalic and atraumatic. Lungs:  Clear to auscultation.    Heart:  Regular rate and rhythm.   Impression/Plan: Kendra Lane is here for ophthalmic surgery.  Risks, benefits, limitations, and alternatives regarding ophthalmic surgery have been reviewed with the patient.  Questions have been answered.  All parties agreeable.   Leandrew Koyanagi, MD  04/01/2021, 1:06 PM

## 2021-04-02 ENCOUNTER — Encounter: Payer: Self-pay | Admitting: Ophthalmology

## 2021-04-03 ENCOUNTER — Other Ambulatory Visit: Payer: Self-pay

## 2021-04-03 ENCOUNTER — Encounter: Payer: Self-pay | Admitting: Internal Medicine

## 2021-04-03 ENCOUNTER — Ambulatory Visit (INDEPENDENT_AMBULATORY_CARE_PROVIDER_SITE_OTHER): Payer: Medicare Other | Admitting: Internal Medicine

## 2021-04-03 VITALS — BP 138/84 | HR 94 | Temp 98.6°F | Ht 66.0 in | Wt 128.0 lb

## 2021-04-03 DIAGNOSIS — M791 Myalgia, unspecified site: Secondary | ICD-10-CM | POA: Diagnosis not present

## 2021-04-03 DIAGNOSIS — I10 Essential (primary) hypertension: Secondary | ICD-10-CM

## 2021-04-03 NOTE — Progress Notes (Signed)
Date:  04/03/2021   Name:  Kendra Lane   DOB:  11-Oct-1934   MRN:  [id number]   Chief Complaint: Fatigue (When the weather is hot off and on )  Extremity Weakness  The pain is present in the left upper leg and right upper leg. This is a new problem. The current episode started more than 1 month ago. There has been no history of extremity trauma. The problem occurs intermittently. The problem has been waxing and waning. The quality of the pain is described as aching. The pain is mild. Pertinent negatives include no joint locking, limited range of motion, numbness or tingling. Associated symptoms comments: More difficult to get up from sitting but fine once she is walking . She has tried nothing for the symptoms.  Hypertension This is a chronic problem. The problem is controlled (at home 135/80 range). Pertinent negatives include no chest pain, headaches, palpitations or shortness of breath. Past treatments include calcium channel blockers, beta blockers and angiotensin blockers.   Stress ECHO 11/2020: INTERPRETATION Normal Stress Echocardiogram NORMAL RIGHT VENTRICULAR SYSTOLIC FUNCTION TRIVIAL REGURGITATION NOTED (See above) NO VALVULAR STENOSIS NOTED Lab Results  Component Value Date   CREATININE 0.62 10/07/2020   BUN 11 10/07/2020   NA 143 10/07/2020   K 4.1 10/07/2020   CL 103 10/07/2020   CO2 24 10/07/2020   Lab Results  Component Value Date   CHOL 241 (H) 06/12/2020   HDL 92 06/12/2020   LDLCALC 134 (H) 06/12/2020   TRIG 86 06/12/2020   CHOLHDL 2.6 06/12/2020   Lab Results  Component Value Date   TSH 1.240 06/12/2020   Lab Results  Component Value Date   HGBA1C 5.7 (H) 06/12/2019   Lab Results  Component Value Date   WBC 3.9 06/12/2020   HGB 14.3 06/12/2020   HCT 44.8 06/12/2020   MCV 92 06/12/2020   PLT 351 06/12/2020   Lab Results  Component Value Date   ALT 13 06/12/2020   AST 15 06/12/2020   ALKPHOS 49 06/12/2020   BILITOT 0.3 06/12/2020      Review of Systems  Constitutional:  Negative for appetite change, chills, fatigue and unexpected weight change.  HENT:  Negative for nosebleeds.   Eyes:  Negative for visual disturbance.  Respiratory:  Negative for cough, chest tightness, shortness of breath and wheezing.   Cardiovascular:  Negative for chest pain, palpitations and leg swelling.  Gastrointestinal:  Negative for abdominal pain, constipation and diarrhea.  Genitourinary:  Negative for dysuria and urgency.  Musculoskeletal:  Positive for extremity weakness and myalgias. Negative for arthralgias, back pain and gait problem.  Neurological:  Negative for dizziness, tingling, weakness, light-headedness, numbness and headaches.  Psychiatric/Behavioral:  Negative for dysphoric mood and sleep disturbance. The patient is not nervous/anxious.    Patient Active Problem List   Diagnosis Date Noted   Age-related osteoporosis without current pathological fracture 06/12/2020   Aortic atherosclerosis ([place]) 04/02/2020   Arthritis of shoulder 11/30/2017   Bigeminy 04/07/2016   Benign esophageal stricture 01/21/2015   Essential (primary) hypertension 01/21/2015   Hammer toe 01/21/2015   Lipoma of skin and subcutaneous tissue of trunk 01/21/2015    No Known Allergies  Past Surgical History:  Procedure Laterality Date   ABDOMINAL HYSTERECTOMY     CATARACT EXTRACTION W/PHACO Right 07/23/2020   Procedure: CATARACT EXTRACTION PHACO AND INTRAOCULAR LENS PLACEMENT (IOC) RIGHT 7.62 01:26.5 8.8%;  Surgeon: Leandrew Koyanagi, MD;  Location: [facility];  Service: Ophthalmology;  Laterality:  Right;   CATARACT EXTRACTION W/PHACO Left 04/01/2021   Procedure: CATARACT EXTRACTION PHACO AND INTRAOCULAR LENS PLACEMENT (IOC) LEFT 3.64 00:56.9;  Surgeon: Leandrew Koyanagi, MD;  Location: [facility];  Service: Ophthalmology;  Laterality: Left;   ESOPHAGOGASTRODUODENOSCOPY (EGD) WITH ESOPHAGEAL DILATION  10/2014   Dr. Allen Norris    TOTAL VAGINAL HYSTERECTOMY  1993   TUBAL LIGATION      Social History   Tobacco Use   Smoking status: Never   Smokeless tobacco: Never   Tobacco comments:    smoking cessation materials not required  Vaping Use   Vaping Use: Never used  Substance Use Topics   Alcohol use: Not Currently    Alcohol/week: 2.0 standard drinks    Types: 2 Cans of beer per week    Comment: last use 08/2019   Drug use: No     Medication list has been reviewed and updated.  Current Meds  Medication Sig   acetaminophen (TYLENOL) 500 MG tablet Take 500 mg by mouth every 6 (six) hours as needed for headache.   amLODipine (NORVASC) 10 MG tablet TAKE 1 TABLET BY MOUTH DAILY   aspirin 81 MG tablet Take 81 mg by mouth daily.   baclofen (LIORESAL) 10 MG tablet TAKE 1 TABLET(10 MG) BY MOUTH AT BEDTIME   calcium-vitamin D (OSCAL WITH D) 500-200 MG-UNIT tablet Take 1 tablet by mouth. 600 mg daily   metoprolol succinate (TOPROL-XL) 25 MG 24 hr tablet TAKE 2 TABLETS(50 MG) BY MOUTH DAILY   Multiple Vitamins-Minerals (CENTRUM SILVER 50+WOMEN PO) Take 1 tablet by mouth daily.   olmesartan (BENICAR) 20 MG tablet TAKE 1 TABLET(20 MG) BY MOUTH DAILY   omeprazole (PRILOSEC) 20 MG capsule TAKE 1 CAPSULE(20 MG) BY MOUTH DAILY    PHQ 2/9 Scores 04/03/2021 10/15/2020 07/22/2020 07/15/2020  PHQ - 2 Score 0 4 0 0  PHQ- 9 Score 4 6 0 0    GAD 7 : Generalized Anxiety Score 04/03/2021 10/15/2020 07/22/2020 07/15/2020  Nervous, Anxious, on Edge '1 1 1 '$ 0  Control/stop worrying 2 3 0 0  Worry too much - different things 2 3 0 0  Trouble relaxing 0 0 0 0  Restless 0 0 0 0  Easily annoyed or irritable 0 1 0 0  Afraid - awful might happen 0 0 0 0  Total GAD 7 Score '5 8 1 '$ 0  Anxiety Difficulty - Not difficult at all Not difficult at all Not difficult at all    BP Readings from Last 3 Encounters:  04/03/21 138/84  04/01/21 134/81  12/31/20 (!) 161/89    Physical Exam Vitals and nursing note reviewed.  Constitutional:       General: She is not in acute distress.    Appearance: Normal appearance. She is well-developed.  HENT:     Head: Normocephalic and atraumatic.  Cardiovascular:     Rate and Rhythm: Normal rate and regular rhythm.     Pulses: Normal pulses.  Pulmonary:     Effort: Pulmonary effort is normal. No respiratory distress.     Breath sounds: No wheezing or rhonchi.  Musculoskeletal:     Cervical back: Normal range of motion.     Right hip: No bony tenderness. Normal range of motion.     Left hip: No bony tenderness. Normal range of motion.     Right upper leg: No swelling or tenderness.     Left upper leg: No swelling or tenderness.     Right lower leg: No edema.  Left lower leg: No edema.  Lymphadenopathy:     Cervical: No cervical adenopathy.  Skin:    General: Skin is warm and dry.     Capillary Refill: Capillary refill takes less than 2 seconds.     Findings: No rash.  Neurological:     General: No focal deficit present.     Mental Status: She is alert and oriented to person, place, and time.  Psychiatric:        Mood and Affect: Mood normal.        Behavior: Behavior normal.    Wt Readings from Last 3 Encounters:  04/03/21 128 lb (58.1 kg)  04/01/21 127 lb 9.6 oz (57.9 kg)  12/31/20 127 lb (57.6 kg)    BP 138/84   Pulse 94   Temp 98.6 F (37 C) (Oral)   Ht '5\' 6"'$  (1.676 m)   Wt 128 lb (58.1 kg)   SpO2 97%   BMI 20.66 kg/m   Assessment and Plan: 1. Essential (primary) hypertension Fair control on three meds Continue to monitor at home Increase fluids and avoid the heat Will check labs since HR is slightly elevated - CBC with Differential/Platelet - Basic metabolic panel  2. Myalgia Vitamin D has been slightly low - will recheck Also check B12 Low consideration for PMR - Vitamin B12 - VITAMIN D 25 Hydroxy (Vit-D Deficiency, Fractures)   Partially dictated using Editor, commissioning. Any errors are unintentional.  Halina Maidens, MD [facility]  04/03/2021

## 2021-04-04 LAB — CBC WITH DIFFERENTIAL/PLATELET
Basophils Absolute: 0.1 10*3/uL (ref 0.0–0.2)
Basos: 1 %
EOS (ABSOLUTE): 0.1 10*3/uL (ref 0.0–0.4)
Eos: 1 %
Hematocrit: 40 % (ref 34.0–46.6)
Hemoglobin: 13 g/dL (ref 11.1–15.9)
Immature Grans (Abs): 0 10*3/uL (ref 0.0–0.1)
Immature Granulocytes: 0 %
Lymphocytes Absolute: 2.6 10*3/uL (ref 0.7–3.1)
Lymphs: 46 %
MCH: 30 pg (ref 26.6–33.0)
MCHC: 32.5 g/dL (ref 31.5–35.7)
MCV: 92 fL (ref 79–97)
Monocytes Absolute: 0.7 10*3/uL (ref 0.1–0.9)
Monocytes: 12 %
Neutrophils Absolute: 2.2 10*3/uL (ref 1.4–7.0)
Neutrophils: 40 %
Platelets: 334 10*3/uL (ref 150–450)
RBC: 4.33 x10E6/uL (ref 3.77–5.28)
RDW: 12.5 % (ref 11.7–15.4)
WBC: 5.6 10*3/uL (ref 3.4–10.8)

## 2021-04-04 LAB — BASIC METABOLIC PANEL
BUN/Creatinine Ratio: 15 (ref 12–28)
BUN: 10 mg/dL (ref 8–27)
CO2: 23 mmol/L (ref 20–29)
Calcium: 9.9 mg/dL (ref 8.7–10.3)
Chloride: 103 mmol/L (ref 96–106)
Creatinine, Ser: 0.65 mg/dL (ref 0.57–1.00)
Glucose: 97 mg/dL (ref 65–99)
Potassium: 3.9 mmol/L (ref 3.5–5.2)
Sodium: 142 mmol/L (ref 134–144)
eGFR: 87 mL/min/{1.73_m2} (ref >59)

## 2021-04-04 LAB — VITAMIN D 25 HYDROXY (VIT D DEFICIENCY, FRACTURES): Vit D, 25-Hydroxy: 31.4 ng/mL (ref 30.0–100.0)

## 2021-04-04 LAB — VITAMIN B12: Vitamin B-12: 507 pg/mL (ref 232–1245)

## 2021-04-08 DIAGNOSIS — I10 Essential (primary) hypertension: Secondary | ICD-10-CM | POA: Diagnosis not present

## 2021-04-08 DIAGNOSIS — I700 Atherosclerosis of aorta: Secondary | ICD-10-CM | POA: Diagnosis not present

## 2021-06-08 ENCOUNTER — Ambulatory Visit (INDEPENDENT_AMBULATORY_CARE_PROVIDER_SITE_OTHER): Payer: Medicare Other

## 2021-06-08 ENCOUNTER — Other Ambulatory Visit: Payer: Self-pay

## 2021-06-08 VITALS — BP 152/72 | HR 89 | Temp 98.7°F | Resp 16 | Ht 66.0 in | Wt 127.8 lb

## 2021-06-08 DIAGNOSIS — Z0000 Encounter for general adult medical examination without abnormal findings: Secondary | ICD-10-CM | POA: Diagnosis not present

## 2021-06-08 NOTE — Patient Instructions (Signed)
Kendra Lane , Thank you for taking time to come for your Medicare Wellness Visit. I appreciate your ongoing commitment to your health goals. Please review the following plan we discussed and let me know if I can assist you in the future.   Screening recommendations/referrals: Colonoscopy: no longer required Mammogram: done 12/25/20 Bone Density: done 06/15/18 Recommended yearly ophthalmology/optometry visit for glaucoma screening and checkup Recommended yearly dental visit for hygiene and checkup  Vaccinations: Influenza vaccine: declined Pneumococcal vaccine: done 04/07/16 Tdap vaccine: due Shingles vaccine: Shingrix discussed. Please contact your pharmacy for coverage information.  Covid-19:done 02/25/20 & 03/24/20  Conditions/risks identified: Continue monitoring blood pressure to help manage hypertension.   Next appointment: Follow up in one year for your annual wellness visit    Preventive Care 65 Years and Older, Female Preventive care refers to lifestyle choices and visits with your health care provider that can promote health and wellness. What does preventive care include? A yearly physical exam. This is also called an annual well check. Dental exams once or twice a year. Routine eye exams. Ask your health care provider how often you should have your eyes checked. Personal lifestyle choices, including: Daily care of your teeth and gums. Regular physical activity. Eating a healthy diet. Avoiding tobacco and drug use. Limiting alcohol use. Practicing safe sex. Taking low-dose aspirin every day. Taking vitamin and mineral supplements as recommended by your health care provider. What happens during an annual well check? The services and screenings done by your health care provider during your annual well check will depend on your age, overall health, lifestyle risk factors, and family history of disease. Counseling  Your health care provider may ask you questions about  your: Alcohol use. Tobacco use. Drug use. Emotional well-being. Home and relationship well-being. Sexual activity. Eating habits. History of falls. Memory and ability to understand (cognition). Work and work Statistician. Reproductive health. Screening  You may have the following tests or measurements: Height, weight, and BMI. Blood pressure. Lipid and cholesterol levels. These may be checked every 5 years, or more frequently if you are over 47 years old. Skin check. Lung cancer screening. You may have this screening every year starting at age 53 if you have a 30-pack-year history of smoking and currently smoke or have quit within the past 15 years. Fecal occult blood test (FOBT) of the stool. You may have this test every year starting at age 71. Flexible sigmoidoscopy or colonoscopy. You may have a sigmoidoscopy every 5 years or a colonoscopy every 10 years starting at age 44. Hepatitis C blood test. Hepatitis B blood test. Sexually transmitted disease (STD) testing. Diabetes screening. This is done by checking your blood sugar (glucose) after you have not eaten for a while (fasting). You may have this done every 1-3 years. Bone density scan. This is done to screen for osteoporosis. You may have this done starting at age 57. Mammogram. This may be done every 1-2 years. Talk to your health care provider about how often you should have regular mammograms. Talk with your health care provider about your test results, treatment options, and if necessary, the need for more tests. Vaccines  Your health care provider may recommend certain vaccines, such as: Influenza vaccine. This is recommended every year. Tetanus, diphtheria, and acellular pertussis (Tdap, Td) vaccine. You may need a Td booster every 10 years. Zoster vaccine. You may need this after age 41. Pneumococcal 13-valent conjugate (PCV13) vaccine. One dose is recommended after age 67. Pneumococcal polysaccharide (PPSV23) vaccine.  One dose is recommended after age 55. Talk to your health care provider about which screenings and vaccines you need and how often you need them. This information is not intended to replace advice given to you by your health care provider. Make sure you discuss any questions you have with your health care provider. Document Released: 09/26/2015 Document Revised: 05/19/2016 Document Reviewed: 07/01/2015 Elsevier Interactive Patient Education  2017 [employer] Prevention in the Home Falls can cause injuries. They can happen to people of all ages. There are many things you can do to make your home safe and to help prevent falls. What can I do on the outside of my home? Regularly fix the edges of walkways and driveways and fix any cracks. Remove anything that might make you trip as you walk through a door, such as a raised step or threshold. Trim any bushes or trees on the path to your home. Use bright outdoor lighting. Clear any walking paths of anything that might make someone trip, such as rocks or tools. Regularly check to see if handrails are loose or broken. Make sure that both sides of any steps have handrails. Any raised decks and porches should have guardrails on the edges. Have any leaves, snow, or ice cleared regularly. Use sand or salt on walking paths during winter. Clean up any spills in your garage right away. This includes oil or grease spills. What can I do in the bathroom? Use night lights. Install grab bars by the toilet and in the tub and shower. Do not use towel bars as grab bars. Use non-skid mats or decals in the tub or shower. If you need to sit down in the shower, use a plastic, non-slip stool. Keep the floor dry. Clean up any water that spills on the floor as soon as it happens. Remove soap buildup in the tub or shower regularly. Attach bath mats securely with double-sided non-slip rug tape. Do not have throw rugs and other things on the floor that can make  you trip. What can I do in the bedroom? Use night lights. Make sure that you have a light by your bed that is easy to reach. Do not use any sheets or blankets that are too big for your bed. They should not hang down onto the floor. Have a firm chair that has side arms. You can use this for support while you get dressed. Do not have throw rugs and other things on the floor that can make you trip. What can I do in the kitchen? Clean up any spills right away. Avoid walking on wet floors. Keep items that you use a lot in easy-to-reach places. If you need to reach something above you, use a strong step stool that has a grab bar. Keep electrical cords out of the way. Do not use floor polish or wax that makes floors slippery. If you must use wax, use non-skid floor wax. Do not have throw rugs and other things on the floor that can make you trip. What can I do with my stairs? Do not leave any items on the stairs. Make sure that there are handrails on both sides of the stairs and use them. Fix handrails that are broken or loose. Make sure that handrails are as long as the stairways. Check any carpeting to make sure that it is firmly attached to the stairs. Fix any carpet that is loose or worn. Avoid having throw rugs at the top or bottom of the  stairs. If you do have throw rugs, attach them to the floor with carpet tape. Make sure that you have a light switch at the top of the stairs and the bottom of the stairs. If you do not have them, ask someone to add them for you. What else can I do to help prevent falls? Wear shoes that: Do not have high heels. Have rubber bottoms. Are comfortable and fit you well. Are closed at the toe. Do not wear sandals. If you use a stepladder: Make sure that it is fully opened. Do not climb a closed stepladder. Make sure that both sides of the stepladder are locked into place. Ask someone to hold it for you, if possible. Clearly mark and make sure that you can  see: Any grab bars or handrails. First and last steps. Where the edge of each step is. Use tools that help you move around (mobility aids) if they are needed. These include: Canes. Walkers. Scooters. Crutches. Turn on the lights when you go into a dark area. Replace any light bulbs as soon as they burn out. Set up your furniture so you have a clear path. Avoid moving your furniture around. If any of your floors are uneven, fix them. If there are any pets around you, be aware of where they are. Review your medicines with your doctor. Some medicines can make you feel dizzy. This can increase your chance of falling. Ask your doctor what other things that you can do to help prevent falls. This information is not intended to replace advice given to you by your health care provider. Make sure you discuss any questions you have with your health care provider. Document Released: 06/26/2009 Document Revised: 02/05/2016 Document Reviewed: 10/04/2014 Elsevier Interactive Patient Education  2017 [employer].

## 2021-06-08 NOTE — Progress Notes (Signed)
Subjective:   Kendra Lane is a 86 y.o. female who presents for Medicare Annual (Subsequent) preventive examination.  Review of Systems     Cardiac Risk Factors include: advanced age (>72men, >70 women);hypertension     Objective:    Today's Vitals   06/08/21 1003  BP: (!) 152/72  Pulse: 89  Resp: 16  Temp: 98.7 F (37.1 C)  TempSrc: Oral  SpO2: 99%  Weight: 127 lb 12.8 oz (58 kg)  Height: 5\' 6"  (1.676 m)  PainSc: 5    Body mass index is 20.63 kg/m.  Advanced Directives 06/08/2021 04/01/2021 07/23/2020 06/04/2020 11/22/2019 10/21/2019 06/04/2019  Does Patient Have a Medical Advance Directive? Yes No No Yes No No No  Type of Paramedic of [place];Living will - - [place];Living will - - -  Copy of [place] in Chart? Yes - validated most recent copy scanned in chart (See row information) - - Yes - validated most recent copy scanned in chart (See row information) - - -  Would patient like information on creating a medical advance directive? - No - Patient declined No - Patient declined - - - Yes (MAU/Ambulatory/Procedural Areas - Information given)    Current Medications (verified) Outpatient Encounter Medications as of 06/08/2021  Medication Sig   acetaminophen (TYLENOL) 500 MG tablet Take 500 mg by mouth every 6 (six) hours as needed for headache.   amLODipine (NORVASC) 10 MG tablet TAKE 1 TABLET BY MOUTH DAILY   aspirin 81 MG tablet Take 81 mg by mouth daily.   calcium-vitamin D (OSCAL WITH D) 500-200 MG-UNIT tablet Take 1 tablet by mouth. 600 mg daily   metoprolol succinate (TOPROL-XL) 25 MG 24 hr tablet TAKE 2 TABLETS(50 MG) BY MOUTH DAILY   Multiple Vitamins-Minerals (CENTRUM SILVER 50+WOMEN PO) Take 1 tablet by mouth daily.   olmesartan (BENICAR) 20 MG tablet TAKE 1 TABLET(20 MG) BY MOUTH DAILY   omeprazole (PRILOSEC) 20 MG capsule TAKE 1 CAPSULE(20 MG) BY MOUTH DAILY   chlorthalidone (HYGROTON) 25 MG  tablet Take 25 mg by mouth daily. (Patient not taking: Reported on 06/08/2021)   [DISCONTINUED] baclofen (LIORESAL) 10 MG tablet TAKE 1 TABLET(10 MG) BY MOUTH AT BEDTIME   No facility-administered encounter medications on file as of 06/08/2021.    Allergies (verified) Patient has no known allergies.   History: Past Medical History:  Diagnosis Date   Dysrhythmia    hx of, ok now   GERD (gastroesophageal reflux disease)    Headache    twice a day sometimes   Hypertension    Neck pain    Shoulder pain, left    Past Surgical History:  Procedure Laterality Date   ABDOMINAL HYSTERECTOMY     CATARACT EXTRACTION W/PHACO Right 07/23/2020   Procedure: CATARACT EXTRACTION PHACO AND INTRAOCULAR LENS PLACEMENT (IOC) RIGHT 7.62 01:26.5 8.8%;  Surgeon: Leandrew Koyanagi, MD;  Location: [place];  Service: Ophthalmology;  Laterality: Right;   CATARACT EXTRACTION W/PHACO Left 04/01/2021   Procedure: CATARACT EXTRACTION PHACO AND INTRAOCULAR LENS PLACEMENT (IOC) LEFT 3.64 00:56.9;  Surgeon: Leandrew Koyanagi, MD;  Location: [place];  Service: Ophthalmology;  Laterality: Left;   ESOPHAGOGASTRODUODENOSCOPY (EGD) WITH ESOPHAGEAL DILATION  10/2014   Dr. Allen Norris   TOTAL VAGINAL HYSTERECTOMY  1993   TUBAL LIGATION     Family History  Problem Relation Age of Onset   Hypertension Mother    Other Father        "old age"  Breast cancer Daughter 46   Social History   Socioeconomic History   Marital status: Widowed    Spouse name: Not on file   Number of children: 8   Years of education: Not on file   Highest education level: 10th grade  Occupational History   Occupation: Retired  Tobacco Use   Smoking status: Never   Smokeless tobacco: Never   Tobacco comments:    smoking cessation materials not required  Vaping Use   Vaping Use: Never used  Substance and Sexual Activity   Alcohol use: Not Currently    Alcohol/week: 2.0 standard drinks    Types: 2 Cans of beer  per week    Comment: last use 08/2019   Drug use: No   Sexual activity: Not Currently  Other Topics Concern   Not on file  Social History Narrative   Pt's son lives with her   Social Determinants of Health   Financial Resource Strain: Low Risk    Difficulty of Paying Living Expenses: Not hard at all  Food Insecurity: No Food Insecurity   Worried About Charity fundraiser in the Last Year: Never true   Arboriculturist in the Last Year: Never true  Transportation Needs: No Transportation Needs   Lack of Transportation (Medical): No   Lack of Transportation (Non-Medical): No  Physical Activity: Sufficiently Active   Days of Exercise per Week: 7 days   Minutes of Exercise per Session: 30 min  Stress: No Stress Concern Present   Feeling of Stress : Only a little  Social Connections: Socially Isolated   Frequency of Communication with Friends and Family: More than three times a week   Frequency of Social Gatherings with Friends and Family: More than three times a week   Attends Religious Services: Never   Marine scientist or Organizations: No   Attends Archivist Meetings: Never   Marital Status: Widowed    Tobacco Counseling Counseling given: Not Answered Tobacco comments: smoking cessation materials not required   Clinical Intake:  Pre-visit preparation completed: Yes  Pain : 0-10 Pain Score: 5  Pain Type: Chronic pain Pain Location: Shoulder Pain Orientation: Right Pain Descriptors / Indicators: Constant Pain Onset: More than a month ago Pain Frequency: Intermittent     BMI - recorded: 20.63 Nutritional Status: BMI of 19-24  Normal Nutritional Risks: None Diabetes: No  How often do you need to have someone help you when you read instructions, pamphlets, or other written materials from your doctor or pharmacy?: 1 - Never    Interpreter Needed?: No  Information entered by :: Clemetine Marker LPN   Activities of Daily Living In your present  state of health, do you have any difficulty performing the following activities: 06/08/2021 04/03/2021  Hearing? N N  Vision? N N  Difficulty concentrating or making decisions? N N  Walking or climbing stairs? N N  Dressing or bathing? N N  Doing errands, shopping? N N  Preparing Food and eating ? N -  Using the Toilet? N -  In the past six months, have you accidently leaked urine? N -  Do you have problems with loss of bowel control? N -  Managing your Medications? N -  Managing your Finances? N -  Housekeeping or managing your Housekeeping? N -  Some recent data might be hidden    Patient Care Team: Glean Hess, MD as PCP - General (Internal Medicine) Corey Skains, MD as Consulting  Physician (Cardiology)  Indicate any recent Medical Services you may have received from other than [facility] providers in the past year (date may be approximate).     Assessment:   This is a routine wellness examination for [place].  Hearing/Vision screen Hearing Screening - Comments:: Pt denies hearing difficulty Vision Screening - Comments:: [facility] screening [facility]   Dietary issues and exercise activities discussed: Current Exercise Habits: Home exercise routine, Type of exercise: walking, Time (Minutes): 30, Frequency (Times/Week): 7, Weekly Exercise (Minutes/Week): 210, Intensity: Mild, Exercise limited by: None identified   Goals Addressed             This Visit's Progress    DIET - INCREASE WATER INTAKE   On track    Recommend to drink at least 6-8 8oz glasses of water per day.       Depression Screen PHQ 2/9 Scores 06/08/2021 04/03/2021 10/15/2020 07/22/2020 07/15/2020 06/04/2020 04/02/2020  PHQ - 2 Score 0 0 4 0 0 1 0  PHQ- 9 Score - 4 6 0 0 1 2    Fall Risk Fall Risk  06/08/2021 04/03/2021 10/15/2020 07/22/2020 07/15/2020  Falls in the past year? 0 0 0 0 0  Number falls in past yr: 0 0 0 - 0  Injury with Fall? 0 0 0 - 0  Risk for fall due to : No Fall Risks  History of fall(s) - - No Fall Risks  Risk for fall due to: Comment - - - - -  Follow up Falls prevention discussed Falls evaluation completed Falls evaluation completed Falls evaluation completed Falls evaluation completed    [place]:  Any stairs in or around the home? Yes  If so, are there any without handrails? No  Home free of loose throw rugs in walkways, pet beds, electrical cords, etc? Yes  Adequate lighting in your home to reduce risk of falls? Yes   ASSISTIVE DEVICES UTILIZED TO PREVENT FALLS:  Life alert? No  Use of a cane, walker or w/c? No  Grab bars in the bathroom? Yes  Shower chair or bench in shower? Yes  Elevated toilet seat or a handicapped toilet? Yes   TIMED UP AND GO:  Was the test performed? Yes .  Length of time to ambulate 10 feet: 5 sec.   Gait steady and fast without use of assistive device  Cognitive Function: Normal cognitive status assessed by direct observation by this Nurse Health Advisor. No abnormalities found.     MMSE - Mini Mental State Exam 04/07/2016  Orientation to time 5  Orientation to time comments 6CIT  Orientation to Place 5  Orientation to Place-comments 6CIT  Registration 3  Registration-comments 6CIT  Attention/ Calculation 5  Attention/Calculation-comments 6CIT  Recall 1  Recall-comments 6CIT  Language- name 2 objects 2  Language- name 2 objects-comments 6CIT     6CIT Screen 06/04/2020 06/04/2019 05/29/2018 05/25/2017  What Year? 0 points 0 points 0 points 0 points  What month? 0 points 0 points 0 points 0 points  What time? 0 points 0 points 0 points 0 points  Count back from 20 0 points 0 points 0 points 0 points  Months in reverse 0 points 0 points 0 points 0 points  Repeat phrase 2 points 2 points 6 points 2 points  Total Score 2 2 6 2     Immunizations Immunization History  Administered Date(s) Administered   Moderna Sars-Covid-2 Vaccination 02/25/2020, 03/24/2020    Pneumococcal Conjugate-13  04/07/2016   Pneumococcal Polysaccharide-23 06/26/2012    TDAP status: Due, Education has been provided regarding the importance of this vaccine. Advised may receive this vaccine at local pharmacy or Health Dept. Aware to provide a copy of the vaccination record if obtained from local pharmacy or Health Dept. Verbalized acceptance and understanding.  Flu Vaccine status: Declined, Education has been provided regarding the importance of this vaccine but patient still declined. Advised may receive this vaccine at local pharmacy or Health Dept. Aware to provide a copy of the vaccination record if obtained from local pharmacy or Health Dept. Verbalized acceptance and understanding.  Pneumococcal vaccine status: Up to date  Covid-19 vaccine status: Completed vaccines  Qualifies for Shingles Vaccine? Yes   Zostavax completed No   Shingrix Completed?: No.    Education has been provided regarding the importance of this vaccine. Patient has been advised to call insurance company to determine out of pocket expense if they have not yet received this vaccine. Advised may also receive vaccine at local pharmacy or Health Dept. Verbalized acceptance and understanding.  Screening Tests Health Maintenance  Topic Date Due   Zoster Vaccines- Shingrix (1 of 2) Never done   COVID-19 Vaccine (3 - Booster for Moderna series) 08/24/2020   TETANUS/TDAP  09/13/2021 (Originally 08/25/1954)   INFLUENZA VACCINE  12/11/2021 (Originally 04/13/2021)   MAMMOGRAM  12/25/2021   DEXA SCAN  Completed   HPV VACCINES  Aged Out    Health Maintenance  Health Maintenance Due  Topic Date Due   Zoster Vaccines- Shingrix (1 of 2) Never done   COVID-19 Vaccine (3 - Booster for Moderna series) 08/24/2020    Colorectal cancer screening: No longer required.   Mammogram status: Completed 12/25/20. Repeat every year  Bone Density status: Completed 06/15/18. Results reflect: Bone density results:  OSTEOPOROSIS. Repeat every as directed years.  Lung Cancer Screening: (Low Dose CT Chest recommended if Age 58-80 years, 30 pack-year currently smoking OR have quit w/in 15years.) does not qualify.   Additional Screening:  Hepatitis C Screening: does not qualify  Vision Screening: Recommended annual ophthalmology exams for early detection of glaucoma and other disorders of the eye. Is the patient up to date with their annual eye exam?  Yes  Who is the provider or what is the name of the office in which the patient attends annual eye exams? [facility].   Dental Screening: Recommended annual dental exams for proper oral hygiene  Community Resource Referral / Chronic Care Management: CRR required this visit?  No   CCM required this visit?  No      Plan:     I have personally reviewed and noted the following in the patient's chart:   Medical and social history Use of alcohol, tobacco or illicit drugs  Current medications and supplements including opioid prescriptions.  Functional ability and status Nutritional status Physical activity Advanced directives List of other physicians Hospitalizations, surgeries, and ER visits in previous 12 months Vitals Screenings to include cognitive, depression, and falls Referrals and appointments  In addition, I have reviewed and discussed with patient certain preventive protocols, quality metrics, and best practice recommendations. A written personalized care plan for preventive services as well as general preventive health recommendations were provided to patient.     Clemetine Marker, LPN   [id number]   Nurse Notes: pt's blood pressure elevated today. Start of visit 170/80; end of visit 152/72. Pt was prescribe chlorthalidone by Dr. Nehemiah Massed on 04/08/21 but states not taking due to  making her feel dizzy. Pt scheduled for follow up with cardiology tomorrow; pt also scheduled 06/16/21 for CPE with Dr. Army Melia.

## 2021-06-16 ENCOUNTER — Ambulatory Visit (INDEPENDENT_AMBULATORY_CARE_PROVIDER_SITE_OTHER): Payer: Medicare Other | Admitting: Internal Medicine

## 2021-06-16 ENCOUNTER — Other Ambulatory Visit: Payer: Self-pay

## 2021-06-16 ENCOUNTER — Encounter: Payer: Self-pay | Admitting: Internal Medicine

## 2021-06-16 VITALS — BP 146/84 | HR 97 | Temp 98.0°F | Ht 66.0 in | Wt 130.0 lb

## 2021-06-16 DIAGNOSIS — M810 Age-related osteoporosis without current pathological fracture: Secondary | ICD-10-CM | POA: Diagnosis not present

## 2021-06-16 DIAGNOSIS — I700 Atherosclerosis of aorta: Secondary | ICD-10-CM | POA: Diagnosis not present

## 2021-06-16 DIAGNOSIS — M722 Plantar fascial fibromatosis: Secondary | ICD-10-CM

## 2021-06-16 DIAGNOSIS — Z0000 Encounter for general adult medical examination without abnormal findings: Secondary | ICD-10-CM

## 2021-06-16 DIAGNOSIS — I10 Essential (primary) hypertension: Secondary | ICD-10-CM

## 2021-06-16 DIAGNOSIS — K222 Esophageal obstruction: Secondary | ICD-10-CM | POA: Diagnosis not present

## 2021-06-16 LAB — POCT URINALYSIS DIPSTICK
Bilirubin, UA: NEGATIVE
Glucose, UA: NEGATIVE
Ketones, UA: NEGATIVE
Leukocytes, UA: NEGATIVE
Nitrite, UA: NEGATIVE
Protein, UA: NEGATIVE
Spec Grav, UA: 1.015 (ref 1.010–1.025)
Urobilinogen, UA: 0.2 E.U./dL
pH, UA: 7.5 (ref 5.0–8.0)

## 2021-06-16 NOTE — Progress Notes (Signed)
Date:  06/16/2021   Name:  Kendra Lane   DOB:  02/12/1935   MRN:  [id number]   Chief Complaint: Annual Exam (Breast exam no pap) Kendra Lane is a 86 y.o. female who presents today for her Complete Annual Exam. She feels well. She reports exercising walking X1 day a week. She reports she is sleeping well. Breast complaints none.  Mammogram: 12/2020 DEXA: 06/2018 - Osteoporosis Colonoscopy: aged out  Immunization History  Administered Date(s) Administered   Moderna Sars-Covid-2 Vaccination 02/25/2020, 03/24/2020   Pneumococcal Conjugate-13 04/07/2016   Pneumococcal Polysaccharide-23 06/26/2012    Hypertension This is a chronic problem. The problem is controlled. Pertinent negatives include no chest pain, headaches, palpitations or shortness of breath. Past treatments include calcium channel blockers, angiotensin blockers and beta blockers. The current treatment provides significant improvement. There are no compliance problems.  There is no history of kidney disease, CAD/MI or CVA.  Gastroesophageal Reflux She reports no abdominal pain, no chest pain, no coughing, no dysphagia, no early satiety or no wheezing. The problem has been resolved. Pertinent negatives include no fatigue. She has tried a PPI for the symptoms. The treatment provided significant relief.   Lab Results  Component Value Date   CREATININE 0.65 04/03/2021   BUN 10 04/03/2021   NA 142 04/03/2021   K 3.9 04/03/2021   CL 103 04/03/2021   CO2 23 04/03/2021   Lab Results  Component Value Date   CHOL 241 (H) 06/12/2020   HDL 92 06/12/2020   LDLCALC 134 (H) 06/12/2020   TRIG 86 06/12/2020   CHOLHDL 2.6 06/12/2020   Lab Results  Component Value Date   TSH 1.240 06/12/2020   Lab Results  Component Value Date   HGBA1C 5.7 (H) 06/12/2019   Lab Results  Component Value Date   WBC 5.6 04/03/2021   HGB 13.0 04/03/2021   HCT 40.0 04/03/2021   MCV 92 04/03/2021   PLT 334 04/03/2021   Lab Results   Component Value Date   ALT 13 06/12/2020   AST 15 06/12/2020   ALKPHOS 49 06/12/2020   BILITOT 0.3 06/12/2020  Last vitamin D Lab Results  Component Value Date   VD25OH 31.4 04/03/2021    Review of Systems  Constitutional:  Negative for appetite change, chills, fatigue and fever.  HENT:  Negative for congestion, hearing loss, tinnitus, trouble swallowing and voice change.   Eyes:  Negative for visual disturbance.  Respiratory:  Negative for cough, chest tightness, shortness of breath and wheezing.   Cardiovascular:  Negative for chest pain, palpitations and leg swelling.  Gastrointestinal:  Negative for abdominal pain, constipation, diarrhea, dysphagia and vomiting.  Endocrine: Negative for polydipsia and polyuria.  Genitourinary:  Negative for dysuria, frequency, genital sores, vaginal bleeding and vaginal discharge.  Musculoskeletal:  Positive for gait problem (right foot pain). Negative for arthralgias and joint swelling.  Skin:  Negative for color change and rash.  Neurological:  Negative for dizziness, tremors, light-headedness and headaches.  Hematological:  Negative for adenopathy. Does not bruise/bleed easily.  Psychiatric/Behavioral:  Negative for dysphoric mood and sleep disturbance. The patient is not nervous/anxious.    Patient Active Problem List   Diagnosis Date Noted   Age-related osteoporosis without current pathological fracture 06/12/2020   Aortic atherosclerosis ([place]) 04/02/2020   Arthritis of shoulder 11/30/2017   Bigeminy 04/07/2016   Benign esophageal stricture 01/21/2015   Essential (primary) hypertension 01/21/2015   Hammer toe 01/21/2015   Lipoma of skin and subcutaneous tissue  of trunk 01/21/2015    No Known Allergies  Past Surgical History:  Procedure Laterality Date   ABDOMINAL HYSTERECTOMY     CATARACT EXTRACTION W/PHACO Right 07/23/2020   Procedure: CATARACT EXTRACTION PHACO AND INTRAOCULAR LENS PLACEMENT (IOC) RIGHT 7.62 01:26.5 8.8%;   Surgeon: Leandrew Koyanagi, MD;  Location: [place];  Service: Ophthalmology;  Laterality: Right;   CATARACT EXTRACTION W/PHACO Left 04/01/2021   Procedure: CATARACT EXTRACTION PHACO AND INTRAOCULAR LENS PLACEMENT (IOC) LEFT 3.64 00:56.9;  Surgeon: Leandrew Koyanagi, MD;  Location: [place];  Service: Ophthalmology;  Laterality: Left;   ESOPHAGOGASTRODUODENOSCOPY (EGD) WITH ESOPHAGEAL DILATION  10/2014   Dr. Allen Norris   TOTAL VAGINAL HYSTERECTOMY  1993   TUBAL LIGATION      Social History   Tobacco Use   Smoking status: Never   Smokeless tobacco: Never   Tobacco comments:    smoking cessation materials not required  Vaping Use   Vaping Use: Never used  Substance Use Topics   Alcohol use: Not Currently    Alcohol/week: 2.0 standard drinks    Types: 2 Cans of beer per week    Comment: last use 08/2019   Drug use: No     Medication list has been reviewed and updated.  Current Meds  Medication Sig   amLODipine (NORVASC) 10 MG tablet TAKE 1 TABLET BY MOUTH DAILY   aspirin 81 MG tablet Take 81 mg by mouth daily.   calcium-vitamin D (OSCAL WITH D) 500-200 MG-UNIT tablet Take 1 tablet by mouth. 600 mg daily   metoprolol succinate (TOPROL-XL) 25 MG 24 hr tablet TAKE 2 TABLETS(50 MG) BY MOUTH DAILY   Multiple Vitamins-Minerals (CENTRUM SILVER 50+WOMEN PO) Take 1 tablet by mouth daily.   olmesartan (BENICAR) 20 MG tablet TAKE 1 TABLET(20 MG) BY MOUTH DAILY   omeprazole (PRILOSEC) 20 MG capsule TAKE 1 CAPSULE(20 MG) BY MOUTH DAILY   [DISCONTINUED] acetaminophen (TYLENOL) 500 MG tablet Take 500 mg by mouth every 6 (six) hours as needed for headache.   [DISCONTINUED] chlorthalidone (HYGROTON) 25 MG tablet Take 25 mg by mouth daily.    PHQ 2/9 Scores 06/16/2021 06/08/2021 04/03/2021 10/15/2020  PHQ - 2 Score 2 0 0 4  PHQ- 9 Score 4 - 4 6    GAD 7 : Generalized Anxiety Score 06/16/2021 04/03/2021 10/15/2020 07/22/2020  Nervous, Anxious, on Edge 1 1 1 1   Control/stop  worrying 1 2 3  0  Worry too much - different things 1 2 3  0  Trouble relaxing 0 0 0 0  Restless 0 0 0 0  Easily annoyed or irritable 0 0 1 0  Afraid - awful might happen 0 0 0 0  Total GAD 7 Score 3 5 8 1   Anxiety Difficulty - - Not difficult at all Not difficult at all    BP Readings from Last 3 Encounters:  06/16/21 (!) 146/84  06/08/21 (!) 152/72  04/03/21 138/84    Physical Exam Vitals and nursing note reviewed.  Constitutional:      General: She is not in acute distress.    Appearance: She is well-developed.  HENT:     Head: Normocephalic and atraumatic.     Right Ear: Tympanic membrane and ear canal normal.     Left Ear: Tympanic membrane and ear canal normal.     Nose:     Right Sinus: No maxillary sinus tenderness.     Left Sinus: No maxillary sinus tenderness.  Eyes:     General: No scleral icterus.  Right eye: No discharge.        Left eye: No discharge.     Conjunctiva/sclera: Conjunctivae normal.  Neck:     Thyroid: No thyromegaly.     Vascular: No carotid bruit.  Cardiovascular:     Rate and Rhythm: Normal rate and regular rhythm.     Pulses: Normal pulses.     Heart sounds: Normal heart sounds.  Pulmonary:     Effort: Pulmonary effort is normal. No respiratory distress.     Breath sounds: No wheezing.  Chest:  Breasts:    Right: No mass, nipple discharge, skin change or tenderness.     Left: No mass, nipple discharge, skin change or tenderness.  Abdominal:     General: Bowel sounds are normal.     Palpations: Abdomen is soft.     Tenderness: There is no abdominal tenderness.  Musculoskeletal:     Cervical back: Normal range of motion. No erythema.     Right lower leg: No edema.     Left lower leg: No edema.     Right foot: Tenderness (on plantar aspect mid foot) present.  Lymphadenopathy:     Cervical: No cervical adenopathy.  Skin:    General: Skin is warm and dry.     Capillary Refill: Capillary refill takes less than 2 seconds.      Findings: No rash.  Neurological:     General: No focal deficit present.     Mental Status: She is alert and oriented to person, place, and time.     Cranial Nerves: No cranial nerve deficit.     Sensory: No sensory deficit.     Deep Tendon Reflexes: Reflexes are normal and symmetric.  Psychiatric:        Attention and Perception: Attention normal.        Mood and Affect: Mood normal.    Wt Readings from Last 3 Encounters:  06/16/21 130 lb (59 kg)  06/08/21 127 lb 12.8 oz (58 kg)  04/03/21 128 lb (58.1 kg)    BP (!) 146/84   Pulse 97   Temp 98 F (36.7 C) (Oral)   Ht 5\' 6"  (1.676 m)   Wt 130 lb (59 kg)   SpO2 97%   BMI 20.98 kg/m   Assessment and Plan: 1. Annual physical exam Normal exam Screenings and immunizations are up to date  2. Essential (primary) hypertension Clinically stable exam with well controlled BP. Tolerating medications without side effects at this time. Pt to continue current regimen and low sodium diet; benefits of regular exercise as able discussed. - CBC with Differential/Platelet - Comprehensive metabolic panel - TSH - POCT urinalysis dipstick  3. Benign esophageal stricture No recent symptoms Continue daily PPI  4. Aortic atherosclerosis (HCC) Has declined statin therapy- probably reasonable given her age - Lipid panel  5. Age-related osteoporosis without current pathological fracture Continue calcium and vitamin D Exercise - VITAMIN D 25 Hydroxy (Vit-D Deficiency, Fractures)  6. Plantar fasciitis of right foot Intermittent pain - recommend ice and stretching If worsening, would refer to SM or Ortho   Partially dictated using Editor, commissioning. Any errors are unintentional.  Halina Maidens, MD [facility]  06/16/2021

## 2021-06-17 LAB — COMPREHENSIVE METABOLIC PANEL
ALT: 12 IU/L (ref 0–32)
AST: 17 IU/L (ref 0–40)
Albumin/Globulin Ratio: 1.8 (ref 1.2–2.2)
Albumin: 4.8 g/dL — ABNORMAL HIGH (ref 3.6–4.6)
Alkaline Phosphatase: 43 IU/L — ABNORMAL LOW (ref 44–121)
BUN/Creatinine Ratio: 17 (ref 12–28)
BUN: 12 mg/dL (ref 8–27)
Bilirubin Total: 0.3 mg/dL (ref 0.0–1.2)
CO2: 22 mmol/L (ref 20–29)
Calcium: 9.6 mg/dL (ref 8.7–10.3)
Chloride: 103 mmol/L (ref 96–106)
Creatinine, Ser: 0.72 mg/dL (ref 0.57–1.00)
Globulin, Total: 2.7 g/dL (ref 1.5–4.5)
Glucose: 108 mg/dL — ABNORMAL HIGH (ref 70–99)
Potassium: 3.9 mmol/L (ref 3.5–5.2)
Sodium: 142 mmol/L (ref 134–144)
Total Protein: 7.5 g/dL (ref 6.0–8.5)
eGFR: 83 mL/min/{1.73_m2} (ref >59)

## 2021-06-17 LAB — CBC WITH DIFFERENTIAL/PLATELET
Basophils Absolute: 0.1 10*3/uL (ref 0.0–0.2)
Basos: 1 %
EOS (ABSOLUTE): 0.1 10*3/uL (ref 0.0–0.4)
Eos: 1 %
Hematocrit: 38.7 % (ref 34.0–46.6)
Hemoglobin: 13 g/dL (ref 11.1–15.9)
Immature Grans (Abs): 0 10*3/uL (ref 0.0–0.1)
Immature Granulocytes: 0 %
Lymphocytes Absolute: 2.1 10*3/uL (ref 0.7–3.1)
Lymphs: 39 %
MCH: 30.4 pg (ref 26.6–33.0)
MCHC: 33.6 g/dL (ref 31.5–35.7)
MCV: 90 fL (ref 79–97)
Monocytes Absolute: 0.5 10*3/uL (ref 0.1–0.9)
Monocytes: 10 %
Neutrophils Absolute: 2.6 10*3/uL (ref 1.4–7.0)
Neutrophils: 49 %
Platelets: 362 10*3/uL (ref 150–450)
RBC: 4.28 x10E6/uL (ref 3.77–5.28)
RDW: 12.1 % (ref 11.7–15.4)
WBC: 5.4 10*3/uL (ref 3.4–10.8)

## 2021-06-17 LAB — LIPID PANEL
Chol/HDL Ratio: 2.5 ratio (ref 0.0–4.4)
Cholesterol, Total: 218 mg/dL — ABNORMAL HIGH (ref 100–199)
HDL: 87 mg/dL (ref >39)
LDL Chol Calc (NIH): 119 mg/dL — ABNORMAL HIGH (ref 0–99)
Triglycerides: 68 mg/dL (ref 0–149)
VLDL Cholesterol Cal: 12 mg/dL (ref 5–40)

## 2021-06-17 LAB — VITAMIN D 25 HYDROXY (VIT D DEFICIENCY, FRACTURES): Vit D, 25-Hydroxy: 36.4 ng/mL (ref 30.0–100.0)

## 2021-06-17 LAB — TSH: TSH: 0.837 u[IU]/mL (ref 0.450–4.500)

## 2021-06-24 ENCOUNTER — Other Ambulatory Visit: Payer: Self-pay | Admitting: Internal Medicine

## 2021-06-24 DIAGNOSIS — K222 Esophageal obstruction: Secondary | ICD-10-CM

## 2021-06-24 DIAGNOSIS — I10 Essential (primary) hypertension: Secondary | ICD-10-CM

## 2021-06-24 NOTE — Telephone Encounter (Signed)
Requested Prescriptions  Pending Prescriptions Disp Refills  . amLODipine (NORVASC) 10 MG tablet [Pharmacy Med Name: AMLODIPINE BESYLATE 10MG  TABLETS] 90 tablet 1    Sig: TAKE 1 TABLET BY MOUTH DAILY     Cardiovascular:  Calcium Channel Blockers Failed - 06/24/2021  9:55 AM      Failed - Last BP in normal range    BP Readings from Last 1 Encounters:  06/16/21 (!) 146/84         Passed - Valid encounter within last 6 months    Recent Outpatient Visits          1 week ago Annual physical exam   [facility] Glean Hess, MD   2 months ago Essential (primary) hypertension   [facility] Glean Hess, MD   8 months ago Essential (primary) hypertension   [facility] Glean Hess, MD   8 months ago Palpitations   [facility] Juline Patch, MD   11 months ago Episodic tension-type headache, not intractable   [facility] Glean Hess, MD             . omeprazole (PRILOSEC) 20 MG capsule [Pharmacy Med Name: OMEPRAZOLE 20MG  CAPSULES] 90 capsule 3    Sig: TAKE 1 CAPSULE(20 MG) BY MOUTH DAILY     Gastroenterology: Proton Pump Inhibitors Passed - 06/24/2021  9:55 AM      Passed - Valid encounter within last 12 months    Recent Outpatient Visits          1 week ago Annual physical exam   [facility]an [facility], [facility]   2 months ago Essential (primary) hypertension   [facility] Glean Hess, MD   8 months ago Essential (primary) hypertension   [facility] Glean Hess, MD   8 months ago Palpitations   [facility] Juline Patch, MD   11 months ago Episodic tension-type headache, not intractable   [facility] Glean Hess, MD

## 2021-07-15 DIAGNOSIS — I700 Atherosclerosis of aorta: Secondary | ICD-10-CM | POA: Diagnosis not present

## 2021-07-15 DIAGNOSIS — I10 Essential (primary) hypertension: Secondary | ICD-10-CM | POA: Diagnosis not present

## 2021-09-09 ENCOUNTER — Ambulatory Visit: Payer: Self-pay

## 2021-09-09 NOTE — Telephone Encounter (Signed)
Chief Complaint: Burning in stomach after taking medications Symptoms: Burning in stomach Frequency: Sometimes, mild Pertinent Negatives: Patient denies other symptoms. Disposition: [] ED /[] Urgent Care (no appt availability in office) / [] Appointment(In office/virtual)/ []  [facility]/ [x] Home Care/ [] Refused Recommended Disposition  Additional Notes: Patient wants to know if she need to split up her medications and take some in the morning, some in the evening to prevent the stomach burning. Advised someone will call with Dr. Gaspar Cola recommendation.    Summary: medication questions/concerns   Pt stated she is experiencing a burning feeling after taking her medications. Pt requests call back as she has questions and concerns about the medications. Cb# [phone]      Reason for Disposition  [1] Caller has NON-URGENT medicine question about med that PCP prescribed AND [2] triager unable to answer question  Answer Assessment - Initial Assessment Questions 1. NAME of MEDICATION: "What medicine are you calling about?"     All medications 2. QUESTION: "What is your question?" (e.g., double dose of medicine, side effect)     N/A 3. PRESCRIBING HCP: "Who prescribed it?" Reason: if prescribed by specialist, call should be referred to that group.     N/A 4. SYMPTOMS: "Do you have any symptoms?"     Burning in stomach after taking medications 5. SEVERITY: If symptoms are present, ask "Are they mild, moderate or severe?"     Mild 6. PREGNANCY:  "Is there any chance that you are pregnant?" "When was your last menstrual period?"     No  Protocols used: Medication Question Call-A-AH

## 2021-09-10 NOTE — Telephone Encounter (Signed)
Called pt informed pt to make sure she takes omeprazole daily. Other medications should not have side effect of stomach burning. Pt verbalized understanding.  KP

## 2021-09-10 NOTE — Telephone Encounter (Signed)
Please review. Spoke to pt she stated she takes all of her medication at once in the morning so she doesn't know which medication is causing her stomach to burn.   KP

## 2021-10-17 ENCOUNTER — Other Ambulatory Visit: Payer: Self-pay | Admitting: Internal Medicine

## 2021-10-17 DIAGNOSIS — M542 Cervicalgia: Secondary | ICD-10-CM

## 2021-10-17 NOTE — Telephone Encounter (Signed)
Requested medications are due for refill today.  unsure  Requested medications are on the active medications list.  no  Last refill. 03/25/2021  Future visit scheduled.   yes  Notes to clinic.  Medication was D/c's 06/08/2021.    Requested Prescriptions  Pending Prescriptions Disp Refills   baclofen (LIORESAL) 10 MG tablet [Pharmacy Med Name: BACLOFEN 10MG TABLETS] 30 tablet 1    Sig: TAKE 1 TABLET(10 MG) BY MOUTH AT BEDTIME     Analgesics:  Muscle Relaxants - baclofen Passed - 10/17/2021  3:33 AM      Passed - Cr in normal range and within 180 days    Creatinine  Date Value Ref Range Status  01/22/2014 0.60 0.60 - 1.30 mg/dL Final   Creatinine, Ser  Date Value Ref Range Status  06/16/2021 0.72 0.57 - 1.00 mg/dL Final          Passed - eGFR is 30 or above and within 180 days    EGFR (African American)  Date Value Ref Range Status  01/22/2014 >60  Final   GFR calc Af Amer  Date Value Ref Range Status  10/07/2020 96 >59 mL/min/1.73 Final    Comment:    **In accordance with recommendations from the NKF-ASN Task force,**   [facility] is in the process of updating its eGFR calculation to the   2021 CKD-EPI creatinine equation that estimates kidney function   without a race variable.    EGFR (Non-African Amer.)  Date Value Ref Range Status  01/22/2014 >60  Final    Comment:    eGFR values <70m/min/1.73 m2 may be an indication of chronic kidney disease (CKD). Calculated eGFR is useful in patients with stable renal function. The eGFR calculation will not be reliable in acutely ill patients when serum creatinine is changing rapidly. It is not useful in  patients on dialysis. The eGFR calculation may not be applicable to patients at the low and high extremes of body sizes, pregnant women, and vegetarians.    GFR calc non Af Amer  Date Value Ref Range Status  10/07/2020 84 >59 mL/min/1.73 Final   eGFR  Date Value Ref Range Status  06/16/2021 83 >59 mL/min/1.73 Final           Passed - Valid encounter within last 6 months    Recent Outpatient Visits           4 months [facility]   6 months ago [facility]primary[facility]   1 [facility]   1 [facility]   1 year ago Episodic tension-type headache, not intractable   [facility]

## 2021-11-16 DIAGNOSIS — H40003 Preglaucoma, unspecified, bilateral: Secondary | ICD-10-CM | POA: Diagnosis not present

## 2021-11-19 ENCOUNTER — Other Ambulatory Visit: Payer: Self-pay | Admitting: Internal Medicine

## 2021-11-19 DIAGNOSIS — Z1231 Encounter for screening mammogram for malignant neoplasm of breast: Secondary | ICD-10-CM

## 2021-11-23 ENCOUNTER — Ambulatory Visit: Payer: Self-pay

## 2021-11-23 ENCOUNTER — Other Ambulatory Visit: Payer: Self-pay

## 2021-11-23 NOTE — Telephone Encounter (Signed)
Summary: medication advice  ? Pt was prescribed olmesartan (BENICAR) 20 MG tablet/ but then her cardiologist put her on Valsartan '160Mg'$  tablets / pt is confused about which she should be taking and which she should refill / Pharmacy gave pt 6 tablets to last her until today of the olmesartan (BENICAR) 20 MG tablet/ no she is out and unsure which to refill / please advise   ?  ? ?Chief Complaint: Cardiology told pt. To stop olmesartan and start valsartan in November. States valsartan made her feel dizzy "in my head so I stopped it and went back to olmesartan." Did not follow up with cardiology. Needs refill on olmesartan. States home BP cuff not working. ?Symptoms: n/a ?Frequency: n/a ?Pertinent Negatives: Patient denies  ?Disposition: '[]'$ ED /'[]'$ Urgent Care (no appt availability in office) / '[]'$ Appointment(In office/virtual)/ '[]'$  [place] [facility]/ '[]'$ Home Care/ '[]'$ Refused Recommended Disposition /'[]'$ [place] [facility]/ '[]'$  Follow-up with PCP ?Additional Notes: Asking for refill on olmesartan. Please advise pt.  ?Answer Assessment - Initial Assessment Questions ?1. NAME of MEDICATION: "What medicine are you calling about?" ?    Valsartan ?2. QUESTION: "What is your question?" (e.g., double dose of medicine, side effect) ?    Should I be taking this medicine ?3. PRESCRIBING HCP: "Who prescribed it?" Reason: if prescribed by specialist, call should be referred to that group. ?    Cardiology ?4. SYMPTOMS: "Do you have any symptoms?" ?    Has symptoms from Valsartan - felt dizzy ?5. SEVERITY: If symptoms are present, ask "Are they mild, moderate or severe?" ?    N/a ?6. PREGNANCY:  "Is there any chance that you are pregnant?" "When was your last menstrual period?" ?    No ? ?Protocols used: Medication Question Call-A-AH ? ?

## 2021-11-23 NOTE — Telephone Encounter (Signed)
Patient informed to STOP olmesartan and continue valsartan per cardiology. She verbalized understanding. ?

## 2021-11-26 ENCOUNTER — Ambulatory Visit: Payer: Self-pay | Admitting: *Deleted

## 2021-11-26 NOTE — Telephone Encounter (Signed)
Reason for Disposition ? [1] MODERATE dizziness (e.g., interferes with normal activities) AND [2] has NOT been evaluated by physician for this  (Exception: dizziness caused by heat exposure, sudden standing, or poor fluid intake) ? ?Answer Assessment - Initial Assessment Questions ?1. DESCRIPTION: "Describe your dizziness." ?    When patient goes from sitting to standing ?2. LIGHTHEADED: "Do you feel lightheaded?" (e.g., somewhat faint, woozy, weak upon standing) ?    Off balance ?3. VERTIGO: "Do you feel like either you or the room is spinning or tilting?" (i.e. vertigo) ?    no ?4. SEVERITY: "How bad is it?"  "Do you feel like you are going to faint?" "Can you stand and walk?" ?  - MILD: Feels slightly dizzy, but walking normally. ?  - MODERATE: Feels unsteady when walking, but not falling; interferes with normal activities (e.g., school, work). ?  - SEVERE: Unable to walk without falling, or requires assistance to walk without falling; feels like passing out now.  ?    moderate ?5. ONSET:  "When did the dizziness begin?" ?    Wednesday ?6. AGGRAVATING FACTORS: "Does anything make it worse?" (e.g., standing, change in head position) ?    Just when up moving around ?7. HEART RATE: "Can you tell me your heart rate?" "How many beats in 15 seconds?"  (Note: not all patients can do this)   ?    Not checking ?8. CAUSE: "What do you think is causing the dizziness?" ?    Change in medication ?9. RECURRENT SYMPTOM: "Have you had dizziness before?" If Yes, ask: "When was the last time?" "What happened that time?" ?    no ?10. OTHER SYMPTOMS: "Do you have any other symptoms?" (e.g., fever, chest pain, vomiting, diarrhea, bleeding) ?      no ?11. PREGNANCY: "Is there any chance you are pregnant?" "When was your last menstrual period?" ?      *No Answer* ? ?Protocols used: Dizziness - Lightheadedness-A-AH ? ?

## 2021-11-26 NOTE — Telephone Encounter (Signed)
?  Chief Complaint: new onset dizziness ?Symptoms: patient feels changes in BP medication are making her dizzy ?Frequency: started Wednesday ?Pertinent Negatives: Patient denies fever, chest pain, vomiting, diarrhea, bleeding ?Disposition: '[]'$ ED /'[]'$ Urgent Care (no appt availability in office) / '[x]'$ Appointment(In office/virtual)/ '[]'$  [place] [facility]/ '[]'$ Home Care/ '[]'$ Refused Recommended Disposition /'[]'$ [place] [facility]/ '[]'$  Follow-up with PCP ?Additional Notes:   ? ?

## 2021-11-27 ENCOUNTER — Ambulatory Visit (INDEPENDENT_AMBULATORY_CARE_PROVIDER_SITE_OTHER): Payer: Medicare Other | Admitting: Internal Medicine

## 2021-11-27 ENCOUNTER — Other Ambulatory Visit: Payer: Self-pay

## 2021-11-27 ENCOUNTER — Telehealth: Payer: Self-pay

## 2021-11-27 ENCOUNTER — Encounter: Payer: Self-pay | Admitting: Internal Medicine

## 2021-11-27 VITALS — BP 152/80 | HR 102 | Ht 66.0 in | Wt 127.0 lb

## 2021-11-27 DIAGNOSIS — I10 Essential (primary) hypertension: Secondary | ICD-10-CM | POA: Diagnosis not present

## 2021-11-27 DIAGNOSIS — I700 Atherosclerosis of aorta: Secondary | ICD-10-CM

## 2021-11-27 MED ORDER — SPIRONOLACTONE 25 MG PO TABS: 25.0000 mg | ORAL_TABLET | Freq: Every day | ORAL | 0 refills | Status: DC

## 2021-11-27 MED ORDER — OLMESARTAN MEDOXOMIL 40 MG PO TABS: 40.0000 mg | ORAL_TABLET | Freq: Every day | ORAL | 0 refills | Status: DC

## 2021-11-27 NOTE — Telephone Encounter (Signed)
Patient informed. 

## 2021-11-27 NOTE — Telephone Encounter (Signed)
Copied from [place] [id number]. Topic: General - Other ?>> Nov 27, 2021 12:14 PM Leward Quan A wrote: ?Reason for CRM: [place] patient daughter called in from the pharmacy stated that when they stopped to pick up patients BP medication they inquired if Dr Army Melia will be monitoring patients potassium levels. Marliss Coots would like a call back with an answer please at [phone] ?

## 2021-11-27 NOTE — Progress Notes (Signed)
? ? ?Date:  11/27/2021  ? ?Name:  Kendra Lane   DOB:  February 26, 1935   MRN:  [id number] ? ? ?Chief Complaint: Dizziness ? ?Dizziness ?This is a new problem. The current episode started yesterday. The problem occurs intermittently. The problem has been waxing and waning. Associated symptoms include vertigo. Pertinent negatives include no chest pain, chills, fatigue, fever, headaches, joint swelling, nausea or vomiting.  ?Hypertension ?This is a chronic problem. The problem is uncontrolled. Pertinent negatives include no chest pain, headaches, palpitations or shortness of breath.  ?She was originally on amlodipine 10 mg and benicar 20 mg.  Cardiology had her increase benicar to 40 mg and added chlorthalidone.  Last fall she stopped the chlorthalidone for undocumented reason.  She was then instructed by cardiology to stop benicar and start Valsartan 160 mg.  She took it for a few days last November and felt very off balance so she resumed Benicar 40 mg.  When that Rx ran out a few days ago, she started the Valsartan.  She feels off balance but not dizzy.  Her BP is unchanged. No CP or shortness of breath. ? ?Lab Results  ?Component Value Date  ? NA 142 06/16/2021  ? K 3.9 06/16/2021  ? CO2 22 06/16/2021  ? GLUCOSE 108 (H) 06/16/2021  ? BUN 12 06/16/2021  ? CREATININE 0.72 06/16/2021  ? CALCIUM 9.6 06/16/2021  ? EGFR 83 06/16/2021  ? GFRNONAA 84 10/07/2020  ? ?Lab Results  ?Component Value Date  ? CHOL 218 (H) 06/16/2021  ? HDL 87 06/16/2021  ? LDLCALC 119 (H) 06/16/2021  ? TRIG 68 06/16/2021  ? CHOLHDL 2.5 06/16/2021  ? ?Lab Results  ?Component Value Date  ? TSH 0.837 06/16/2021  ? ?Lab Results  ?Component Value Date  ? HGBA1C 5.7 (H) 06/12/2019  ? ?Lab Results  ?Component Value Date  ? WBC 5.4 06/16/2021  ? HGB 13.0 06/16/2021  ? HCT 38.7 06/16/2021  ? MCV 90 06/16/2021  ? PLT 362 06/16/2021  ? ?Lab Results  ?Component Value Date  ? ALT 12 06/16/2021  ? AST 17 06/16/2021  ? ALKPHOS 43 (L) 06/16/2021  ? BILITOT 0.3  06/16/2021  ? ?Lab Results  ?Component Value Date  ? VD25OH 36.4 06/16/2021  ?  ? ?Review of Systems  ?Constitutional:  Negative for chills, fatigue and fever.  ?Respiratory:  Negative for chest tightness and shortness of breath.   ?Cardiovascular:  Negative for chest pain and palpitations.  ?Gastrointestinal:  Negative for nausea and vomiting.  ?Musculoskeletal:  Negative for joint swelling.  ?Neurological:  Positive for vertigo and light-headedness. Negative for dizziness and headaches.  ?Psychiatric/Behavioral:  Negative for sleep disturbance.   ? ?Patient Active Problem List  ? Diagnosis Date Noted  ? Age-related osteoporosis without current pathological fracture 06/12/2020  ? Aortic atherosclerosis ([place]) 04/02/2020  ? Arthritis of shoulder 11/30/2017  ? Bigeminy 04/07/2016  ? Benign esophageal stricture 01/21/2015  ? Essential (primary) hypertension 01/21/2015  ? Hammer toe 01/21/2015  ? Lipoma of skin and subcutaneous tissue of trunk 01/21/2015  ? ? ?No Known Allergies ? ?Past Surgical History:  ?Procedure Laterality Date  ? ABDOMINAL HYSTERECTOMY    ? CATARACT EXTRACTION W/PHACO Right 07/23/2020  ? Procedure: CATARACT EXTRACTION PHACO AND INTRAOCULAR LENS PLACEMENT (IOC) RIGHT 7.62 01:26.5 8.8%;  Surgeon: Leandrew Koyanagi, MD;  Location: [facility];  Service: Ophthalmology;  Laterality: Right;  ? CATARACT EXTRACTION W/PHACO Left 04/01/2021  ? Procedure: CATARACT EXTRACTION PHACO AND INTRAOCULAR LENS PLACEMENT (IOC) LEFT  3.64 00:56.9;  Surgeon: Leandrew Koyanagi, MD;  Location: [facility];  Service: Ophthalmology;  Laterality: Left;  ? ESOPHAGOGASTRODUODENOSCOPY (EGD) WITH ESOPHAGEAL DILATION  10/2014  ? Dr. Allen Norris  ? TOTAL VAGINAL HYSTERECTOMY  1993  ? TUBAL LIGATION    ? ? ?Social History  ? ?Tobacco Use  ? Smoking status: Never  ? Smokeless tobacco: Never  ? Tobacco comments:  ?  smoking cessation materials not required  ?Vaping Use  ? Vaping Use: Never used  ?Substance Use Topics  ?  Alcohol use: Not Currently  ?  Alcohol/week: 2.0 standard drinks  ?  Types: 2 Cans of beer per week  ?  Comment: last use 08/2019  ? Drug use: No  ? ? ? ?Medication list has been reviewed and updated. ? ?Current Meds  ?Medication Sig  ? amLODipine (NORVASC) 10 MG tablet TAKE 1 TABLET BY MOUTH DAILY  ? aspirin 81 MG tablet Take 81 mg by mouth daily.  ? calcium-vitamin D (OSCAL WITH D) 500-200 MG-UNIT tablet Take 1 tablet by mouth. 600 mg daily  ? metoprolol succinate (TOPROL-XL) 25 MG 24 hr tablet TAKE 2 TABLETS(50 MG) BY MOUTH DAILY  ? Multiple Vitamins-Minerals (CENTRUM SILVER 50+WOMEN PO) Take 1 tablet by mouth daily.  ? omeprazole (PRILOSEC) 20 MG capsule TAKE 1 CAPSULE(20 MG) BY MOUTH DAILY  ? spironolactone (ALDACTONE) 25 MG tablet Take 1 tablet (25 mg total) by mouth daily.  ? valsartan (DIOVAN) 160 MG tablet Take 1 tablet by mouth daily.  ? ? ?PHQ 2/9 Scores 11/27/2021 06/16/2021 06/08/2021 04/03/2021  ?PHQ - 2 Score 4 2 0 0  ?PHQ- 9 Score 8 4 - 4  ? ? ?GAD 7 : Generalized Anxiety Score 11/27/2021 06/16/2021 04/03/2021 10/15/2020  ?Nervous, Anxious, on Edge _0 ?Control/stop worrying _1 ?Worry too much - different things _2 ?Trouble relaxing 0 0 0 0  ?Restless 0 0 0 0  ?Easily annoyed or irritable 0 0 0 1  ?Afraid - awful might happen 0 0 0 0  ?Total GAD 7 Score _3 ?Anxiety Difficulty Not difficult at all - - Not difficult at all  ? ? ?BP Readings from Last 3 Encounters:  ?11/27/21 (!) 152/80  ?06/16/21 (!) 146/84  ?06/08/21 (!) 152/72  ? ? ?Physical Exam ?Constitutional:   ?   Appearance: Normal appearance.  ?Eyes:  ?   Extraocular Movements: Extraocular movements intact.  ?   Conjunctiva/sclera: Conjunctivae normal.  ?Cardiovascular:  ?   Rate and Rhythm: Normal rate and regular rhythm.  ?   Heart sounds: No murmur heard. ?Pulmonary:  ?   Effort: Pulmonary effort is normal. No respiratory distress.  ?   Breath sounds: No stridor.  ?Musculoskeletal:  ?   Cervical back: Normal range of motion.   ?   Right lower leg: No edema.  ?   Left lower leg: No edema.  ?Lymphadenopathy:  ?   Cervical: No cervical adenopathy.  ?Neurological:  ?   Mental Status: She is alert.  ? ? ?Wt Readings from Last 3 Encounters:  ?11/27/21 127 lb (57.6 kg)  ?06/16/21 130 lb (59 kg)  ?06/08/21 127 lb 12.8 oz (58 kg)  ? ? ?BP (!) 152/80 (BP Location: Right Arm)   Pulse (!) 102   Ht _4  (1.676 m)   Wt 127 lb (57.6 kg)   SpO2 97%   BMI 20.50 kg/m?  ? ?Assessment and Plan: ?  1. Essential (primary) hypertension ?Intolerance to Valsartan by history as she has tried it twice. ?Will resume Benicar 40 mg and add spironolactone 25 mg. ?Follow up in 6 weeks with labs ?- olmesartan (BENICAR) 40 MG tablet; Take 1 tablet (40 mg total) by mouth daily.  Dispense: 90 tablet; Refill: 0 ?- spironolactone (ALDACTONE) 25 MG tablet; Take 1 tablet (25 mg total) by mouth daily.  Dispense: 90 tablet; Refill: 0 ? ?2. Aortic atherosclerosis ([place]) ?Not currently on a statin ?She continues daily aspirin ? ? ?Partially dictated using Editor, commissioning. Any errors are unintentional. ? ?Halina Maidens, MD ?[facility] ?[facility] ? ?11/27/2021 ?I ? ? ? ? ?

## 2021-11-27 NOTE — Patient Instructions (Signed)
Stop Valsartan due to balance issues ? ?Start Olmesartan 40 mg once a day and ? ?Spironolactone 25 mg once a day ?

## 2021-12-28 ENCOUNTER — Other Ambulatory Visit: Payer: Self-pay | Admitting: Internal Medicine

## 2021-12-28 DIAGNOSIS — I10 Essential (primary) hypertension: Secondary | ICD-10-CM

## 2021-12-28 NOTE — Telephone Encounter (Signed)
.   Dose inconsistent with current med list, please assess. ?Requested Prescriptions  ?Pending Prescriptions Disp Refills  ?? olmesartan (BENICAR) 20 MG tablet [Pharmacy Med Name: OLMESARTAN MEDOXOMIL '20MG'$  TABLETS] 90 tablet 1  ?  Sig: TAKE 1 TABLET(20 MG) BY MOUTH DAILY  ?  ? Cardiovascular:  Angiotensin Receptor Blockers Failed - 12/28/2021  6:20 AM  ?  ?  Failed - Cr in normal range and within 180 days  ?  Creatinine  ?Date Value Ref Range Status  ?01/22/2014 0.60 0.60 - 1.30 mg/dL Final  ? ?Creatinine, Ser  ?Date Value Ref Range Status  ?06/16/2021 0.72 0.57 - 1.00 mg/dL Final  ?   ?  ?  Failed - K in normal range and within 180 days  ?  Potassium  ?Date Value Ref Range Status  ?06/16/2021 3.9 3.5 - 5.2 mmol/L Final  ?01/22/2014 3.8 3.5 - 5.1 mmol/L Final  ?   ?  ?  Failed - Last BP in normal range  ?  BP Readings from Last 1 Encounters:  ?11/27/21 (!) 152/80  ?   ?  ?  Passed - Patient is not pregnant  ?  ?  Passed - Valid encounter within last 6 months  ?  Recent Outpatient Visits   ?      ? 1 month ago Essential (primary) hypertension  ? [facility] Glean Hess, MD  ? 6 months ago Annual physical exam  ? [facility], [facility]  ? 8 months ago Essential (primary) hypertension  ? [facility] Glean Hess, MD  ? 1 year ago Essential (primary) hypertension  ? [facility] Glean Hess, MD  ? 1 year ago Palpitations  ? [facility] Juline Patch, MD  ?  ?  ?Future Appointments   ?        ? In 2 weeks Army Melia Jesse Sans, MD [facility] [place]  ?  ? ?  ?  ?  ? ? ?

## 2022-01-04 ENCOUNTER — Encounter: Payer: Self-pay | Admitting: Internal Medicine

## 2022-01-04 ENCOUNTER — Ambulatory Visit
Admission: RE | Admit: 2022-01-04 | Discharge: 2022-01-04 | Disposition: A | Discharge status: Home / Self Care | Payer: Medicare Other | Source: Ambulatory Visit | Attending: Internal Medicine | Admitting: Internal Medicine

## 2022-01-04 ENCOUNTER — Ambulatory Visit (INDEPENDENT_AMBULATORY_CARE_PROVIDER_SITE_OTHER): Payer: Medicare Other | Admitting: Internal Medicine

## 2022-01-04 VITALS — BP 136/68 | HR 111 | Ht 66.0 in | Wt 125.0 lb

## 2022-01-04 DIAGNOSIS — F411 Generalized anxiety disorder: Secondary | ICD-10-CM | POA: Diagnosis not present

## 2022-01-04 DIAGNOSIS — I10 Essential (primary) hypertension: Secondary | ICD-10-CM | POA: Diagnosis not present

## 2022-01-04 DIAGNOSIS — Z1231 Encounter for screening mammogram for malignant neoplasm of breast: Secondary | ICD-10-CM | POA: Diagnosis not present

## 2022-01-04 MED ORDER — METOPROLOL SUCCINATE ER 25 MG PO TB24: 78.0000 mg | ORAL_TABLET | Freq: Every day | ORAL | 3 refills | Status: DC

## 2022-01-04 NOTE — Progress Notes (Signed)
? ? ?Date:  01/04/2022  ? ?Name:  Kendra Lane   DOB:  Jul 12, 1935   MRN:  [id number] ? ? ?Chief Complaint: Hypertension and Anxiety ? ?Hypertension ?This is a chronic problem. The current episode started more than 1 year ago. The problem is unchanged. The problem is controlled. Associated symptoms include anxiety. Pertinent negatives include no chest pain, headaches, palpitations or shortness of breath. Past treatments include calcium channel blockers, beta blockers, angiotensin blockers and diuretics. The current treatment provides significant (home readings good) improvement.  ?Anxiety ?Presents for initial visit. Symptoms include nervous/anxious behavior. Patient reports no chest pain, decreased concentration, depressed mood, dizziness, feeling of choking, hyperventilation, irritability, palpitations, panic, shortness of breath or suicidal ideas. Symptoms occur occasionally.  ? ?Past treatments include nothing.  ? ?Lab Results  ?Component Value Date  ? NA 142 06/16/2021  ? K 3.9 06/16/2021  ? CO2 22 06/16/2021  ? GLUCOSE 108 (H) 06/16/2021  ? BUN 12 06/16/2021  ? CREATININE 0.72 06/16/2021  ? CALCIUM 9.6 06/16/2021  ? EGFR 83 06/16/2021  ? GFRNONAA 84 10/07/2020  ? ?Lab Results  ?Component Value Date  ? CHOL 218 (H) 06/16/2021  ? HDL 87 06/16/2021  ? LDLCALC 119 (H) 06/16/2021  ? TRIG 68 06/16/2021  ? CHOLHDL 2.5 06/16/2021  ? ?Lab Results  ?Component Value Date  ? TSH 0.837 06/16/2021  ? ?Lab Results  ?Component Value Date  ? HGBA1C 5.7 (H) 06/12/2019  ? ?Lab Results  ?Component Value Date  ? WBC 5.4 06/16/2021  ? HGB 13.0 06/16/2021  ? HCT 38.7 06/16/2021  ? MCV 90 06/16/2021  ? PLT 362 06/16/2021  ? ?Lab Results  ?Component Value Date  ? ALT 12 06/16/2021  ? AST 17 06/16/2021  ? ALKPHOS 43 (L) 06/16/2021  ? BILITOT 0.3 06/16/2021  ? ?Lab Results  ?Component Value Date  ? VD25OH 36.4 06/16/2021  ?  ? ?Review of Systems  ?Constitutional:  Negative for fatigue, irritability and unexpected weight change.   ?HENT:  Negative for nosebleeds.   ?Eyes:  Negative for visual disturbance.  ?Respiratory:  Negative for cough, chest tightness, shortness of breath and wheezing.   ?Cardiovascular:  Negative for chest pain, palpitations and leg swelling.  ?Gastrointestinal:  Negative for abdominal pain, constipation and diarrhea.  ?Neurological:  Negative for dizziness, weakness, light-headedness and headaches.  ?Psychiatric/Behavioral:  Negative for decreased concentration and suicidal ideas. The patient is nervous/anxious.   ? ?Patient Active Problem List  ? Diagnosis Date Noted  ? Age-related osteoporosis without current pathological fracture 06/12/2020  ? Aortic atherosclerosis ([place]) 04/02/2020  ? Arthritis of shoulder 11/30/2017  ? Bigeminy 04/07/2016  ? Benign esophageal stricture 01/21/2015  ? Essential (primary) hypertension 01/21/2015  ? Hammer toe 01/21/2015  ? Lipoma of skin and subcutaneous tissue of trunk 01/21/2015  ? ? ?No Known Allergies ? ?Past Surgical History:  ?Procedure Laterality Date  ? ABDOMINAL HYSTERECTOMY    ? CATARACT EXTRACTION W/PHACO Right 07/23/2020  ? Procedure: CATARACT EXTRACTION PHACO AND INTRAOCULAR LENS PLACEMENT (IOC) RIGHT 7.62 01:26.5 8.8%;  Surgeon: Leandrew Koyanagi, MD;  Location: [place];  Service: Ophthalmology;  Laterality: Right;  ? CATARACT EXTRACTION W/PHACO Left 04/01/2021  ? Procedure: CATARACT EXTRACTION PHACO AND INTRAOCULAR LENS PLACEMENT (IOC) LEFT 3.64 00:56.9;  Surgeon: Leandrew Koyanagi, MD;  Location: [place];  Service: Ophthalmology;  Laterality: Left;  ? ESOPHAGOGASTRODUODENOSCOPY (EGD) WITH ESOPHAGEAL DILATION  10/2014  ? Dr. Allen Norris  ? TOTAL VAGINAL HYSTERECTOMY  1993  ? TUBAL LIGATION    ? ? ?  Social History  ? ?Tobacco Use  ? Smoking status: Never  ? Smokeless tobacco: Never  ? Tobacco comments:  ?  smoking cessation materials not required  ?Vaping Use  ? Vaping Use: Never used  ?Substance Use Topics  ? Alcohol use: Not Currently  ?   Alcohol/week: 2.0 standard drinks  ?  Types: 2 Cans of beer per week  ?  Comment: last use 08/2019  ? Drug use: No  ? ? ? ?Medication list has been reviewed and updated. ? ?Current Meds  ?Medication Sig  ? amLODipine (NORVASC) 10 MG tablet TAKE 1 TABLET BY MOUTH DAILY  ? aspirin 81 MG tablet Take 81 mg by mouth daily.  ? calcium-vitamin D (OSCAL WITH D) 500-200 MG-UNIT tablet Take 1 tablet by mouth. 600 mg daily  ? Multiple Vitamins-Minerals (CENTRUM SILVER 50+WOMEN PO) Take 1 tablet by mouth daily.  ? olmesartan (BENICAR) 40 MG tablet Take 1 tablet (40 mg total) by mouth daily.  ? omeprazole (PRILOSEC) 20 MG capsule TAKE 1 CAPSULE(20 MG) BY MOUTH DAILY  ? spironolactone (ALDACTONE) 25 MG tablet Take 1 tablet (25 mg total) by mouth daily.  ? [DISCONTINUED] metoprolol succinate (TOPROL-XL) 25 MG 24 hr tablet TAKE 2 TABLETS(50 MG) BY MOUTH DAILY  ? ? ? ?  01/04/2022  ?  1:31 PM 11/27/2021  ? 10:28 AM 06/16/2021  ?  9:26 AM 04/03/2021  ?  3:12 PM  ?GAD 7 : Generalized Anxiety Score  ?Nervous, Anxious, on Edge 3 1 1 1   ?Control/stop worrying 3 1 1 2   ?Worry too much - different things 3 1 1 2   ?Trouble relaxing 2 0 0 0  ?Restless 2 0 0 0  ?Easily annoyed or irritable 2 0 0 0  ?Afraid - awful might happen 2 0 0 0  ?Total GAD 7 Score 17 3 3 5   ?Anxiety Difficulty Somewhat difficult Not difficult at all    ? ? ? ?  01/04/2022  ?  1:30 PM  ?Depression screen PHQ 2/9  ?Decreased Interest 2  ?Down, Depressed, Hopeless 2  ?PHQ - 2 Score 4  ?Altered sleeping 2  ?Tired, decreased energy 3  ?Change in appetite 0  ?Feeling bad or failure about yourself  1  ?Trouble concentrating 0  ?Moving slowly or fidgety/restless 2  ?Suicidal thoughts 0  ?PHQ-9 Score 12  ?Difficult doing work/chores Somewhat difficult  ? ? ?BP Readings from Last 3 Encounters:  ?01/04/22 136/68  ?11/27/21 (!) 152/80  ?06/16/21 (!) 146/84  ? ? ?Physical Exam ?Vitals and nursing note reviewed.  ?Constitutional:   ?   General: She is not in acute distress. ?    Appearance: She is well-developed.  ?HENT:  ?   Head: Normocephalic and atraumatic.  ?Cardiovascular:  ?   Rate and Rhythm: Normal rate and regular rhythm.  ?   Pulses: Normal pulses.  ?   Heart sounds: No murmur heard. ?Pulmonary:  ?   Effort: Pulmonary effort is normal. No respiratory distress.  ?   Breath sounds: No wheezing or rhonchi.  ?Musculoskeletal:  ?   Cervical back: Normal range of motion.  ?   Right lower leg: No edema.  ?   Left lower leg: No edema.  ?Lymphadenopathy:  ?   Cervical: No cervical adenopathy.  ?Skin: ?   General: Skin is warm and dry.  ?   Capillary Refill: Capillary refill takes less than 2 seconds.  ?   Findings: No rash.  ?Neurological:  ?  General: No focal deficit present.  ?   Mental Status: She is alert and oriented to person, place, and time.  ?Psychiatric:     ?   Mood and Affect: Mood normal.     ?   Behavior: Behavior normal.  ? ? ?Wt Readings from Last 3 Encounters:  ?01/04/22 125 lb (56.7 kg)  ?11/27/21 127 lb (57.6 kg)  ?06/16/21 130 lb (59 kg)  ? ? ?BP 136/68 (BP Location: Left Arm, Cuff Size: Large)   Pulse (!) 111   Ht 5' 6"  (1.676 m)   Wt 125 lb (56.7 kg)   SpO2 98%   BMI 20.18 kg/m?  ? ?Assessment and Plan: ?1. Essential (primary) hypertension ?Clinically stable exam with well controlled BP since adding spironolactone. ?Tolerating medications without side effects at this time. ?Pt to continue current regimen and low sodium diet; benefits of regular exercise as able discussed. ?- metoprolol succinate (TOPROL-XL) 25 MG 24 hr tablet; Take 3 tablets (75 mg total) by mouth daily.  Dispense: 270 tablet; Refill: 3 ?- Basic metabolic panel ? ?2. Generalized anxiety disorder ?Mild symptoms not occurring every day. ?At this time she does not feel that her anxiety is interfering with daily activities and she prefers not to take any more medications. ? ? ?Partially dictated using Editor, commissioning. Any errors are unintentional. ? ?Halina Maidens, MD ?[facility] ?[facility] ? ?01/04/2022 ? ? ? ? ? ?

## 2022-01-05 LAB — BASIC METABOLIC PANEL
BUN/Creatinine Ratio: 14 (ref 12–28)
BUN: 11 mg/dL (ref 8–27)
CO2: 23 mmol/L (ref 20–29)
Calcium: 9.5 mg/dL (ref 8.7–10.3)
Chloride: 104 mmol/L (ref 96–106)
Creatinine, Ser: 0.8 mg/dL (ref 0.57–1.00)
Glucose: 96 mg/dL (ref 70–99)
Potassium: 3.8 mmol/L (ref 3.5–5.2)
Sodium: 142 mmol/L (ref 134–144)
eGFR: 73 mL/min/{1.73_m2} (ref >59)

## 2022-01-08 ENCOUNTER — Other Ambulatory Visit: Payer: Self-pay | Admitting: Internal Medicine

## 2022-01-08 DIAGNOSIS — I10 Essential (primary) hypertension: Secondary | ICD-10-CM

## 2022-01-08 NOTE — Telephone Encounter (Signed)
Requested Prescriptions  ?Pending Prescriptions Disp Refills  ?? amLODipine (NORVASC) 10 MG tablet [Pharmacy Med Name: AMLODIPINE BESYLATE '10MG'$  TABLETS] 90 tablet 1  ?  Sig: TAKE 1 TABLET BY MOUTH DAILY  ?  ? Cardiovascular: Calcium Channel Blockers 2 Failed - 01/08/2022  6:21 AM  ?  ?  Failed - Last Heart Rate in normal range  ?  Pulse Readings from Last 1 Encounters:  ?01/04/22 (!) 111  ?   ?  ?  Passed - Last BP in normal range  ?  BP Readings from Last 1 Encounters:  ?01/04/22 136/68  ?   ?  ?  Passed - Valid encounter within last 6 months  ?  Recent Outpatient Visits   ?      ? 4 days ago Essential (primary) hypertension  ? [facility] Glean Hess, MD  ? 1 month ago Essential (primary) hypertension  ? [facility] Glean Hess, MD  ? 6 months ago Annual physical exam  ? [facility] Glean Hess, MD  ? 9 months ago Essential (primary) hypertension  ? [facility] Glean Hess, MD  ? 1 year ago Essential (primary) hypertension  ? [facility] Glean Hess, MD  ?  ?  ?Future Appointments   ?        ? In 4 months Army Melia Jesse Sans, MD [facility] [place]  ?  ? ?  ?  ?  ? ?

## 2022-01-11 ENCOUNTER — Ambulatory Visit: Payer: Medicare Other | Admitting: Internal Medicine

## 2022-01-19 ENCOUNTER — Ambulatory Visit
Admission: EM | Admit: 2022-01-19 | Discharge: 2022-01-19 | Disposition: A | Discharge status: Home / Self Care | Payer: Medicare Other | Attending: Internal Medicine | Admitting: Internal Medicine

## 2022-01-19 ENCOUNTER — Other Ambulatory Visit: Payer: Self-pay

## 2022-01-19 ENCOUNTER — Encounter: Payer: Self-pay | Admitting: Emergency Medicine

## 2022-01-19 ENCOUNTER — Ambulatory Visit (INDEPENDENT_AMBULATORY_CARE_PROVIDER_SITE_OTHER): Payer: Medicare Other

## 2022-01-19 DIAGNOSIS — G8929 Other chronic pain: Secondary | ICD-10-CM | POA: Diagnosis not present

## 2022-01-19 DIAGNOSIS — M25511 Pain in right shoulder: Secondary | ICD-10-CM

## 2022-01-19 MED ORDER — DICLOFENAC SODIUM 1 % EX GEL: 2.0000 g | Freq: Four times a day (QID) | CUTANEOUS | 0 refills | Status: DC

## 2022-01-19 NOTE — Discharge Instructions (Addendum)
Symptoms and exam today are consistent with a right shoulder impingement syndrome, which is mostly positional for you (bothers you if you lay on it at night).  Xray showed some bone spurring, but nothing out of joint or broken.  Prescription for diclofenac gel (anti inflammatory, pain reliever) to rub on the painful area up to 4 times daily, was sent to the pharmacy.  Ice or heat to the painful area might also be helpful, 5-10 minutes as needed.  An orthopedist could advise if a surgery would provide longer lasting relief.   ?

## 2022-01-19 NOTE — ED Triage Notes (Addendum)
Pt c/o right shoulder pain. Started over a year ago. She has not seen ortho. She has been taking tylenol but states it is not getting better. No known injury. Pt states pain is better during the day but at night it gets worse.  ?

## 2022-01-21 NOTE — ED Provider Notes (Signed)
?Kendra Lane ? ? ? ?CSN: [id number] ?Arrival date & time: 01/19/22  0913 ? ? ?  ? ?History   ?Chief Complaint ?Chief Complaint  ?Patient presents with  ? Shoulder Pain  ?  right  ? ? ?HPI ?Kendra Lane is a 87 y.o. female. 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  It is most noticeable sometimes when she rolls onto her right side during sleep, and sometimes when she is moving her right arm.  This is inconsistent thought--the discomfort doesn't always occur if she rolls on her right side or moves her right arm.  She is taking tylenol, which helps some.   ? ? ?Shoulder Pain ? ?Past Medical History:  ?Diagnosis Date  ? Dysrhythmia   ? hx of, ok now  ? GERD (gastroesophageal reflux disease)   ? Headache   ? twice a day sometimes  ? Hypertension   ? Neck pain   ? Shoulder pain, left   ? ? ?Patient Active Problem List  ? Diagnosis Date Noted  ? Age-related osteoporosis without current pathological fracture 06/12/2020  ? Aortic atherosclerosis ([place]) 04/02/2020  ? Arthritis of shoulder 11/30/2017  ? Bigeminy 04/07/2016  ? Benign esophageal stricture 01/21/2015  ? Essential (primary) hypertension 01/21/2015  ? Hammer toe 01/21/2015  ? Lipoma of skin and subcutaneous tissue of trunk 01/21/2015  ? ? ?Past Surgical History:  ?Procedure Laterality Date  ? ABDOMINAL HYSTERECTOMY    ? CATARACT EXTRACTION W/PHACO Right 07/23/2020  ? Procedure: CATARACT EXTRACTION PHACO AND INTRAOCULAR LENS PLACEMENT (IOC) RIGHT 7.62 01:26.5 8.8%;  Surgeon: Leandrew Koyanagi, MD;  Location: [place];  Service: Ophthalmology;  Laterality: Right;  ? CATARACT EXTRACTION W/PHACO Left 04/01/2021  ? Procedure: CATARACT EXTRACTION PHACO AND INTRAOCULAR LENS PLACEMENT (IOC) LEFT 3.64 00:56.9;  Surgeon: Leandrew Koyanagi, MD;  Location: [place];  Service: Ophthalmology;  Laterality: Left;  ?  ESOPHAGOGASTRODUODENOSCOPY (EGD) WITH ESOPHAGEAL DILATION  10/2014  ? Dr. Allen Norris  ? TOTAL VAGINAL HYSTERECTOMY  1993  ? TUBAL LIGATION    ? ? ? ? ?Home Medications   ? ?Prior to Admission medications   ?Medication Sig Start Date End Date Taking? Authorizing Provider  ?amLODipine (NORVASC) 10 MG tablet TAKE 1 TABLET BY MOUTH DAILY 01/08/22  Yes Glean Hess, MD  ?aspirin 81 MG tablet Take 81 mg by mouth daily.   Yes [provider]  ?calcium-vitamin D (OSCAL WITH D) 500-200 MG-UNIT tablet Take 1 tablet by mouth. 600 mg daily   Yes [provider]  ?diclofenac Sodium (VOLTAREN) 1 % GEL Apply 2 g topically 4 (four) times daily. 01/19/22  Yes Wynona Luna, MD  ?metoprolol succinate (TOPROL-XL) 25 MG 24 hr tablet Take 3 tablets (75 mg total) by mouth daily. 01/04/22  Yes Glean Hess, MD  ?Multiple Vitamins-Minerals (CENTRUM SILVER 50+WOMEN PO) Take 1 tablet by mouth daily.   Yes [provider]  ?olmesartan (BENICAR) 40 MG tablet Take 1 tablet (40 mg total) by mouth daily. 11/27/21  Yes Glean Hess, MD  ?omeprazole (PRILOSEC) 20 MG capsule TAKE 1 CAPSULE(20 MG) BY MOUTH DAILY 06/24/21  Yes Glean Hess, MD  ?spironolactone (ALDACTONE) 25 MG tablet Take 1 tablet (25 mg total) by mouth daily. 11/27/21  Yes Glean Hess, MD  ? ? ?Family History ?Family History  ?Problem Relation Age of Onset  ? Hypertension Mother   ?  Other Father   ?     "old age"  ? Breast cancer Daughter 27  ? ? ?Social History ?Social History  ? ?Tobacco Use  ? Smoking status: Never  ? Smokeless tobacco: Never  ? Tobacco comments:  ?  smoking cessation materials not required  ?Vaping Use  ? Vaping Use: Never used  ?Substance Use Topics  ? Alcohol use: Not Currently  ?  Alcohol/week: 2.0 standard drinks  ?  Types: 2 Cans of beer per week  ?  Comment: last use 08/2019  ? Drug use: No  ? ? ? ?Allergies   ?Patient has no known allergies. ? ? ?Review of Systems ?Review of Systems  see HPI ? ? ?Physical  Exam ?Triage Vital Signs ?ED Triage Vitals  ?Enc Vitals Group  ?   BP 01/19/22 1104 (!) 169/86  ?   Pulse Rate 01/19/22 1104 80  ?   Resp 01/19/22 1104 16  ?   Temp 01/19/22 1104 98.2 ?F (36.8 ?C)  ?   Temp Source 01/19/22 1104 Oral  ?   SpO2 01/19/22 1104 97 %  ?   Weight 01/19/22 1102 125 lb (56.7 kg)  ?   Height 01/19/22 1102 '5\' 6"'$  (1.676 m)  ?   Pain Score 01/19/22 1101 8  ?   Pain Loc --   ? ? ?Updated Vital Signs ?BP (!) 169/86 (BP Location: Right Arm)   Pulse 80   Temp 98.2 ?F (36.8 ?C) (Oral)   Resp 16   Ht '5\' 6"'$  (1.676 m)   Wt 56.7 kg   SpO2 97%   BMI 20.18 kg/m?  ? ?Physical Exam ?Constitutional:   ?   General: She is not in acute distress. ?   Appearance: She is not ill-appearing or toxic-appearing.  ?   Comments: Good hygiene  ?HENT:  ?   Head: Atraumatic.  ?   Mouth/Throat:  ?   Mouth: Mucous membranes are moist.  ?Eyes:  ?   Conjunctiva/sclera:  ?   Right eye: Right conjunctiva is not injected. No exudate. ?   Left eye: Left conjunctiva is not injected. No exudate. ?   Comments: Conjugate gaze observed  ?Neck:  ?   Comments: Turns head freely ?Cardiovascular:  ?   Rate and Rhythm: Normal rate.  ?Pulmonary:  ?   Effort: Pulmonary effort is normal. No respiratory distress.  ?Abdominal:  ?   General: There is no distension.  ?Musculoskeletal:  ?     Arms: ? ?   Comments: Red spot marks location of pain ?Able to fully extend overhead both arms at shoulders, and internally/externally rotate.  A little uncomfortable to adduct right arm across the front of the body. Mild discomfort with deep palpation of the shoulder where diagram is marked--not bruised/swollen/red. ?Walked into the urgent care independently.  ?Skin: ?   General: Skin is warm and dry.  ?   Comments: No cyanosis  ?Neurological:  ?   Mental Status: She is alert.  ?   Comments: Face symmetric, speech clear/coherent/logical  ? ? ? ?UC Treatments / Results  ?Labs ?(all labs ordered are listed, but only abnormal results are displayed) ?Labs  Reviewed - No data to display ?NA ? ?EKG ?NA ? ?Radiology ?EXAM: ?RIGHT SHOULDER - 2+ VIEW ?  ?COMPARISON:  07/13/2017 ?  ?FINDINGS: ?Inferior acromion spurring with subacromial narrowing that appears ?more prominent than before, although changes could be projectional. ?Located glenohumeral and acromioclavicular joints. No detected ?glenohumeral joint space narrowing.  Subjective generalized ?osteopenia. ?  ?IMPRESSION: ?Subacromial spurring which may have progressed from 2018. No acute ?finding. ?  ?  ?Electronically Signed ?  By: Jorje Guild M.D. ?  On: 01/19/2022 11:48 ? ?Procedures ?Procedures (including critical care time) ?NA ? ?Medications Ordered in UC ?Medications - No data to display ?NA ? ? ?Final Clinical Impressions(s) / UC Diagnoses  ? ?Final diagnoses:  ?Chronic right shoulder pain  ? ? ? ?Discharge Instructions   ? ?  ?Symptoms and exam today are consistent with a right shoulder impingement syndrome, which is mostly positional for you (bothers you if you lay on it at night).  Xray showed some bone spurring, but nothing out of joint or broken.  Prescription for diclofenac gel (anti inflammatory, pain reliever) to rub on the painful area up to 4 times daily, was sent to the pharmacy.  Ice or heat to the painful area might also be helpful, 5-10 minutes as needed.  An orthopedist could advise if a surgery would provide longer lasting relief.   ? ? ? ?ED Prescriptions   ? ? Medication Sig Dispense Auth. Provider  ? diclofenac Sodium (VOLTAREN) 1 % GEL Apply 2 g topically 4 (four) times daily. 50 g Wynona Luna, MD  ? ?  ? ?PDMP not reviewed this encounter. ?  ?Wynona Luna, MD ?01/22/22 1418 ? ?

## 2022-02-17 ENCOUNTER — Other Ambulatory Visit: Payer: Self-pay | Admitting: Internal Medicine

## 2022-02-17 DIAGNOSIS — I10 Essential (primary) hypertension: Secondary | ICD-10-CM

## 2022-02-17 NOTE — Telephone Encounter (Signed)
Requested Prescriptions  Pending Prescriptions Disp Refills  . spironolactone (ALDACTONE) 25 MG tablet [Pharmacy Med Name: SPIRONOLACTONE 25MG TABLETS] 90 tablet 0    Sig: TAKE 1 TABLET(25 MG) BY MOUTH DAILY     Cardiovascular: Diuretics - Aldosterone Antagonist Failed - 02/17/2022 11:20 AM      Failed - Last BP in normal range    BP Readings from Last 1 Encounters:  01/19/22 (!) 169/86         Passed - Cr in normal range and within 180 days    Creatinine  Date Value Ref Range Status  01/22/2014 0.60 0.60 - 1.30 mg/dL Final   Creatinine, Ser  Date Value Ref Range Status  01/04/2022 0.80 0.57 - 1.00 mg/dL Final         Passed - K in normal range and within 180 days    Potassium  Date Value Ref Range Status  01/04/2022 3.8 3.5 - 5.2 mmol/L Final  01/22/2014 3.8 3.5 - 5.1 mmol/L Final         Passed - Na in normal range and within 180 days    Sodium  Date Value Ref Range Status  01/04/2022 142 134 - 144 mmol/L Final  01/22/2014 141 136 - 145 mmol/L Final         Passed - eGFR is 30 or above and within 180 days    EGFR (African American)  Date Value Ref Range Status  01/22/2014 >60  Final   GFR calc Af Amer  Date Value Ref Range Status  10/07/2020 96 >59 mL/min/1.73 Final    Comment:    **In accordance with recommendations from the NKF-ASN Task force,**   [facility] is in the process of updating its eGFR calculation to the   2021 CKD-EPI creatinine equation that estimates kidney function   without a race variable.    EGFR (Non-African Amer.)  Date Value Ref Range Status  01/22/2014 >60  Final    Comment:    eGFR values <16m/min/1.73 m2 may be an indication of chronic kidney disease (CKD). Calculated eGFR is useful in patients with stable renal function. The eGFR calculation will not be reliable in acutely ill patients when serum creatinine is changing rapidly. It is not useful in  patients on dialysis. The eGFR calculation may not be applicable to patients at the  low and high extremes of body sizes, pregnant women, and vegetarians.    GFR calc non Af Amer  Date Value Ref Range Status  10/07/2020 84 >59 mL/min/1.73 Final   eGFR  Date Value Ref Range Status  01/04/2022 73 >59 mL/min/1.73 Final         Passed - Valid encounter within last 6 months    Recent Outpatient Visits          [facility]   2 months [facility]   8 months [facility]   10 months [facility]   1 year ago [facility]primary[facility]      Future Appointments            In 2 months BArmy MeliaLJesse Sans [facility]

## 2022-02-18 ENCOUNTER — Other Ambulatory Visit: Payer: Self-pay | Admitting: Internal Medicine

## 2022-02-18 DIAGNOSIS — M542 Cervicalgia: Secondary | ICD-10-CM

## 2022-02-18 NOTE — Telephone Encounter (Signed)
Requested medication (s) are due for refill today: yes  Requested medication (s) are on the active medication list: no  Last refill:  09/17/21   Future visit scheduled: yes  Notes to clinic:  rx was dc on 06/08/21. Please assess for refill    Requested Prescriptions  Pending Prescriptions Disp Refills   baclofen (LIORESAL) 10 MG tablet [Pharmacy Med Name: BACLOFEN 10MG TABLETS] 30 tablet 1    Sig: TAKE 1 TABLET(10 MG) BY MOUTH AT BEDTIME     Analgesics:  Muscle Relaxants - baclofen Passed - 02/18/2022 11:42 AM      Passed - Cr in normal range and within 180 days    Creatinine  Date Value Ref Range Status  01/22/2014 0.60 0.60 - 1.30 mg/dL Final   Creatinine, Ser  Date Value Ref Range Status  01/04/2022 0.80 0.57 - 1.00 mg/dL Final         Passed - eGFR is 30 or above and within 180 days    EGFR (African American)  Date Value Ref Range Status  01/22/2014 >60  Final   GFR calc Af Amer  Date Value Ref Range Status  10/07/2020 96 >59 mL/min/1.73 Final    Comment:    **In accordance with recommendations from the NKF-ASN Task force,**   [facility] is in the process of updating its eGFR calculation to the   2021 CKD-EPI creatinine equation that estimates kidney function   without a race variable.    EGFR (Non-African Amer.)  Date Value Ref Range Status  01/22/2014 >60  Final    Comment:    eGFR values <49m/min/1.73 m2 may be an indication of chronic kidney disease (CKD). Calculated eGFR is useful in patients with stable renal function. The eGFR calculation will not be reliable in acutely ill patients when serum creatinine is changing rapidly. It is not useful in  patients on dialysis. The eGFR calculation may not be applicable to patients at the low and high extremes of body sizes, pregnant women, and vegetarians.    GFR calc non Af Amer  Date Value Ref Range Status  10/07/2020 84 >59 mL/min/1.73 Final   eGFR  Date Value Ref Range Status  01/04/2022 73 >59 mL/min/1.73  Final         Passed - Valid encounter within last 6 months    Recent Outpatient Visits           1 month ago [facility]primary) [facility]   2 months ago [facility]   8 months ago [facility]   10 months ago [facility]primary[facility]   1 year ago [facility]primary[facility]       Future Appointments             In 2 months [facility]

## 2022-02-23 ENCOUNTER — Other Ambulatory Visit: Payer: Self-pay | Admitting: Internal Medicine

## 2022-02-23 DIAGNOSIS — I10 Essential (primary) hypertension: Secondary | ICD-10-CM

## 2022-02-24 NOTE — Telephone Encounter (Signed)
Requested Prescriptions  Pending Prescriptions Disp Refills  . olmesartan (BENICAR) 40 MG tablet [Pharmacy Med Name: OLMESARTAN MEDOXOMIL '40MG'$  TABLETS] 90 tablet 0    Sig: TAKE 1 TABLET(40 MG) BY MOUTH DAILY     Cardiovascular:  Angiotensin Receptor Blockers Failed - 02/23/2022  3:58 PM      Failed - Last BP in normal range    BP Readings from Last 1 Encounters:  01/19/22 (!) 169/86         Passed - Cr in normal range and within 180 days    Creatinine  Date Value Ref Range Status  01/22/2014 0.60 0.60 - 1.30 mg/dL Final   Creatinine, Ser  Date Value Ref Range Status  01/04/2022 0.80 0.57 - 1.00 mg/dL Final         Passed - K in normal range and within 180 days    Potassium  Date Value Ref Range Status  01/04/2022 3.8 3.5 - 5.2 mmol/L Final  01/22/2014 3.8 3.5 - 5.1 mmol/L Final         Passed - Patient is not pregnant      Passed - Valid encounter within last 6 months    Recent Outpatient Visits          1 month ago Essential (primary) hypertension   [facility] Glean Hess, MD   2 months ago Essential (primary) hypertension   [facility] Glean Hess, MD   8 months ago Annual physical exam   [facility] Glean Hess, MD   10 months ago Essential (primary) hypertension   [facility] Glean Hess, MD   1 year ago Essential (primary) hypertension   [facility] Glean Hess, MD      Future Appointments            In 2 months Army Melia Jesse Sans, MD [facility]

## 2022-05-03 DIAGNOSIS — M79674 Pain in right toe(s): Secondary | ICD-10-CM | POA: Diagnosis not present

## 2022-05-03 DIAGNOSIS — M2011 Hallux valgus (acquired), right foot: Secondary | ICD-10-CM | POA: Diagnosis not present

## 2022-05-03 DIAGNOSIS — M79675 Pain in left toe(s): Secondary | ICD-10-CM | POA: Diagnosis not present

## 2022-05-03 DIAGNOSIS — M2041 Other hammer toe(s) (acquired), right foot: Secondary | ICD-10-CM | POA: Diagnosis not present

## 2022-05-03 DIAGNOSIS — B351 Tinea unguium: Secondary | ICD-10-CM | POA: Diagnosis not present

## 2022-05-10 ENCOUNTER — Other Ambulatory Visit
Admission: RE | Admit: 2022-05-10 | Discharge: 2022-05-10 | Disposition: A | Discharge status: Home / Self Care | Payer: Medicare Other | Attending: Internal Medicine | Admitting: Internal Medicine

## 2022-05-10 ENCOUNTER — Ambulatory Visit (INDEPENDENT_AMBULATORY_CARE_PROVIDER_SITE_OTHER): Payer: Medicare Other | Admitting: Internal Medicine

## 2022-05-10 ENCOUNTER — Encounter: Payer: Self-pay | Admitting: Internal Medicine

## 2022-05-10 VITALS — BP 132/60 | HR 102 | Ht 66.0 in | Wt 125.0 lb

## 2022-05-10 DIAGNOSIS — I700 Atherosclerosis of aorta: Secondary | ICD-10-CM

## 2022-05-10 DIAGNOSIS — R7303 Prediabetes: Secondary | ICD-10-CM

## 2022-05-10 DIAGNOSIS — I10 Essential (primary) hypertension: Secondary | ICD-10-CM

## 2022-05-10 LAB — BASIC METABOLIC PANEL
Anion gap: 7 (ref 5–15)
BUN: 13 mg/dL (ref 8–23)
CO2: 27 mmol/L (ref 22–32)
Calcium: 9.2 mg/dL (ref 8.9–10.3)
Chloride: 104 mmol/L (ref 98–111)
Creatinine, Ser: 0.66 mg/dL (ref 0.44–1.00)
GFR, Estimated: >60 mL/min (ref >60)
Glucose, Bld: 108 mg/dL — ABNORMAL HIGH (ref 70–99)
Potassium: 3.6 mmol/L (ref 3.5–5.1)
Sodium: 138 mmol/L (ref 135–145)

## 2022-05-10 MED ORDER — SPIRONOLACTONE 25 MG PO TABS: ORAL_TABLET | ORAL | 3 refills | Status: DC

## 2022-05-10 NOTE — Progress Notes (Signed)
Date:  05/10/2022   Name:  Kendra Lane   DOB:  Dec 26, 1934   MRN:  [id number]   Chief Complaint: Hypertension  Hypertension This is a chronic problem. The problem is controlled. Pertinent negatives include no chest pain, headaches, palpitations or shortness of breath. Past treatments include beta blockers, diuretics, calcium channel blockers and angiotensin blockers. The current treatment provides significant improvement. There are no compliance problems.  Hypertensive end-organ damage includes CAD/MI. There is no history of kidney disease or CVA. There is no history of chronic renal disease.  Hyperlipidemia This is a chronic problem. The problem is uncontrolled. She has no history of chronic renal disease. There are no known factors aggravating her hyperlipidemia. Pertinent negatives include no chest pain or shortness of breath. Current antihyperlipidemic treatment includes diet change.    Lab Results  Component Value Date   NA 142 01/04/2022   K 3.8 01/04/2022   CO2 23 01/04/2022   GLUCOSE 96 01/04/2022   BUN 11 01/04/2022   CREATININE 0.80 01/04/2022   CALCIUM 9.5 01/04/2022   EGFR 73 01/04/2022   GFRNONAA 84 10/07/2020   Lab Results  Component Value Date   CHOL 218 (H) 06/16/2021   HDL 87 06/16/2021   LDLCALC 119 (H) 06/16/2021   TRIG 68 06/16/2021   CHOLHDL 2.5 06/16/2021   Lab Results  Component Value Date   TSH 0.837 06/16/2021   Lab Results  Component Value Date   HGBA1C 5.7 (H) 06/12/2019   Lab Results  Component Value Date   WBC 5.4 06/16/2021   HGB 13.0 06/16/2021   HCT 38.7 06/16/2021   MCV 90 06/16/2021   PLT 362 06/16/2021   Lab Results  Component Value Date   ALT 12 06/16/2021   AST 17 06/16/2021   ALKPHOS 43 (L) 06/16/2021   BILITOT 0.3 06/16/2021   Lab Results  Component Value Date   VD25OH 36.4 06/16/2021     Review of Systems  Constitutional:  Negative for fatigue and unexpected weight change.  HENT:  Negative for nosebleeds.    Eyes:  Negative for visual disturbance.  Respiratory:  Negative for cough, chest tightness, shortness of breath and wheezing.   Cardiovascular:  Negative for chest pain, palpitations and leg swelling.  Gastrointestinal:  Negative for abdominal pain, constipation and diarrhea.  Neurological:  Negative for dizziness, weakness, light-headedness and headaches.    Patient Active Problem List   Diagnosis Date Noted   Age-related osteoporosis without current pathological fracture 06/12/2020   Aortic atherosclerosis ([place]) 04/02/2020   Arthritis of shoulder 11/30/2017   Bigeminy 04/07/2016   Benign esophageal stricture 01/21/2015   Essential (primary) hypertension 01/21/2015   Hammer toe 01/21/2015   Lipoma of skin and subcutaneous tissue of trunk 01/21/2015    No Known Allergies  Past Surgical History:  Procedure Laterality Date   ABDOMINAL HYSTERECTOMY     CATARACT EXTRACTION W/PHACO Right 07/23/2020   Procedure: CATARACT EXTRACTION PHACO AND INTRAOCULAR LENS PLACEMENT (IOC) RIGHT 7.62 01:26.5 8.8%;  Surgeon: Leandrew Koyanagi, MD;  Location: [place];  Service: Ophthalmology;  Laterality: Right;   CATARACT EXTRACTION W/PHACO Left 04/01/2021   Procedure: CATARACT EXTRACTION PHACO AND INTRAOCULAR LENS PLACEMENT (IOC) LEFT 3.64 00:56.9;  Surgeon: Leandrew Koyanagi, MD;  Location: [place];  Service: Ophthalmology;  Laterality: Left;   ESOPHAGOGASTRODUODENOSCOPY (EGD) WITH ESOPHAGEAL DILATION  10/2014   Dr. Allen Norris   TOTAL VAGINAL HYSTERECTOMY  1993   TUBAL LIGATION      Social History  Tobacco Use   Smoking status: Never   Smokeless tobacco: Never   Tobacco comments:    smoking cessation materials not required  Vaping Use   Vaping Use: Never used  Substance Use Topics   Alcohol use: Not Currently    Alcohol/week: 2.0 standard drinks of alcohol    Types: 2 Cans of beer per week    Comment: last use 08/2019   Drug use: No     Medication list has been  reviewed and updated.  Current Meds  Medication Sig   amLODipine (NORVASC) 10 MG tablet TAKE 1 TABLET BY MOUTH DAILY   aspirin 81 MG tablet Take 81 mg by mouth daily.   calcium-vitamin D (OSCAL WITH D) 500-200 MG-UNIT tablet Take 1 tablet by mouth. 600 mg daily   diclofenac Sodium (VOLTAREN) 1 % GEL Apply 2 g topically 4 (four) times daily.   metoprolol succinate (TOPROL-XL) 25 MG 24 hr tablet Take 3 tablets (75 mg total) by mouth daily.   Multiple Vitamins-Minerals (CENTRUM SILVER 50+WOMEN PO) Take 1 tablet by mouth daily.   olmesartan (BENICAR) 40 MG tablet TAKE 1 TABLET(40 MG) BY MOUTH DAILY   omeprazole (PRILOSEC) 20 MG capsule TAKE 1 CAPSULE(20 MG) BY MOUTH DAILY   [DISCONTINUED] spironolactone (ALDACTONE) 25 MG tablet TAKE 1 TABLET(25 MG) BY MOUTH DAILY       05/10/2022   10:54 AM 01/04/2022    1:31 PM 11/27/2021   10:28 AM 06/16/2021    9:26 AM  GAD 7 : Generalized Anxiety Score  Nervous, Anxious, on Edge 1 3 1 1   Control/stop worrying 0 3 1 1   Worry too much - different things 2 3 1 1   Trouble relaxing 0 2 0 0  Restless 0 2 0 0  Easily annoyed or irritable 1 2 0 0  Afraid - awful might happen 0 2 0 0  Total GAD 7 Score 4 17 3 3   Anxiety Difficulty Somewhat difficult Somewhat difficult Not difficult at all        05/10/2022   10:54 AM 01/04/2022    1:30 PM 11/27/2021   10:27 AM  Depression screen PHQ 2/9  Decreased Interest 0 2 2  Down, Depressed, Hopeless 0 2 2  PHQ - 2 Score 0 4 4  Altered sleeping 0 2 3  Tired, decreased energy 0 3 1  Change in appetite 0 0 0  Feeling bad or failure about yourself  0 1 0  Trouble concentrating 0 0 0  Moving slowly or fidgety/restless 0 2 0  Suicidal thoughts 0 0 0  PHQ-9 Score 0 12 8  Difficult doing work/chores Not difficult at all Somewhat difficult Somewhat difficult    BP Readings from Last 3 Encounters:  05/10/22 132/60  01/19/22 (!) 169/86  01/04/22 136/68    Physical Exam Vitals and nursing note reviewed.   Constitutional:      General: She is not in acute distress.    Appearance: She is well-developed.  HENT:     Head: Normocephalic and atraumatic.  Cardiovascular:     Rate and Rhythm: Frequent Extrasystoles are present.    Heart sounds: Normal heart sounds.  Pulmonary:     Effort: Pulmonary effort is normal. No respiratory distress.  Musculoskeletal:     Right lower leg: No edema.     Left lower leg: No edema.  Skin:    General: Skin is warm and dry.     Findings: No rash.  Neurological:     Mental  Status: She is alert and oriented to person, place, and time.  Psychiatric:        Mood and Affect: Mood normal.        Behavior: Behavior normal.     Wt Readings from Last 3 Encounters:  05/10/22 125 lb (56.7 kg)  01/19/22 125 lb (56.7 kg)  01/04/22 125 lb (56.7 kg)    BP 132/60 (BP Location: Right Arm, Cuff Size: Normal)   Pulse (!) 102   Ht 5' 6"  (1.676 m)   Wt 125 lb (56.7 kg)   SpO2 98%   BMI 20.18 kg/m   Assessment and Plan: 1. Essential (primary) hypertension Clinically stable exam with well controlled BP. Tolerating medications without side effects at this time. Pt to continue current regimen and low sodium diet; benefits of regular exercise as able discussed. - Basic metabolic panel - spironolactone (ALDACTONE) 25 MG tablet; TAKE 1 TABLET(25 MG) BY MOUTH DAILY  Dispense: 90 tablet; Refill: 3  2. Aortic atherosclerosis (HCC) Continue ASA 81 mg daily without statin medications at this time. Seen last year by Cardiology  3. Prediabetes Continue low carb diet Will check labs at next visit.   Partially dictated using Editor, commissioning. Any errors are unintentional.  Halina Maidens, MD [facility]  05/10/2022

## 2022-05-24 ENCOUNTER — Other Ambulatory Visit: Payer: Self-pay | Admitting: Internal Medicine

## 2022-05-24 DIAGNOSIS — I10 Essential (primary) hypertension: Secondary | ICD-10-CM

## 2022-05-26 NOTE — Telephone Encounter (Signed)
Requested Prescriptions  Pending Prescriptions Disp Refills  . olmesartan (BENICAR) 40 MG tablet [Pharmacy Med Name: OLMESARTAN MEDOXOMIL '40MG'$  TABLETS] 90 tablet 1    Sig: TAKE 1 TABLET(40 MG) BY MOUTH DAILY     Cardiovascular:  Angiotensin Receptor Blockers Passed - 05/24/2022  7:00 PM      Passed - Cr in normal range and within 180 days    Creatinine  Date Value Ref Range Status  01/22/2014 0.60 0.60 - 1.30 mg/dL Final   Creatinine, Ser  Date Value Ref Range Status  05/10/2022 0.66 0.44 - 1.00 mg/dL Final         Passed - K in normal range and within 180 days    Potassium  Date Value Ref Range Status  05/10/2022 3.6 3.5 - 5.1 mmol/L Final  01/22/2014 3.8 3.5 - 5.1 mmol/L Final         Passed - Patient is not pregnant      Passed - Last BP in normal range    BP Readings from Last 1 Encounters:  05/10/22 132/60         Passed - Valid encounter within last 6 months    Recent Outpatient Visits          2 weeks ago Essential (primary) hypertension   [facility], Jesse Sans, MD   4 months ago Essential (primary) hypertension   [place] [facility] at [facility], Jesse Sans, MD   6 months ago Essential (primary) hypertension   [place] [facility] at [facility], Jesse Sans, MD   11 months ago Annual physical exam   [facility] at [facility], Jesse Sans, MD   1 year ago Essential (primary) hypertension    [facility] Medicine at [facility], Jesse Sans, MD      Future Appointments            In 3 months Army Melia, Jesse Sans, MD [place] [facility] at [facility], [facility]

## 2022-06-06 IMAGING — CR DG SHOULDER 2+V*R*
3 series · 3 of 3 positions shown · non-contrast
Comparison: 07/13/2017

CLINICAL DATA: On and off right shoulder pain for 1 year

EXAM:
RIGHT SHOULDER - 2+ VIEW

[shoulder grashey]
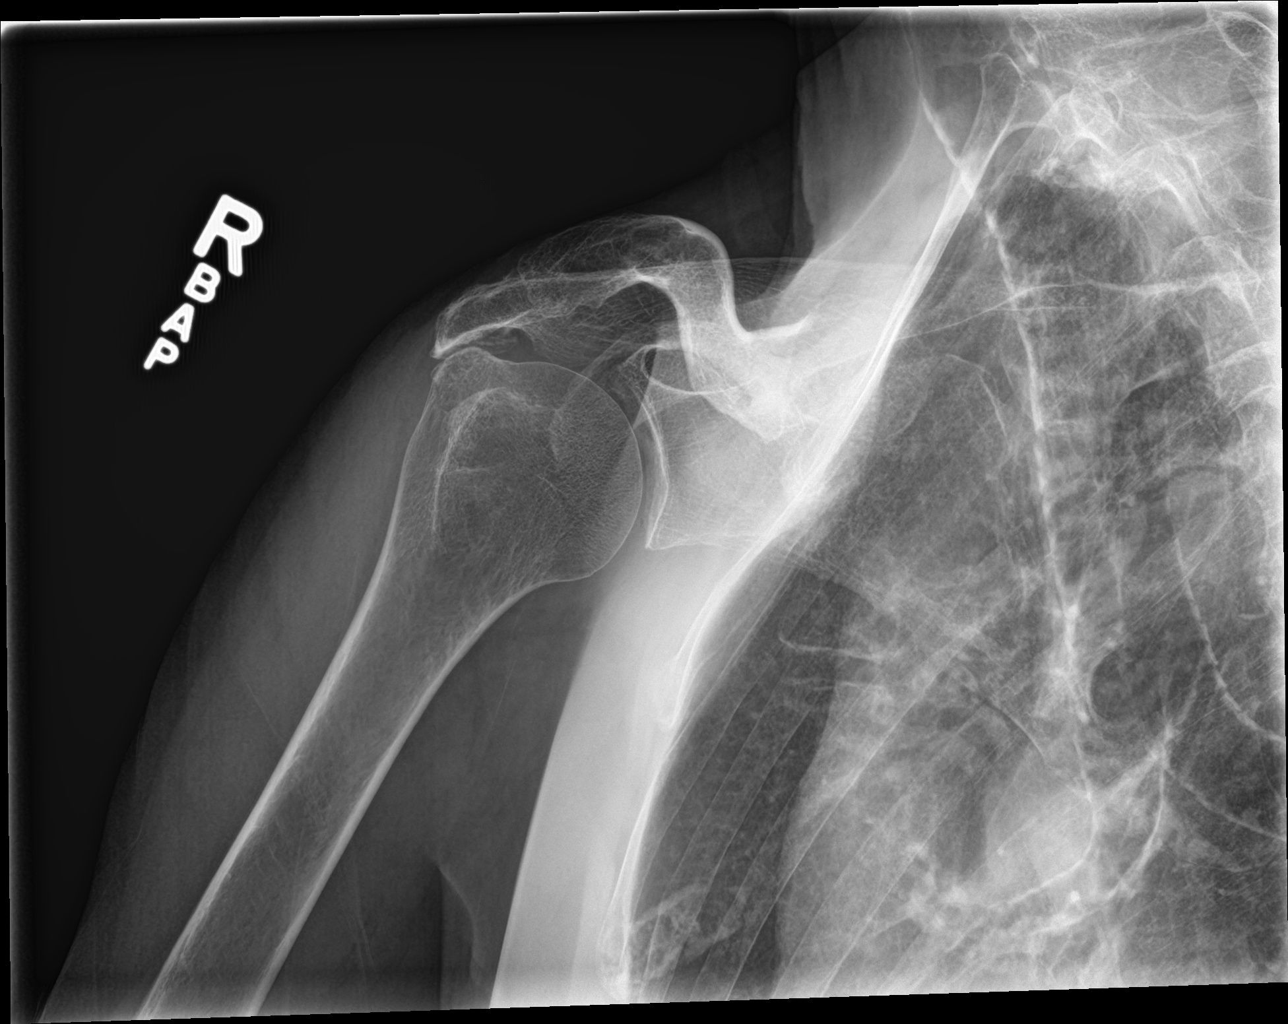

[shoulder y view]
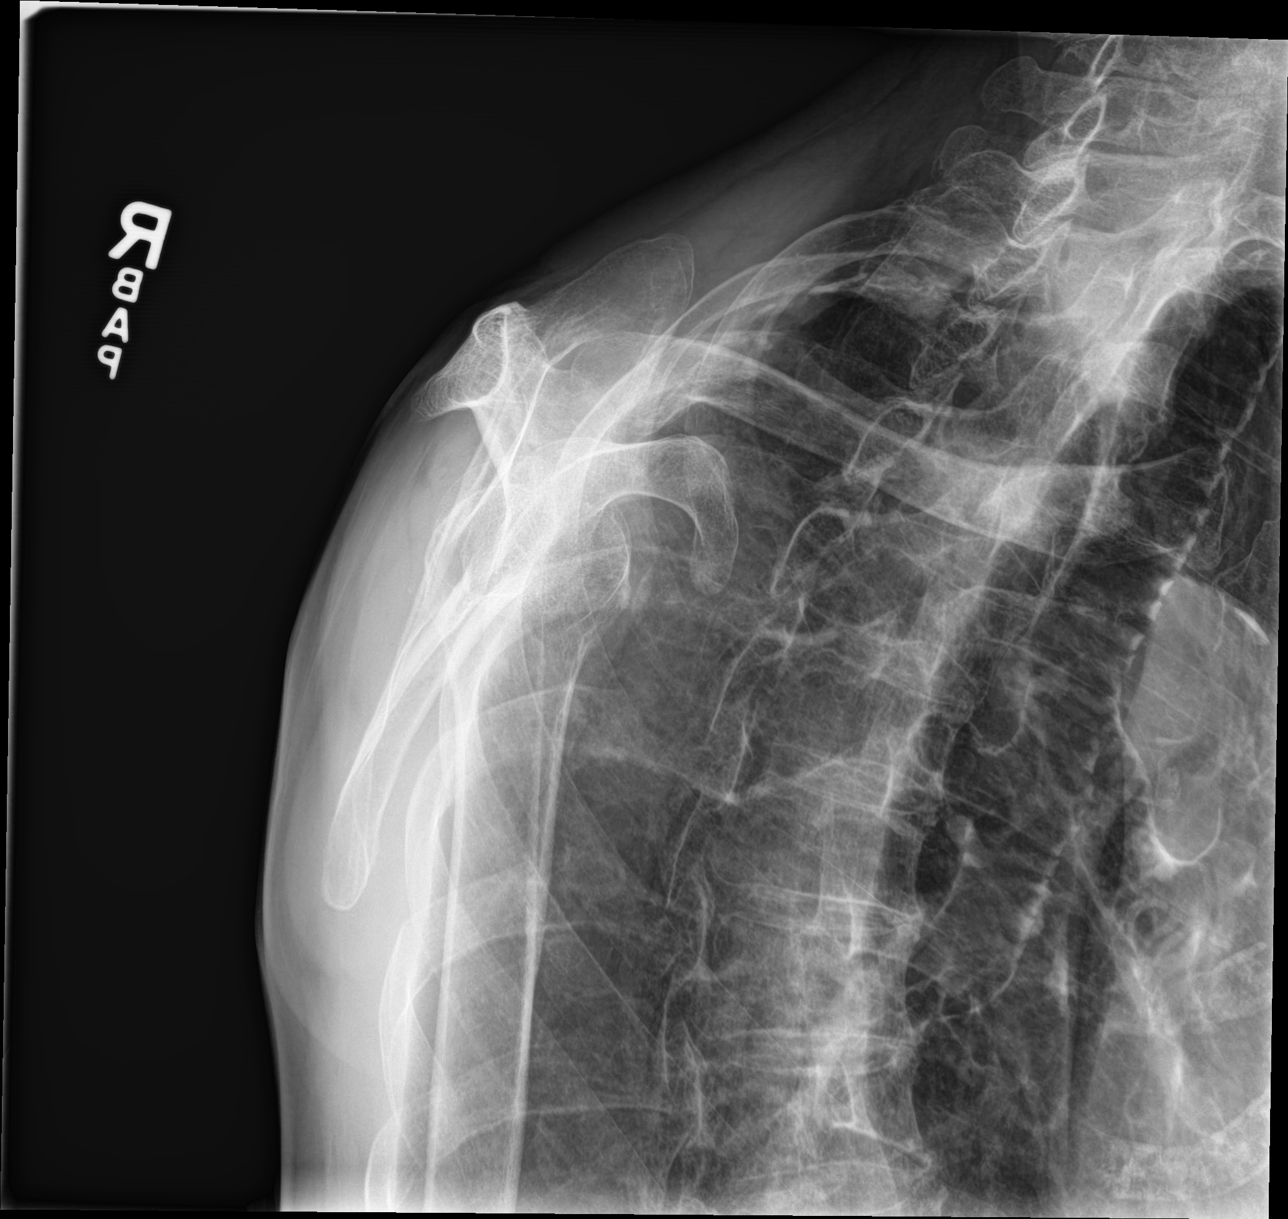

[shoulder axial]
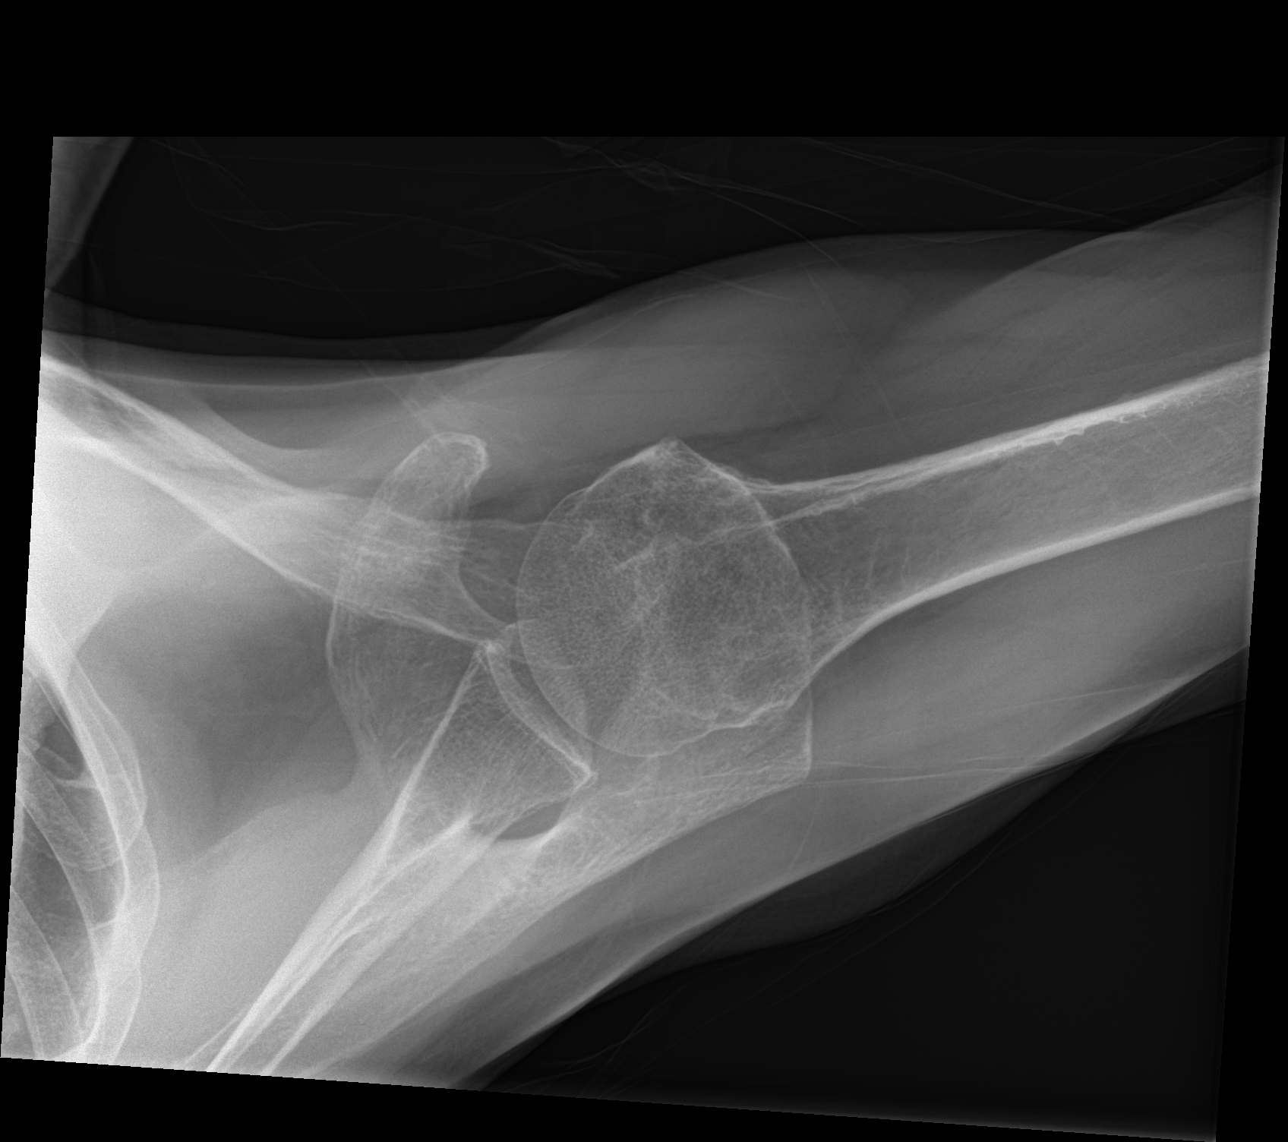

[3 of 3 positions shown; findings below may reference images not displayed]

FINDINGS: Inferior acromion spurring with subacromial narrowing that appears
more prominent than before, although changes could be projectional.
Located glenohumeral and acromioclavicular joints. No detected
glenohumeral joint space narrowing. Subjective generalized
osteopenia.
IMPRESSION: Subacromial spurring which may have progressed from 6931. No acute
finding.

## 2022-06-09 ENCOUNTER — Ambulatory Visit (INDEPENDENT_AMBULATORY_CARE_PROVIDER_SITE_OTHER): Payer: Medicare Other

## 2022-06-09 VITALS — BP 128/76 | HR 102 | Ht 66.0 in | Wt 123.4 lb

## 2022-06-09 DIAGNOSIS — Z0000 Encounter for general adult medical examination without abnormal findings: Secondary | ICD-10-CM | POA: Diagnosis not present

## 2022-06-09 NOTE — Progress Notes (Signed)
Subjective:   Kendra Lane is a 87 y.o. female who presents for Medicare Annual (Subsequent) preventive examination.  I connected with  Kendra Lane on 06/09/22 by a  in person visit  and verified that I am speaking with the correct person using two identifiers.  Patient Location: Other:  In Office  Provider Location: Office/Clinic   Review of Systems    Defer to PCP Cardiac Risk Factors include: advanced age (>24mn, >>22women);hypertension     Objective:    Today's Vitals   06/09/22 0946  BP: 128/76  Pulse: (!) 102  SpO2: 96%  Weight: 123 lb 6.4 oz (56 kg)  Height: '5\' 6"'$  (1.676 m)  PainSc: 4    Body mass index is 19.92 kg/m.     06/09/2022    9:49 AM 01/19/2022   11:04 AM 06/08/2021   10:07 AM 04/01/2021   12:27 PM 07/23/2020   11:21 AM 06/04/2020   10:18 AM 11/22/2019   10:58 AM  Advanced Directives  Does Patient Have a Medical Advance Directive? Yes No Yes No No Yes No  Type of AScientist, physiologicalof [place] will   [place]   Does patient want to make changes to medical advance directive? No - Patient declined        Copy of [place]?   Yes - validated most recent copy scanned in chart (See row information)   Yes - validated most recent copy scanned in chart (See row information)   Would patient like information on creating a medical advance directive?    No - Patient declined No - Patient declined      Current Medications (verified) Outpatient Encounter Medications as of 06/09/2022  Medication Sig   amLODipine (NORVASC) 10 MG tablet TAKE 1 TABLET BY MOUTH DAILY   aspirin 81 MG tablet Take 81 mg by mouth daily.   calcium-vitamin D (OSCAL WITH D) 500-200 MG-UNIT tablet Take 1 tablet by mouth. 600 mg daily   diclofenac Sodium (VOLTAREN) 1 % GEL Apply 2 g topically 4 (four) times daily.   metoprolol succinate (TOPROL-XL) 25 MG 24 hr tablet Take 3 tablets (75 mg total) by  mouth daily.   Multiple Vitamins-Minerals (CENTRUM SILVER 50+WOMEN PO) Take 1 tablet by mouth daily.   olmesartan (BENICAR) 40 MG tablet TAKE 1 TABLET(40 MG) BY MOUTH DAILY   omeprazole (PRILOSEC) 20 MG capsule TAKE 1 CAPSULE(20 MG) BY MOUTH DAILY   spironolactone (ALDACTONE) 25 MG tablet TAKE 1 TABLET(25 MG) BY MOUTH DAILY   No facility-administered encounter medications on file as of 06/09/2022.    Allergies (verified) Valsartan   History: Past Medical History:  Diagnosis Date   Dysrhythmia    hx of, ok now   GERD (gastroesophageal reflux disease)    Headache    twice a day sometimes   Hypertension    Neck pain    Shoulder pain, left    Past Surgical History:  Procedure Laterality Date   ABDOMINAL HYSTERECTOMY     CATARACT EXTRACTION W/PHACO Right 07/23/2020   Procedure: CATARACT EXTRACTION PHACO AND INTRAOCULAR LENS PLACEMENT (IOC) RIGHT 7.62 01:26.5 8.8%;  Surgeon: BLeandrew Koyanagi MD;  [facility]  Service: Ophthalmology;  Laterality: Right;   CATARACT EXTRACTION W/PHACO Left 04/01/2021   Procedure: CATARACT EXTRACTION PHACO AND INTRAOCULAR LENS PLACEMENT (IOC) LEFT 3.64 00:56.9;  Surgeon: BLeandrew Koyanagi MD;  [facility]  Service: Ophthalmology;  Laterality: Left;  ESOPHAGOGASTRODUODENOSCOPY (EGD) WITH ESOPHAGEAL DILATION  10/2014   Dr. Allen Norris   TOTAL VAGINAL HYSTERECTOMY  1993   TUBAL LIGATION     Family History  Problem Relation Age of Onset   Hypertension Mother    Other Father        "old age"   Breast cancer Daughter 65   Social History   Socioeconomic History   Marital status: Widowed    Spouse name: Not on file   Number of children: 8   Years of education: Not on file   Highest education level: 10th grade  Occupational History   Occupation: Retired  Tobacco Use   Smoking status: Never   Smokeless tobacco: Never   Tobacco comments:    smoking cessation materials not required  Vaping Use   Vaping Use:  Never used  Substance and Sexual Activity   Alcohol use: Not Currently    Alcohol/week: 2.0 standard drinks of alcohol    Types: 2 Cans of beer per week    Comment: last use 08/2019   Drug use: No   Sexual activity: Not Currently  Other Topics Concern   Not on file  Social History Narrative   Pt's son lives with her   Social Determinants of Health   Financial Resource Strain: Low Risk  (06/09/2022)   Overall Financial Resource Strain (CARDIA)    Difficulty of Paying Living Expenses: Not hard at all  Food Insecurity: No Food Insecurity (06/09/2022)   Hunger Vital Sign    Worried About Running Out of Food in the Last Year: Never true    Ran Out of Food in the Last Year: Never true  Transportation Needs: No Transportation Needs (06/09/2022)   PRAPARE - Hydrologist (Medical): No    Lack of Transportation (Non-Medical): No  Physical Activity: Insufficiently Active (06/09/2022)   Exercise Vital Sign    Days of Exercise per Week: 2 days    Minutes of Exercise per Session: 60 min  Stress: No Stress Concern Present (06/09/2022)   [place]    Feeling of Stress : Only a little  Social Connections: Socially Isolated (06/09/2022)   Social Connection and Isolation Panel [NHANES]    Frequency of Communication with Friends and Family: More than three times a week    Frequency of Social Gatherings with Friends and Family: More than three times a week    Attends Religious Services: Never    Marine scientist or Organizations: No    Attends Archivist Meetings: Never    Marital Status: Widowed    Tobacco Counseling Counseling given: Not Answered Tobacco comments: smoking cessation materials not required   Clinical Intake:  Pre-visit preparation completed: Yes  Pain : 0-10 Pain Score: 4  Pain Type: Acute pain Pain Location: Shoulder Pain Orientation: Left Pain Descriptors /  Indicators: Aching, Tender, Sore Pain Onset: 1 to 4 weeks ago Pain Frequency: Intermittent     Nutritional Risks: None Diabetes: No     Diabetic? No.     Information entered by :: Wyatt Haste, [place] of Daily Living    06/09/2022    9:50 AM 01/04/2022    1:31 PM  In your present state of health, do you have any difficulty performing the following activities:  Hearing? 0 0  Vision? 0 0  Difficulty concentrating or making decisions? 0 0  Walking or climbing stairs? 0 0  Dressing or  bathing? 0 0  Doing errands, shopping? 0 0  Preparing Food and eating ? N   Using the Toilet? N   In the past six months, have you accidently leaked urine? N   Do you have problems with loss of bowel control? N   Managing your Medications? N   Managing your Finances? N   Housekeeping or managing your Housekeeping? N     Patient Care Team: Glean Hess, MD as PCP - General (Internal Medicine) Corey Skains, MD as Consulting Physician (Cardiology)  Indicate any recent Medical Services you may have received from other than [facility] providers in the past year (date may be approximate).     Assessment:   This is a routine wellness examination for [place].  Hearing/Vision screen Hearing Screening - Comments:: No concerns for hearing. Vision Screening - Comments:: No vision concerns.  Dietary issues and exercise activities discussed:  Continue to remain active, and drink plenty of fluids.   Goals Addressed             This Visit's Progress    Prevent Falls   On track    Remain active and continue to prevent falls.       Depression Screen    06/09/2022    9:50 AM 05/10/2022   10:54 AM 01/04/2022    1:30 PM 11/27/2021   10:27 AM 06/16/2021    9:25 AM 06/08/2021   10:07 AM 04/03/2021    3:11 PM  PHQ 2/9 Scores  PHQ - 2 Score 2 0 '4 4 2 '$ 0 0  PHQ- 9 Score 8 0 '12 8 4  4    '$ Fall Risk    06/09/2022    9:50 AM 05/10/2022   10:54 AM 01/04/2022    1:31 PM 06/16/2021     9:26 AM 06/08/2021   10:09 AM  Fall Risk   Falls in the past year? 0 0 0 0 0  Number falls in past yr: 0 0 0 0 0  Injury with Fall? 0 0 0 0 0  Risk for fall due to : No Fall Risks No Fall Risks No Fall Risks No Fall Risks No Fall Risks  Follow up Falls evaluation completed Falls evaluation completed Falls evaluation completed Falls evaluation completed Falls prevention discussed    FALL RISK PREVENTION PERTAINING TO THE HOME:  Any stairs in or around the home? No  If so, are there any without handrails?  N/A Home free of loose throw rugs in walkways, pet beds, electrical cords, etc? Yes  Adequate lighting in your home to reduce risk of falls? Yes   ASSISTIVE DEVICES UTILIZED TO PREVENT FALLS:  Life alert? No  Use of a cane, walker or w/c? No  Grab bars in the bathroom? Yes  Shower chair or bench in shower? No  Elevated toilet seat or a handicapped toilet? Yes   TIMED UP AND GO:  Was the test performed? Yes .   Gait steady and fast without use of assistive device  Cognitive Function:    04/07/2016    9:17 AM  MMSE - Mini Mental State Exam  Orientation to time 5  Orientation to time comments 6CIT  Orientation to Place 5  Orientation to Place-comments 6CIT  Registration 3  Registration-comments 6CIT  Attention/ Calculation 5  Attention/Calculation-comments 6CIT  Recall 1  Recall-comments 6CIT  Language- name 2 objects 2  Language- name 2 objects-comments 6CIT        06/09/2022    9:51 AM  06/04/2020   10:20 AM 06/04/2019   10:14 AM 05/29/2018   10:17 AM 05/25/2017   10:48 AM  6CIT Screen  What Year? 0 points 0 points 0 points 0 points 0 points  What month? 0 points 0 points 0 points 0 points 0 points  What time? 0 points 0 points 0 points 0 points 0 points  Count back from 20 0 points 0 points 0 points 0 points 0 points  Months in reverse 0 points 0 points 0 points 0 points 0 points  Repeat phrase 4 points 2 points 2 points 6 points 2 points  Total Score 4  points 2 points 2 points 6 points 2 points    Immunizations Immunization History  Administered Date(s) Administered   Moderna Sars-Covid-2 Vaccination 02/25/2020, 03/24/2020   Pneumococcal Conjugate-13 04/07/2016   Pneumococcal Polysaccharide-23 06/26/2012    TDAP status: Due, Education has been provided regarding the importance of this vaccine. Advised may receive this vaccine at local pharmacy or Health Dept. Aware to provide a copy of the vaccination record if obtained from local pharmacy or Health Dept. Verbalized acceptance and understanding.  Flu Vaccine status: Declined, Education has been provided regarding the importance of this vaccine but patient still declined. Advised may receive this vaccine at local pharmacy or Health Dept. Aware to provide a copy of the vaccination record if obtained from local pharmacy or Health Dept. Verbalized acceptance and understanding.  Pneumococcal vaccine status: Up to date  Covid-19 vaccine status: Completed vaccines  Qualifies for Shingles Vaccine? Yes   Zostavax completed No   Shingrix Completed?: No.    Education has been provided regarding the importance of this vaccine. Patient has been advised to call insurance company to determine out of pocket expense if they have not yet received this vaccine. Advised may also receive vaccine at local pharmacy or Health Dept. Verbalized acceptance and understanding.  Screening Tests Health Maintenance  Topic Date Due   TETANUS/TDAP  Never done   Zoster Vaccines- Shingrix (1 of 2) Never done   COVID-19 Vaccine (3 - Moderna series) 06/25/2022 (Originally 05/19/2020)   INFLUENZA VACCINE  12/12/2022 (Originally 04/13/2022)   DEXA SCAN  06/10/2023 (Originally 06/15/2020)   MAMMOGRAM  01/05/2023   Pneumonia Vaccine 31+ Years old  Completed   HPV VACCINES  Aged Out    Health Maintenance  Health Maintenance Due  Topic Date Due   TETANUS/TDAP  Never done   Zoster Vaccines- Shingrix (1 of 2) Never done     Colorectal cancer screening: No longer required.   Mammogram status: No longer required due to age.  Bone Density status: Completed 06/15/2018. Results reflect: Bone density results: OSTEOPOROSIS. Repeat every 2 years.  Lung Cancer Screening: (Low Dose CT Chest recommended if Age 16-80 years, 30 pack-year currently smoking OR have quit w/in 15years.) does not qualify.   Lung Cancer Screening Referral: N/A  Additional Screening:  Hepatitis C Screening: does not qualify;  Vision Screening: Recommended annual ophthalmology exams for early detection of glaucoma and other disorders of the eye. Is the patient up to date with their annual eye exam?  Yes  Who is the provider or what is the name of the office in which the patient attends annual eye exams? [facility] If pt is not established with a provider, would they like to be referred to a provider to establish care? No .   Dental Screening: Recommended annual dental exams for proper oral hygiene  Community Resource Referral / Chronic Care  Management: CRR required this visit?  No   CCM required this visit?  No      Plan:     I have personally reviewed and noted the following in the patient's chart:   Medical and social history Use of alcohol, tobacco or illicit drugs  Current medications and supplements including opioid prescriptions. Patient is not currently taking opioid prescriptions. Functional ability and status Nutritional status Physical activity Advanced directives List of other physicians Hospitalizations, surgeries, and ER visits in previous 12 months Vitals Screenings to include cognitive, depression, and falls Referrals and appointments  In addition, I have reviewed and discussed with patient certain preventive protocols, quality metrics, and best practice recommendations. A written personalized care plan for preventive services as well as general preventive health recommendations were provided to  patient.     Clista Bernhardt, [place]   06/09/2022    Ms. Chrissie Noa , Thank you for taking time to come for your Medicare Wellness Visit. I appreciate your ongoing commitment to your health goals. Please review the following plan we discussed and let me know if I can assist you in the future.   These are the goals we discussed:  Goals      DIET - INCREASE WATER INTAKE     Recommend to drink at least 6-8 8oz glasses of water per day.     Prevent Falls     Remain active and continue to prevent falls.         This is a list of the screening recommended for you and due dates:  Health Maintenance  Topic Date Due   Tetanus Vaccine  Never done   Zoster (Shingles) Vaccine (1 of 2) Never done   COVID-19 Vaccine (3 - Moderna series) 06/25/2022*   Flu Shot  12/12/2022*   DEXA scan (bone density measurement)  06/10/2023*   Mammogram  01/05/2023   Pneumonia Vaccine  Completed   HPV Vaccine  Aged Out  *Topic was postponed. The date shown is not the original due date.      Nurse Notes: Patient declined DEXA order today.

## 2022-06-14 ENCOUNTER — Ambulatory Visit
Admission: EM | Admit: 2022-06-14 | Discharge: 2022-06-14 | Disposition: A | Discharge status: Home / Self Care | Payer: Medicare Other | Attending: Emergency Medicine | Admitting: Emergency Medicine

## 2022-06-14 DIAGNOSIS — M25511 Pain in right shoulder: Secondary | ICD-10-CM | POA: Diagnosis not present

## 2022-06-14 DIAGNOSIS — G8929 Other chronic pain: Secondary | ICD-10-CM | POA: Diagnosis not present

## 2022-06-14 DIAGNOSIS — M25512 Pain in left shoulder: Secondary | ICD-10-CM

## 2022-06-14 MED ORDER — PREDNISONE 10 MG (21) PO TBPK: ORAL_TABLET | Freq: Every day | ORAL | 0 refills | Status: DC

## 2022-06-14 NOTE — Discharge Instructions (Addendum)
Follow-up with orthopedic for CT scans or further treatment Use warm compresses or heating pads as needed for discomfort Take Tylenol extra strength as needed for pain

## 2022-06-14 NOTE — ED Triage Notes (Signed)
Pt c/o bilateral shoulder pains since last year, pt states pains not better. Pt reports pain mostly;y comes at night, denies any fall or injury

## 2022-06-14 NOTE — ED Provider Notes (Signed)
[facility]    CSN: [id number] Arrival date & time: 06/14/22  1022      History   Chief Complaint Chief Complaint  Patient presents with   Shoulder Pain    Bilateral    HPI Kendra Lane is a 87 y.o. female.   Patient presents today with chronic bilateral shoulder arm pain for over a year now.  Patient states that she was seen here on approximately 5 months ago and the pain has not changed.  Patient states that it is intermittent and sometimes it bothers her during the day most the time it bothers her in the evening time.  Takes some Tylenol every once in a while with some relief.  Denies any chest pain no shortness of breath no new injury.  Patient believes that he had his occurred over time as her job she used to do a lot of pulling.  Patient has never been seen by orthopedic.    Past Medical History:  Diagnosis Date   Dysrhythmia    hx of, ok now   GERD (gastroesophageal reflux disease)    Headache    twice a day sometimes   Hypertension    Neck pain    Shoulder pain, left     Patient Active Problem List   Diagnosis Date Noted   Age-related osteoporosis without current pathological fracture 06/12/2020   Aortic atherosclerosis ([place]) 04/02/2020   Arthritis of shoulder 11/30/2017   Bigeminy 04/07/2016   Benign esophageal stricture 01/21/2015   Essential (primary) hypertension 01/21/2015   Hammer toe 01/21/2015   Lipoma of skin and subcutaneous tissue of trunk 01/21/2015    Past Surgical History:  Procedure Laterality Date   ABDOMINAL HYSTERECTOMY     CATARACT EXTRACTION W/PHACO Right 07/23/2020   Procedure: CATARACT EXTRACTION PHACO AND INTRAOCULAR LENS PLACEMENT (IOC) RIGHT 7.62 01:26.5 8.8%;  Surgeon: Leandrew Koyanagi, MD;  Location: [place];  Service: Ophthalmology;  Laterality: Right;   CATARACT EXTRACTION W/PHACO Left 04/01/2021   Procedure: CATARACT EXTRACTION PHACO AND INTRAOCULAR LENS PLACEMENT (IOC) LEFT 3.64 00:56.9;   Surgeon: Leandrew Koyanagi, MD;  Location: [facility];  Service: Ophthalmology;  Laterality: Left;   ESOPHAGOGASTRODUODENOSCOPY (EGD) WITH ESOPHAGEAL DILATION  10/2014   Dr. Allen Norris   TOTAL VAGINAL HYSTERECTOMY  1993   TUBAL LIGATION      OB History   No obstetric history on file.      Home Medications    Prior to Admission medications   Medication Sig Start Date End Date Taking? Authorizing Provider  amLODipine (NORVASC) 10 MG tablet TAKE 1 TABLET BY MOUTH DAILY 01/08/22  Yes Glean Hess, MD  aspirin 81 MG tablet Take 81 mg by mouth daily.   Yes [provider]  calcium-vitamin D (OSCAL WITH D) 500-200 MG-UNIT tablet Take 1 tablet by mouth. 600 mg daily   Yes [provider]  diclofenac Sodium (VOLTAREN) 1 % GEL Apply 2 g topically 4 (four) times daily. 01/19/22  Yes Wynona Luna, MD  metoprolol succinate (TOPROL-XL) 25 MG 24 hr tablet Take 3 tablets (75 mg total) by mouth daily. 01/04/22  Yes Glean Hess, MD  Multiple Vitamins-Minerals (CENTRUM SILVER 50+WOMEN PO) Take 1 tablet by mouth daily.   Yes [provider]  olmesartan (BENICAR) 40 MG tablet TAKE 1 TABLET(40 MG) BY MOUTH DAILY 05/26/22  Yes Glean Hess, MD  omeprazole (PRILOSEC) 20 MG capsule TAKE 1 CAPSULE(20 MG) BY MOUTH DAILY 06/24/21  Yes Glean Hess, MD  predniSONE (STERAPRED UNI-PAK 21 TAB) 10 MG (21) TBPK tablet Take by mouth daily. Take 6 tabs by mouth daily  for 2 days, then 5 tabs for 2 days, then 4 tabs for 2 days, then 3 tabs for 2 days, 2 tabs for 2 days, then 1 tab by mouth daily for 2 days 06/14/22  Yes Marney Setting, NP  spironolactone (ALDACTONE) 25 MG tablet TAKE 1 TABLET(25 MG) BY MOUTH DAILY 05/10/22  Yes Glean Hess, MD    Family History Family History  Problem Relation Age of Onset   Hypertension Mother    Other Father        "old age"   Breast cancer Daughter 48    Social History Social History   Tobacco Use   Smoking  status: Never   Smokeless tobacco: Never   Tobacco comments:    smoking cessation materials not required  Vaping Use   Vaping Use: Never used  Substance Use Topics   Alcohol use: Not Currently    Alcohol/week: 2.0 standard drinks of alcohol    Types: 2 Cans of beer per week    Comment: last use 08/2019   Drug use: No     Allergies   Valsartan   Review of Systems Review of Systems  Constitutional: Negative.   Respiratory: Negative.    Cardiovascular: Negative.   Gastrointestinal: Negative.   Genitourinary: Negative.   Musculoskeletal:  Positive for arthralgias.       Bilateral shoulder pain more so with lifting.  Skin: Negative.   Neurological: Negative.      Physical Exam Triage Vital Signs ED Triage Vitals  Enc Vitals Group     BP 06/14/22 1109 136/78     Pulse Rate 06/14/22 1109 79     Resp --      Temp 06/14/22 1109 98.7 F (37.1 C)     Temp Source 06/14/22 1109 Oral     SpO2 06/14/22 1109 96 %     Weight 06/14/22 1108 120 lb (54.4 kg)     Height 06/14/22 1108 '5\' 6"'$  (1.676 m)     Head Circumference --      Peak Flow --      Pain Score 06/14/22 1107 0     Pain Loc --      Pain Edu? --      Excl. in [place]? --    No data found.  Updated Vital Signs BP 136/78 (BP Location: Left Arm)   Pulse 79   Temp 98.7 F (37.1 C) (Oral)   Ht '5\' 6"'$  (1.676 m)   Wt 120 lb (54.4 kg)   SpO2 96%   BMI 19.37 kg/m   Visual Acuity Right Eye Distance:   Left Eye Distance:   Bilateral Distance:    Right Eye Near:   Left Eye Near:    Bilateral Near:     Physical Exam Constitutional:      Appearance: Normal appearance. She is normal weight.  Cardiovascular:     Rate and Rhythm: Normal rate.  Pulmonary:     Effort: Pulmonary effort is normal.  Abdominal:     General: Abdomen is flat.  Musculoskeletal:        General: Tenderness present. No swelling, deformity or signs of injury. Normal range of motion.     Cervical back: Normal range of motion. No tenderness.      Right lower leg: No edema.     Left lower leg: No edema.  Skin:  General: Skin is warm.     Capillary Refill: Capillary refill takes 2 to 3 seconds.  Neurological:     General: No focal deficit present.     Mental Status: She is alert.      UC Treatments / Results  Labs (all labs ordered are listed, but only abnormal results are displayed) Labs Reviewed - No data to display  EKG   Radiology No results found.  Procedures Procedures (including critical care time)  Medications Ordered in UC Medications - No data to display  Initial Impression / Assessment and Plan / UC Course  I have reviewed the triage vital signs and the nursing notes.  Pertinent labs & imaging results that were available during my care of the patient were reviewed by me and considered in my medical decision making (see chart for details).      Follow-up with orthopedic for CT scans or further treatment Use warm compresses or heating pads as needed for discomfort Take Tylenol extra strength as needed for pain Suppress the pacing patient we are not able to rule out arthritis or other causes of the discomfort  Final diagnoses:  Chronic pain of both shoulders     Discharge Instructions      Follow-up with orthopedic for CT scans or further treatment Use warm compresses or heating pads as needed for discomfort Take Tylenol extra strength as needed for pain      ED Prescriptions     Medication Sig Dispense Auth. Provider   predniSONE (STERAPRED UNI-PAK 21 TAB) 10 MG (21) TBPK tablet Take by mouth daily. Take 6 tabs by mouth daily  for 2 days, then 5 tabs for 2 days, then 4 tabs for 2 days, then 3 tabs for 2 days, 2 tabs for 2 days, then 1 tab by mouth daily for 2 days 42 tablet Marney Setting, NP      PDMP not reviewed this encounter.   Marney Setting, NP 06/14/22 [phone]

## 2022-06-21 DIAGNOSIS — M25512 Pain in left shoulder: Secondary | ICD-10-CM | POA: Diagnosis not present

## 2022-06-21 DIAGNOSIS — M25511 Pain in right shoulder: Secondary | ICD-10-CM | POA: Diagnosis not present

## 2022-06-22 ENCOUNTER — Ambulatory Visit: Payer: Medicare Other | Admitting: Internal Medicine

## 2022-06-28 ENCOUNTER — Ambulatory Visit: Payer: Medicare Other | Admitting: Internal Medicine

## 2022-06-28 NOTE — Progress Notes (Deleted)
Date:  06/28/2022   Name:  Kendra Lane   DOB:  [birth year]   MRN:  [id number]   Chief Complaint: No chief complaint on file. ER follow up for shoulder pain on 06/14/22.  She was treated with a 12 days steroid taper.  Xray right shoulder: IMPRESSION: Subacromial spurring which may have progressed from 2018. No acute finding.  HPI  Lab Results  Component Value Date   NA 138 05/10/2022   K 3.6 05/10/2022   CO2 27 05/10/2022   GLUCOSE 108 (H) 05/10/2022   BUN 13 05/10/2022   CREATININE 0.66 05/10/2022   CALCIUM 9.2 05/10/2022   EGFR 73 01/04/2022   GFRNONAA >60 05/10/2022   Lab Results  Component Value Date   CHOL 218 (H) 06/16/2021   HDL 87 06/16/2021   LDLCALC 119 (H) 06/16/2021   TRIG 68 06/16/2021   CHOLHDL 2.5 06/16/2021   Lab Results  Component Value Date   TSH 0.837 06/16/2021   Lab Results  Component Value Date   HGBA1C 5.7 (H) 06/12/2019   Lab Results  Component Value Date   WBC 5.4 06/16/2021   HGB 13.0 06/16/2021   HCT 38.7 06/16/2021   MCV 90 06/16/2021   PLT 362 06/16/2021   Lab Results  Component Value Date   ALT 12 06/16/2021   AST 17 06/16/2021   ALKPHOS 43 (L) 06/16/2021   BILITOT 0.3 06/16/2021   Lab Results  Component Value Date   VD25OH 36.4 06/16/2021     Review of Systems  Patient Active Problem List   Diagnosis Date Noted   Age-related osteoporosis without current pathological fracture 06/12/2020   Aortic atherosclerosis ([place]) 04/02/2020   Arthritis of shoulder 11/30/2017   Bigeminy 04/07/2016   Benign esophageal stricture 01/21/2015   Essential (primary) hypertension 01/21/2015   Hammer toe 01/21/2015   Lipoma of skin and subcutaneous tissue of trunk 01/21/2015    Allergies  Allergen Reactions   Valsartan Other (See Comments)    dizziness    Past Surgical History:  Procedure Laterality Date   ABDOMINAL HYSTERECTOMY     CATARACT EXTRACTION W/PHACO Right 07/23/2020   Procedure: CATARACT EXTRACTION PHACO  AND INTRAOCULAR LENS PLACEMENT (IOC) RIGHT 7.62 01:26.5 8.8%;  Surgeon: Leandrew Koyanagi, MD;  Location: [facility];  Service: Ophthalmology;  Laterality: Right;   CATARACT EXTRACTION W/PHACO Left 04/01/2021   Procedure: CATARACT EXTRACTION PHACO AND INTRAOCULAR LENS PLACEMENT (IOC) LEFT 3.64 00:56.9;  Surgeon: Leandrew Koyanagi, MD;  Location: [facility];  Service: Ophthalmology;  Laterality: Left;   ESOPHAGOGASTRODUODENOSCOPY (EGD) WITH ESOPHAGEAL DILATION  10/2014   Dr. Allen Norris   TOTAL VAGINAL HYSTERECTOMY  1993   TUBAL LIGATION      Social History   Tobacco Use   Smoking status: Never   Smokeless tobacco: Never   Tobacco comments:    smoking cessation materials not required  Vaping Use   Vaping Use: Never used  Substance Use Topics   Alcohol use: Not Currently    Alcohol/week: 2.0 standard drinks of alcohol    Types: 2 Cans of beer per week    Comment: last use 08/2019   Drug use: No     Medication list has been reviewed and updated.  No outpatient medications have been marked as taking for the 06/28/22 encounter (Appointment) with Glean Hess, MD.       05/10/2022   10:54 AM 01/04/2022    1:31 PM 11/27/2021   10:28 AM 06/16/2021    9:26  AM  GAD 7 : Generalized Anxiety Score  Nervous, Anxious, on Edge _0 Control/stop worrying 0 _1 Worry too much - different things _2 Trouble relaxing 0 2 0 0  Restless 0 2 0 0  Easily annoyed or irritable 1 2 0 0  Afraid - awful might happen 0 2 0 0  Total GAD 7 Score _3 Anxiety Difficulty Somewhat difficult Somewhat difficult Not difficult at all        06/09/2022    9:50 AM 05/10/2022   10:54 AM 01/04/2022    1:30 PM  Depression screen PHQ 2/9  Decreased Interest 2 0 2  Down, Depressed, Hopeless  0 2  PHQ - 2 Score 2 0 4  Altered sleeping 3 0 2  Tired, decreased energy 3 0 3  Change in appetite 0 0 0  Feeling bad or failure about yourself  0 0 1  Trouble concentrating 0 0 0   Moving slowly or fidgety/restless 0 0 2  Suicidal thoughts 0 0 0  PHQ-9 Score 8 0 12  Difficult doing work/chores Somewhat difficult Not difficult at all Somewhat difficult    BP Readings from Last 3 Encounters:  06/14/22 136/78  06/09/22 128/76  05/10/22 132/60    Physical Exam  Wt Readings from Last 3 Encounters:  06/14/22 120 lb (54.4 kg)  06/09/22 123 lb 6.4 oz (56 kg)  05/10/22 125 lb (56.7 kg)    There were no vitals taken for this visit.  Assessment and Plan:

## 2022-07-02 ENCOUNTER — Ambulatory Visit (INDEPENDENT_AMBULATORY_CARE_PROVIDER_SITE_OTHER): Payer: Medicare Other | Admitting: Internal Medicine

## 2022-07-02 ENCOUNTER — Encounter: Payer: Self-pay | Admitting: Internal Medicine

## 2022-07-02 VITALS — BP 128/62 | HR 89 | Ht 66.0 in | Wt 125.0 lb

## 2022-07-02 DIAGNOSIS — M19019 Primary osteoarthritis, unspecified shoulder: Secondary | ICD-10-CM | POA: Diagnosis not present

## 2022-07-02 DIAGNOSIS — I10 Essential (primary) hypertension: Secondary | ICD-10-CM

## 2022-07-02 DIAGNOSIS — H6123 Impacted cerumen, bilateral: Secondary | ICD-10-CM

## 2022-07-02 NOTE — Progress Notes (Signed)
  Date:  07/02/2022   Name:  Kendra Lane   DOB:  10/25/1934   MRN:  [id number]   Chief Complaint: Shoulder Pain  Shoulder Pain  The pain is present in the left shoulder and right shoulder. This is a recurrent problem. There has been no history of extremity trauma. The problem occurs intermittently. The problem has been gradually improving. Quality: discomfort. The pain is at a severity of 2/10. The pain is mild. Treatments tried: prednisone. The treatment provided moderate relief.  Ear Fullness  There is pain in both ears. This is a new problem. The problem has been unchanged. There has been no fever. Pertinent negatives include no abdominal pain, coughing, diarrhea, ear discharge, headaches or hearing loss. Associated symptoms comments: Hears wind blowing or insect sounds intermittently .  Hypertension This is a chronic problem. The problem is controlled. Pertinent negatives include no chest pain, headaches, palpitations or shortness of breath.   DG RIGHT SHOULDER  01/2022 Inferior acromion spurring with subacromial narrowing that appears more prominent than before, although changes could be projectional. Located glenohumeral and acromioclavicular joints. No detected glenohumeral joint space narrowing. Subjective generalized osteopenia.   IMPRESSION: Subacromial spurring which may have progressed from 2018. No acute finding.  Lab Results  Component Value Date   NA 138 05/10/2022   K 3.6 05/10/2022   CO2 27 05/10/2022   GLUCOSE 108 (H) 05/10/2022   BUN 13 05/10/2022   CREATININE 0.66 05/10/2022   CALCIUM 9.2 05/10/2022   EGFR 73 01/04/2022   GFRNONAA >60 05/10/2022   Lab Results  Component Value Date   CHOL 218 (H) 06/16/2021   HDL 87 06/16/2021   LDLCALC 119 (H) 06/16/2021   TRIG 68 06/16/2021   CHOLHDL 2.5 06/16/2021   Lab Results  Component Value Date   TSH 0.837 06/16/2021   Lab Results  Component Value Date   HGBA1C 5.7 (H) 06/12/2019   Lab Results   Component Value Date   WBC 5.4 06/16/2021   HGB 13.0 06/16/2021   HCT 38.7 06/16/2021   MCV 90 06/16/2021   PLT 362 06/16/2021   Lab Results  Component Value Date   ALT 12 06/16/2021   AST 17 06/16/2021   ALKPHOS 43 (L) 06/16/2021   BILITOT 0.3 06/16/2021   Lab Results  Component Value Date   VD25OH 36.4 06/16/2021     Review of Systems  Constitutional:  Negative for fatigue and unexpected weight change.  HENT:  Negative for ear discharge, hearing loss and nosebleeds.   Eyes:  Negative for visual disturbance.  Respiratory:  Negative for cough, chest tightness, shortness of breath and wheezing.   Cardiovascular:  Negative for chest pain, palpitations and leg swelling.  Gastrointestinal:  Negative for abdominal pain, constipation and diarrhea.  Musculoskeletal:  Positive for arthralgias (right shoulder much improved after steroid taper and with heat and exercise).  Neurological:  Negative for dizziness, weakness, light-headedness and headaches.    Patient Active Problem List   Diagnosis Date Noted   Age-related osteoporosis without current pathological fracture 06/12/2020   Aortic atherosclerosis ([facility]) 04/02/2020   Arthritis of shoulder 11/30/2017   Bigeminy 04/07/2016   Benign esophageal stricture 01/21/2015   Essential (primary) hypertension 01/21/2015   Hammer toe 01/21/2015   Lipoma of skin and subcutaneous tissue of trunk 01/21/2015    Allergies  Allergen Reactions   Valsartan Other (See Comments)    dizziness    Past Surgical History:  Procedure Laterality Date   ABDOMINAL HYSTERECTOMY       CATARACT EXTRACTION W/PHACO Right 07/23/2020   Procedure: CATARACT EXTRACTION PHACO AND INTRAOCULAR LENS PLACEMENT (IOC) RIGHT 7.62 01:26.5 8.8%;  Surgeon: Leandrew Koyanagi, MD;  Location: [facility];  Service: Ophthalmology;  Laterality: Right;   CATARACT EXTRACTION W/PHACO Left 04/01/2021   Procedure: CATARACT EXTRACTION PHACO AND INTRAOCULAR LENS PLACEMENT  (IOC) LEFT 3.64 00:56.9;  Surgeon: Leandrew Koyanagi, MD;  Location: [facility];  Service: Ophthalmology;  Laterality: Left;   ESOPHAGOGASTRODUODENOSCOPY (EGD) WITH ESOPHAGEAL DILATION  10/2014   Dr. Allen Norris   TOTAL VAGINAL HYSTERECTOMY  1993   TUBAL LIGATION      Social History   Tobacco Use   Smoking status: Never   Smokeless tobacco: Never   Tobacco comments:    smoking cessation materials not required  Vaping Use   Vaping Use: Never used  Substance Use Topics   Alcohol use: Not Currently    Alcohol/week: 2.0 standard drinks of alcohol    Types: 2 Cans of beer per week    Comment: last use 08/2019   Drug use: No     Medication list has been reviewed and updated.  Current Meds  Medication Sig   amLODipine (NORVASC) 10 MG tablet TAKE 1 TABLET BY MOUTH DAILY   aspirin 81 MG tablet Take 81 mg by mouth daily.   calcium-vitamin D (OSCAL WITH D) 500-200 MG-UNIT tablet Take 1 tablet by mouth. 600 mg daily   diclofenac Sodium (VOLTAREN) 1 % GEL Apply 2 g topically 4 (four) times daily.   metoprolol succinate (TOPROL-XL) 25 MG 24 hr tablet Take 3 tablets (75 mg total) by mouth daily.   Multiple Vitamins-Minerals (CENTRUM SILVER 50+WOMEN PO) Take 1 tablet by mouth daily.   olmesartan (BENICAR) 40 MG tablet TAKE 1 TABLET(40 MG) BY MOUTH DAILY   omeprazole (PRILOSEC) 20 MG capsule TAKE 1 CAPSULE(20 MG) BY MOUTH DAILY   spironolactone (ALDACTONE) 25 MG tablet TAKE 1 TABLET(25 MG) BY MOUTH DAILY   [DISCONTINUED] predniSONE (STERAPRED UNI-PAK 21 TAB) 10 MG (21) TBPK tablet Take by mouth daily. Take 6 tabs by mouth daily  for 2 days, then 5 tabs for 2 days, then 4 tabs for 2 days, then 3 tabs for 2 days, 2 tabs for 2 days, then 1 tab by mouth daily for 2 days       07/02/2022   10:00 AM 05/10/2022   10:54 AM 01/04/2022    1:31 PM 11/27/2021   10:28 AM  GAD 7 : Generalized Anxiety Score  Nervous, Anxious, on Edge _0 Control/stop worrying 2 0 3 1  Worry too much -  different things _1 Trouble relaxing 1 0 2 0  Restless 0 0 2 0  Easily annoyed or irritable _2 0  Afraid - awful might happen 1 0 2 0  Total GAD 7 Score _3 Anxiety Difficulty Not difficult at all Somewhat difficult Somewhat difficult Not difficult at all       07/02/2022    9:59 AM 06/09/2022    9:50 AM 05/10/2022   10:54 AM  Depression screen PHQ 2/9  Decreased Interest 0 2 0  Down, Depressed, Hopeless 0  0  PHQ - 2 Score 0 2 0  Altered sleeping 1 3 0  Tired, decreased energy 0 3 0  Change in appetite 0 0 0  Feeling bad or failure about yourself  0 0 0  Trouble concentrating 1 0 0  Moving slowly or fidgety/restless  1 0 0  Suicidal thoughts 0 0 0  PHQ-9 Score 3 8 0  Difficult doing work/chores Not difficult at all Somewhat difficult Not difficult at all    BP Readings from Last 3 Encounters:  07/02/22 128/62  06/14/22 136/78  06/09/22 128/76    Physical Exam Vitals and nursing note reviewed.  Constitutional:      General: She is not in acute distress.    Appearance: Normal appearance. She is well-developed.  HENT:     Head: Normocephalic and atraumatic.     Right Ear: There is impacted cerumen.     Left Ear: There is impacted cerumen.     Ears:     Comments: Moderate amount of cerumen removed from the right ear canal - pt tolerated well.  TM normal. Left side appears more impacted and less symptomatic so will not address today Cardiovascular:     Rate and Rhythm: Normal rate and regular rhythm.  Pulmonary:     Effort: Pulmonary effort is normal. No respiratory distress.     Breath sounds: No wheezing or rhonchi.  Musculoskeletal:     Right shoulder: No swelling, deformity, effusion, tenderness, bony tenderness or crepitus. Normal range of motion.     Left shoulder: Normal.     Right lower leg: No edema.     Left lower leg: No edema.  Skin:    General: Skin is warm and dry.     Findings: No rash.  Neurological:     Mental Status: She is alert  and oriented to person, place, and time.  Psychiatric:        Mood and Affect: Mood normal.        Behavior: Behavior normal.     Wt Readings from Last 3 Encounters:  07/02/22 125 lb (56.7 kg)  06/14/22 120 lb (54.4 kg)  06/09/22 123 lb 6.4 oz (56 kg)    BP 128/62   Pulse 89   Ht 5' 6" (1.676 m)   Wt 125 lb (56.7 kg)   SpO2 95%   BMI 20.18 kg/m   Assessment and Plan: 1. Essential (primary) hypertension Clinically stable exam with well controlled BP. Tolerating medications without side effects at this time. Pt to continue current regimen and low sodium diet; benefits of regular exercise as able discussed.  2. Arthritis of shoulder Likely tendonitis - responded well to medrol dosepak Continue heat and exercise Follow up if needed  3. Bilateral impacted cerumen Removed from right ear with curette. Recommend H2O2 one drop in each ear weekly to soften cerumen If symptoms worsen, refer to ENT   Partially dictated using Dragon software. Any errors are unintentional.  Laura Berglund, MD [facility]  07/02/2022     

## 2022-07-07 ENCOUNTER — Other Ambulatory Visit: Payer: Self-pay | Admitting: Internal Medicine

## 2022-07-07 DIAGNOSIS — K222 Esophageal obstruction: Secondary | ICD-10-CM

## 2022-07-07 DIAGNOSIS — I10 Essential (primary) hypertension: Secondary | ICD-10-CM

## 2022-07-08 NOTE — Telephone Encounter (Signed)
Requested Prescriptions  Pending Prescriptions Disp Refills  . amLODipine (NORVASC) 10 MG tablet [Pharmacy Med Name: AMLODIPINE BESYLATE '10MG'$  TABLETS] 90 tablet 1    Sig: TAKE 1 TABLET BY MOUTH DAILY     Cardiovascular: Calcium Channel Blockers 2 Passed - 07/07/2022  6:19 AM      Passed - Last BP in normal range    BP Readings from Last 1 Encounters:  07/02/22 128/62         Passed - Last Heart Rate in normal range    Pulse Readings from Last 1 Encounters:  07/02/22 89         Passed - Valid encounter within last 6 months    Recent Outpatient Visits          6 days ago Essential (primary) hypertension   [facility], Jesse Sans, MD   1 month ago Essential (primary) hypertension   [place] [facility] at [facility], Jesse Sans, MD   6 months ago Essential (primary) hypertension   [place] [facility] at [facility], Jesse Sans, MD   7 months ago Essential (primary) hypertension   [place] [facility] at [facility], Jesse Sans, MD   1 year ago Annual physical exam   [facility] at [facility], Jesse Sans, MD      Future Appointments            In 2 months Army Melia, Jesse Sans, MD [facility] at [facility], [facility]           . omeprazole (PRILOSEC) 20 MG capsule [Pharmacy Med Name: OMEPRAZOLE '20MG'$  CAPSULES] 90 capsule 3    Sig: TAKE 1 CAPSULE(20 MG) BY MOUTH DAILY     Gastroenterology: Proton Pump Inhibitors Passed - 07/07/2022  6:19 AM      Passed - Valid encounter within last 12 months    Recent Outpatient Visits          6 days ago Essential (primary) hypertension   [place] [facility] at [facility], Jesse Sans, MD   1 month ago Essential (primary) hypertension   [place] [facility] at [facility], Jesse Sans, MD   6 months ago Essential (primary) hypertension   [place] [facility] at [facility], Jesse Sans, MD   7 months ago Essential (primary) hypertension   [place] [facility] at [facility], Jesse Sans, MD   1 year ago Annual physical exam   [facility] at [facility], Jesse Sans, MD      Future Appointments            In 2 months Army Melia, Jesse Sans, MD [place] [facility] at [facility]

## 2022-07-18 DIAGNOSIS — R002 Palpitations: Secondary | ICD-10-CM | POA: Diagnosis not present

## 2022-07-18 DIAGNOSIS — R9431 Abnormal electrocardiogram [ECG] [EKG]: Secondary | ICD-10-CM | POA: Diagnosis not present

## 2022-07-18 DIAGNOSIS — Z79899 Other long term (current) drug therapy: Secondary | ICD-10-CM | POA: Diagnosis not present

## 2022-07-18 DIAGNOSIS — R0602 Shortness of breath: Secondary | ICD-10-CM | POA: Diagnosis not present

## 2022-07-18 DIAGNOSIS — I4891 Unspecified atrial fibrillation: Secondary | ICD-10-CM | POA: Diagnosis not present

## 2022-07-18 DIAGNOSIS — R110 Nausea: Secondary | ICD-10-CM | POA: Diagnosis not present

## 2022-07-18 DIAGNOSIS — Z7982 Long term (current) use of aspirin: Secondary | ICD-10-CM | POA: Diagnosis not present

## 2022-07-18 DIAGNOSIS — I10 Essential (primary) hypertension: Secondary | ICD-10-CM | POA: Diagnosis not present

## 2022-07-18 LAB — HEPATIC FUNCTION PANEL
ALT: 14 U/L (ref 7–35)
AST: 19 (ref 13–35)
Alkaline Phosphatase: 38 (ref 25–125)
Bilirubin, Total: 0.4

## 2022-07-18 LAB — COMPREHENSIVE METABOLIC PANEL
Calcium: 9.8 (ref 8.7–10.7)
eGFR: 83

## 2022-07-18 LAB — CBC AND DIFFERENTIAL
HCT: 39 (ref 36–46)
Hemoglobin: 12.9 (ref 12.0–16.0)
Platelets: 362 10*3/uL (ref 150–400)
WBC: 8.4

## 2022-07-18 LAB — BASIC METABOLIC PANEL
BUN: 10 (ref 4–21)
CO2: 39 — AB (ref 13–22)
Chloride: 107 (ref 99–108)
Creatinine: 0.7 (ref 0.5–1.1)
Glucose: 114
Potassium: 3.6 mEq/L (ref 3.5–5.1)
Sodium: 142 (ref 137–147)

## 2022-07-18 LAB — TSH: TSH: 1.9 (ref 0.41–5.90)

## 2022-07-19 DIAGNOSIS — R002 Palpitations: Secondary | ICD-10-CM | POA: Diagnosis not present

## 2022-08-03 ENCOUNTER — Telehealth: Payer: Self-pay | Admitting: Internal Medicine

## 2022-08-03 NOTE — Telephone Encounter (Signed)
Called patient and left VM informing pt that the cardiologist she seen per her chart was Dr Nehemiah Massed.

## 2022-08-03 NOTE — Telephone Encounter (Signed)
Copied from [place] [id number]. Topic: General - Other >> Aug 03, 2022 11:35 AM Leitha Schuller wrote: Caller states [facility]  Caller states pt prefers to see the cardiologist she was referred to by her pcp  Caller requesting a cb to discuss request further

## 2022-08-10 DIAGNOSIS — I10 Essential (primary) hypertension: Secondary | ICD-10-CM | POA: Diagnosis not present

## 2022-08-10 DIAGNOSIS — R002 Palpitations: Secondary | ICD-10-CM | POA: Diagnosis not present

## 2022-08-27 DIAGNOSIS — R002 Palpitations: Secondary | ICD-10-CM | POA: Diagnosis not present

## 2022-08-30 DIAGNOSIS — I471 Supraventricular tachycardia, unspecified: Secondary | ICD-10-CM | POA: Diagnosis not present

## 2022-09-15 ENCOUNTER — Encounter: Payer: Self-pay | Admitting: Internal Medicine

## 2022-09-16 ENCOUNTER — Telehealth: Payer: Self-pay

## 2022-09-16 ENCOUNTER — Ambulatory Visit (INDEPENDENT_AMBULATORY_CARE_PROVIDER_SITE_OTHER): Payer: Medicare Other | Admitting: Internal Medicine

## 2022-09-16 ENCOUNTER — Encounter: Payer: Self-pay | Admitting: Internal Medicine

## 2022-09-16 ENCOUNTER — Other Ambulatory Visit
Admission: RE | Admit: 2022-09-16 | Discharge: 2022-09-16 | Disposition: A | Discharge status: Home / Self Care | Payer: Medicare Other | Attending: Internal Medicine | Admitting: Internal Medicine

## 2022-09-16 VITALS — BP 136/66 | HR 98 | Ht 66.0 in | Wt 127.0 lb

## 2022-09-16 DIAGNOSIS — R7303 Prediabetes: Secondary | ICD-10-CM | POA: Insufficient documentation

## 2022-09-16 DIAGNOSIS — I10 Essential (primary) hypertension: Secondary | ICD-10-CM | POA: Diagnosis not present

## 2022-09-16 DIAGNOSIS — Z1231 Encounter for screening mammogram for malignant neoplasm of breast: Secondary | ICD-10-CM

## 2022-09-16 DIAGNOSIS — E782 Mixed hyperlipidemia: Secondary | ICD-10-CM | POA: Diagnosis not present

## 2022-09-16 DIAGNOSIS — I700 Atherosclerosis of aorta: Secondary | ICD-10-CM | POA: Diagnosis not present

## 2022-09-16 DIAGNOSIS — R3121 Asymptomatic microscopic hematuria: Secondary | ICD-10-CM | POA: Diagnosis not present

## 2022-09-16 DIAGNOSIS — K222 Esophageal obstruction: Secondary | ICD-10-CM | POA: Diagnosis not present

## 2022-09-16 DIAGNOSIS — Z0000 Encounter for general adult medical examination without abnormal findings: Secondary | ICD-10-CM | POA: Diagnosis not present

## 2022-09-16 LAB — LIPID PANEL
Cholesterol: 215 mg/dL — ABNORMAL HIGH (ref 0–200)
HDL: 80 mg/dL (ref >40)
LDL Cholesterol: 120 mg/dL — ABNORMAL HIGH (ref 0–99)
Total CHOL/HDL Ratio: 2.7 RATIO
Triglycerides: 74 mg/dL (ref <150)
VLDL: 15 mg/dL (ref 0–40)

## 2022-09-16 LAB — POCT URINALYSIS DIPSTICK
Bilirubin, UA: NEGATIVE
Glucose, UA: NEGATIVE
Ketones, UA: NEGATIVE
Leukocytes, UA: NEGATIVE
Nitrite, UA: NEGATIVE
Protein, UA: NEGATIVE
Spec Grav, UA: 1.01 (ref 1.010–1.025)
Urobilinogen, UA: 0.2 E.U./dL
pH, UA: 6.5 (ref 5.0–8.0)

## 2022-09-16 NOTE — Telephone Encounter (Signed)
Pt returned our call.  Made follow up appt.  Pt would like more information regarding reasons for blood in urine.   Please return her call.  Clista Bernhardt, [place]      09/16/22 11:10 AM Note Tried calling pt to inform her that she had microscopic blood in her urine today. Dr Army Melia wants to see her for follow up of BP and urine in 4 months. Please schedule follow up if patient calls back.

## 2022-09-16 NOTE — Assessment & Plan Note (Signed)
Clinically stable exam with well controlled BP on four agents Tolerating medications without side effects at this time. Pt to continue current regimen and low sodium diet; benefits of regular exercise as able discussed.

## 2022-09-16 NOTE — Assessment & Plan Note (Signed)
Noted on CT. No on statin due to patient choice and age Recommend healthy low fat diet

## 2022-09-16 NOTE — Progress Notes (Signed)
Date:  09/16/2022   Name:  Kendra Lane   DOB:  11/08/1934   MRN:  [id number]   Chief Complaint: Annual Exam Kendra Lane is a 88 y.o. female who presents today for her Complete Annual Exam. She feels well. She reports exercising walking. She reports she is sleeping well. Breast complaints none.  Mammogram: 12/2021 DEXA: 06/2018 Colonoscopy: aged out  Health Maintenance Due  Topic Date Due   DTaP/Tdap/Td (1 - Tdap) Never done   Zoster Vaccines- Shingrix (1 of 2) Never done    Immunization History  Administered Date(s) Administered   Moderna Sars-Covid-2 Vaccination 02/25/2020, 03/24/2020   Pneumococcal Conjugate-13 04/07/2016   Pneumococcal Polysaccharide-23 06/26/2012    Hypertension This is a chronic problem. The problem is controlled. Pertinent negatives include no chest pain, headaches, palpitations or shortness of breath. Past treatments include calcium channel blockers, angiotensin blockers, beta blockers and diuretics. The current treatment provides significant improvement. There are no compliance problems.  There is no history of kidney disease, CAD/MI or CVA.  Gastroesophageal Reflux She complains of nausea. She reports no abdominal pain, no chest pain, no coughing, no dysphagia (hx of dyphagia and stricture) or no wheezing. The problem occurs rarely. Pertinent negatives include no fatigue. She has tried a PPI for the symptoms. Past procedures include an EGD.    Lab Results  Component Value Date   NA 142 07/18/2022   K 3.6 07/18/2022   CO2 39 (A) 07/18/2022   GLUCOSE 108 (H) 05/10/2022   BUN 10 07/18/2022   CREATININE 0.7 07/18/2022   CALCIUM 9.8 07/18/2022   EGFR 83 07/18/2022   GFRNONAA >60 05/10/2022   Lab Results  Component Value Date   CHOL 218 (H) 06/16/2021   HDL 87 06/16/2021   LDLCALC 119 (H) 06/16/2021   TRIG 68 06/16/2021   CHOLHDL 2.5 06/16/2021   Lab Results  Component Value Date   TSH 1.90 07/18/2022   Lab Results  Component  Value Date   HGBA1C 5.7 (H) 06/12/2019   Lab Results  Component Value Date   WBC 8.4 07/18/2022   HGB 12.9 07/18/2022   HCT 39 07/18/2022   MCV 90 06/16/2021   PLT 362 07/18/2022   Lab Results  Component Value Date   ALT 14 07/18/2022   AST 19 07/18/2022   ALKPHOS 38 07/18/2022   BILITOT 0.3 06/16/2021   Lab Results  Component Value Date   VD25OH 36.4 06/16/2021     Review of Systems  Constitutional:  Negative for chills, fatigue and fever.  HENT:  Negative for congestion, hearing loss, tinnitus, trouble swallowing and voice change.   Eyes:  Negative for visual disturbance.  Respiratory:  Negative for cough, chest tightness, shortness of breath and wheezing.   Cardiovascular:  Negative for chest pain, palpitations and leg swelling.  Gastrointestinal:  Positive for nausea. Negative for abdominal pain, constipation, diarrhea, dysphagia (hx of dyphagia and stricture) and vomiting.  Endocrine: Negative for polydipsia and polyuria.  Genitourinary:  Negative for dysuria, frequency, genital sores, vaginal bleeding and vaginal discharge.  Musculoskeletal:  Negative for arthralgias, gait problem and joint swelling.  Skin:  Negative for color change and rash.  Neurological:  Negative for dizziness, tremors, light-headedness and headaches.  Hematological:  Negative for adenopathy. Does not bruise/bleed easily.  Psychiatric/Behavioral:  Negative for dysphoric mood and sleep disturbance. The patient is not nervous/anxious.     Patient Active Problem List   Diagnosis Date Noted   Mixed hyperlipidemia 09/16/2022  Prediabetes 09/16/2022   Asymptomatic microscopic hematuria 09/16/2022   Age-related osteoporosis without current pathological fracture 06/12/2020   Aortic atherosclerosis ([place]) 04/02/2020   Arthritis of shoulder 11/30/2017   Bigeminy 04/07/2016   Benign esophageal stricture 01/21/2015   Essential (primary) hypertension 01/21/2015   Hammer toe 01/21/2015   Lipoma of skin  and subcutaneous tissue of trunk 01/21/2015    Allergies  Allergen Reactions   Valsartan Other (See Comments)    dizziness    Past Surgical History:  Procedure Laterality Date   ABDOMINAL HYSTERECTOMY     CATARACT EXTRACTION W/PHACO Right 07/23/2020   Procedure: CATARACT EXTRACTION PHACO AND INTRAOCULAR LENS PLACEMENT (IOC) RIGHT 7.62 01:26.5 8.8%;  Surgeon: Leandrew Koyanagi, MD;  Location: [place];  Service: Ophthalmology;  Laterality: Right;   CATARACT EXTRACTION W/PHACO Left 04/01/2021   Procedure: CATARACT EXTRACTION PHACO AND INTRAOCULAR LENS PLACEMENT (IOC) LEFT 3.64 00:56.9;  Surgeon: Leandrew Koyanagi, MD;  Location: [place];  Service: Ophthalmology;  Laterality: Left;   ESOPHAGOGASTRODUODENOSCOPY (EGD) WITH ESOPHAGEAL DILATION  10/2014   Dr. Allen Norris   TOTAL VAGINAL HYSTERECTOMY  1993   TUBAL LIGATION      Social History   Tobacco Use   Smoking status: Never   Smokeless tobacco: Never   Tobacco comments:    smoking cessation materials not required  Vaping Use   Vaping Use: Never used  Substance Use Topics   Alcohol use: Not Currently    Alcohol/week: 2.0 standard drinks of alcohol    Types: 2 Cans of beer per week    Comment: last use 08/2019   Drug use: No     Medication list has been reviewed and updated.  Current Meds  Medication Sig   amLODipine (NORVASC) 10 MG tablet TAKE 1 TABLET BY MOUTH DAILY   aspirin 81 MG tablet Take 81 mg by mouth daily.   calcium-vitamin D (OSCAL WITH D) 500-200 MG-UNIT tablet Take 1 tablet by mouth. 600 mg daily   diclofenac Sodium (VOLTAREN) 1 % GEL Apply 2 g topically 4 (four) times daily.   metoprolol succinate (TOPROL-XL) 25 MG 24 hr tablet Take 3 tablets (75 mg total) by mouth daily.   Multiple Vitamins-Minerals (CENTRUM SILVER 50+WOMEN PO) Take 1 tablet by mouth daily.   olmesartan (BENICAR) 40 MG tablet TAKE 1 TABLET(40 MG) BY MOUTH DAILY   omeprazole (PRILOSEC) 20 MG capsule TAKE 1 CAPSULE(20  MG) BY MOUTH DAILY   spironolactone (ALDACTONE) 25 MG tablet TAKE 1 TABLET(25 MG) BY MOUTH DAILY       09/16/2022    9:48 AM 07/02/2022   10:00 AM 05/10/2022   10:54 AM 01/04/2022    1:31 PM  GAD 7 : Generalized Anxiety Score  Nervous, Anxious, on Edge _0 Control/stop worrying 2 2 0 3  Worry too much - different things _1 Trouble relaxing 0 1 0 2  Restless 1 0 0 2  Easily annoyed or irritable _2 Afraid - awful might happen 0 1 0 2  Total GAD 7 Score _3 Anxiety Difficulty Not difficult at all Not difficult at all Somewhat difficult Somewhat difficult       09/16/2022    9:47 AM 07/02/2022    9:59 AM 06/09/2022    9:50 AM  Depression screen PHQ 2/9  Decreased Interest 2 0 2  Down, Depressed, Hopeless 1 0   PHQ - 2 Score 3 0 2  Altered sleeping  _0 Tired, decreased energy 2 0 3  Change in appetite 0 0 0  Feeling bad or failure about yourself  0 0 0  Trouble concentrating 1 1 0  Moving slowly or fidgety/restless 0 1 0  Suicidal thoughts 0 0 0  PHQ-9 Score _1 Difficult doing work/chores Not difficult at all Not difficult at all Somewhat difficult    BP Readings from Last 3 Encounters:  09/16/22 136/66  07/02/22 128/62  06/14/22 136/78    Physical Exam Vitals and nursing note reviewed.  Constitutional:      General: She is not in acute distress.    Appearance: She is well-developed.  HENT:     Head: Normocephalic and atraumatic.     Right Ear: Tympanic membrane and ear canal normal.     Left Ear: Tympanic membrane and ear canal normal.     Nose:     Right Sinus: No maxillary sinus tenderness.     Left Sinus: No maxillary sinus tenderness.  Eyes:     General: No scleral icterus.       Right eye: No discharge.        Left eye: No discharge.     Conjunctiva/sclera: Conjunctivae normal.  Neck:     Thyroid: No thyromegaly.     Vascular: No carotid bruit.  Cardiovascular:     Rate and Rhythm: Normal rate and regular rhythm.      Pulses: Normal pulses.     Heart sounds: Normal heart sounds.     Comments: Intermittent episodes of increased HR Pulmonary:     Effort: Pulmonary effort is normal. No respiratory distress.     Breath sounds: No wheezing.  Chest:  Breasts:    Right: No mass, nipple discharge, skin change or tenderness.     Left: No mass, nipple discharge, skin change or tenderness.  Abdominal:     General: Bowel sounds are normal.     Palpations: Abdomen is soft.     Tenderness: There is no abdominal tenderness.  Musculoskeletal:     Cervical back: Normal range of motion. No erythema.     Right lower leg: No edema.     Left lower leg: No edema.  Lymphadenopathy:     Cervical: No cervical adenopathy.  Skin:    General: Skin is warm and dry.     Capillary Refill: Capillary refill takes less than 2 seconds.     Findings: No rash.  Neurological:     General: No focal deficit present.     Mental Status: She is alert and oriented to person, place, and time.     Cranial Nerves: No cranial nerve deficit.     Sensory: No sensory deficit.     Deep Tendon Reflexes: Reflexes are normal and symmetric.  Psychiatric:        Attention and Perception: Attention normal.        Mood and Affect: Mood normal.     Wt Readings from Last 3 Encounters:  09/16/22 127 lb (57.6 kg)  07/02/22 125 lb (56.7 kg)  06/14/22 120 lb (54.4 kg)    BP 136/66 (BP Location: Left Arm, Cuff Size: Normal)   Pulse 98   Ht _2  (1.676 m)   Wt 127 lb (57.6 kg)   SpO2 97%   BMI 20.50 kg/m   Assessment and Plan: Problem List Items Addressed This Visit       Cardiovascular and Mediastinum   Aortic atherosclerosis ([place]) (Chronic)  Noted on CT. No on statin due to patient choice and age Recommend healthy low fat diet       Essential (primary) hypertension (Chronic)    Clinically stable exam with well controlled BP on four agents Tolerating medications without side effects at this time. Pt to continue current regimen  and low sodium diet; benefits of regular exercise as able discussed.       Relevant Orders   POCT urinalysis dipstick (Completed)     Digestive   Benign esophageal stricture (Chronic)    Symptoms well controlled on daily PPI No red flag signs such as weight loss, n/v, melena. She does have some mild intermittent nausea for unclear reasons. Would consider H Pylori testing if worsening No recurrence of dysphagia. Will continue omeprazole.         Genitourinary   Asymptomatic microscopic hematuria     Other   Mixed hyperlipidemia (Chronic)   Relevant Orders   Lipid panel   Prediabetes (Chronic)   Relevant Orders   Hemoglobin A1c   Other Visit Diagnoses     Annual physical exam    -  Primary   Rec Tdap if she is injured aged out of colonoscopies will continue annual mammograms   Encounter for screening mammogram for breast cancer       Relevant Orders   MM 3D SCREEN BREAST BILATERAL        Partially dictated using Editor, commissioning. Any errors are unintentional.  Halina Maidens, MD [facility]  09/16/2022

## 2022-09-16 NOTE — Telephone Encounter (Signed)
Tried calling pt to inform her that she had microscopic blood in her urine today. Dr Army Melia wants to see her for follow up of BP and urine in 4 months. Please schedule follow up if patient calls back.

## 2022-09-16 NOTE — Assessment & Plan Note (Addendum)
Symptoms well controlled on daily PPI No red flag signs such as weight loss, n/v, melena. She does have some mild intermittent nausea for unclear reasons. Would consider H Pylori testing if worsening No recurrence of dysphagia. Will continue omeprazole.

## 2022-09-17 LAB — HEMOGLOBIN A1C
Hgb A1c MFr Bld: 5.8 % — ABNORMAL HIGH (ref 4.8–5.6)
Mean Plasma Glucose: 120 mg/dL

## 2022-10-13 ENCOUNTER — Ambulatory Visit: Payer: Self-pay | Admitting: *Deleted

## 2022-10-13 NOTE — Telephone Encounter (Signed)
Summary: weak at the stomach feeling   Pt feels weak at the stomach every day for a couple hrs / she feels like she hasn't ate anything / pt stated she mentioned this at last visit and asked if something can be taken for this / please advise           Chief Complaint: abdominal discomfort Symptoms: denies abdominal pain no nausea . Reports stomach "feels weak" and comes and goes. Middle of abdomen. Has to lay down for approx 30 min. To feel better . Happens every day . Happens whether eating or not. Has tried Tums, alkazelters, baking soda with little relief. Reports notifying PCP last OV 09/16/22 and sx not changed Frequency: 1 month  Pertinent Negatives: Patient denies fever, no N/V, no diarrhea Disposition: '[]'$ ED /'[]'$ Urgent Care (no appt availability in office) / '[]'$ Appointment(In office/virtual)/ '[]'$  [facility]/ '[]'$ Home Care/ '[]'$ Refused Recommended Disposition /'[]'$ [facility]/ '[x]'$  Follow-up with PCP Additional Notes:   Recommended appt. Patient reports she just saw PCP 09/16/22. Requesting advise regarding stomach weakness. Please advise if appt needed.        Reason for Disposition  Abdominal pain is a chronic symptom (recurrent or ongoing AND present > 4 weeks)  Answer Assessment - Initial Assessment Questions 1. LOCATION: "Where does it hurt?"     Middle of stomach  2. RADIATION: "Does the pain shoot anywhere else?" (e.g., chest, back)     No  3. ONSET: "When did the pain begin?" (e.g., minutes, hours or days ago)      Approx 1 month ago  4. SUDDEN: "Gradual or sudden onset?"     Na  5. PATTERN "Does the pain come and go, or is it constant?"    - If it comes and goes: "How long does it last?" "Do you have pain now?"     (Note: Comes and goes means the pain is intermittent. It goes away completely between bouts.)    - If constant: "Is it getting better, staying the same, or getting worse?"      (Note: Constant means the pain never goes away completely; most  serious pain is constant and gets worse.)      Comes and goes  6. SEVERITY: "How bad is the pain?"  (e.g., Scale 1-10; mild, moderate, or severe)    - MILD (1-3): Doesn't interfere with normal activities, abdomen soft and not tender to touch.     - MODERATE (4-7): Interferes with normal activities or awakens from sleep, abdomen tender to touch.     - SEVERE (8-10): Excruciating pain, doubled over, unable to do any normal activities.       Stomach feels "weak" like its empty when she has just eaten.  7. RECURRENT SYMPTOM: "Have you ever had this type of stomach pain before?" If Yes, ask: "When was the last time?" and "What happened that time?"      Yes reports reviewed with PCP last month  8. CAUSE: "What do you think is causing the stomach pain?"     Not sure  9. RELIEVING/AGGRAVATING FACTORS: "What makes it better or worse?" (e.g., antacids, bending or twisting motion, bowel movement)     Has taken tums, baking soda, alkazelter with little relief but comes back. 10. OTHER SYMPTOMS: "Do you have any other symptoms?" (e.g., back pain, diarrhea, fever, urination pain, vomiting)      "Weak " on stomach 11. PREGNANCY: "Is there any chance you are pregnant?" "When was your last menstrual  period?"       na  Protocols used: Abdominal Pain - Female-A-AH

## 2022-10-14 ENCOUNTER — Ambulatory Visit (INDEPENDENT_AMBULATORY_CARE_PROVIDER_SITE_OTHER): Payer: Medicare Other | Admitting: Internal Medicine

## 2022-10-14 ENCOUNTER — Encounter: Payer: Self-pay | Admitting: Internal Medicine

## 2022-10-14 VITALS — BP 136/76 | HR 77 | Ht 66.0 in | Wt 126.0 lb

## 2022-10-14 DIAGNOSIS — I10 Essential (primary) hypertension: Secondary | ICD-10-CM | POA: Diagnosis not present

## 2022-10-14 DIAGNOSIS — K222 Esophageal obstruction: Secondary | ICD-10-CM | POA: Diagnosis not present

## 2022-10-14 DIAGNOSIS — R3121 Asymptomatic microscopic hematuria: Secondary | ICD-10-CM

## 2022-10-14 NOTE — Assessment & Plan Note (Addendum)
Symptoms well controlled on daily PPI No red flag signs such as weight loss, n/v, melena She feels slightly hungry all the time and grazes at home. Recommend high protein meals and snacks Should continue PPI due to hx of stricture

## 2022-10-14 NOTE — Assessment & Plan Note (Signed)
Clinically stable exam with well controlled BP on four medications. Tolerating medications without side effects. Pt to continue current regimen and low sodium diet.

## 2022-10-14 NOTE — Progress Notes (Signed)
Date:  10/14/2022   Name:  Kendra Lane   DOB:  September 05, 1935   MRN:  [id number]   Chief Complaint: abdomin problem (Patient said she feels "weak" in her stomach - Explained she feels like she haven't ate anything even when she has. )  Gastroesophageal Reflux She reports no abdominal pain, no belching, no chest pain, no coughing, no dysphagia, no heartburn, no nausea or no wheezing. Pertinent negatives include no fatigue. Hungry constantly even if she eats.. She has tried a PPI for the symptoms. Past procedures include an EGD.  Hypertension This is a chronic problem. The problem is controlled. Pertinent negatives include no chest pain or headaches. Past treatments include beta blockers, calcium channel blockers, angiotensin blockers and diuretics.  Hematuria - noted last visit without symptoms.  Will recheck today.   Lab Results  Component Value Date   NA 142 07/18/2022   K 3.6 07/18/2022   CO2 39 (A) 07/18/2022   GLUCOSE 108 (H) 05/10/2022   BUN 10 07/18/2022   CREATININE 0.7 07/18/2022   CALCIUM 9.8 07/18/2022   EGFR 83 07/18/2022   GFRNONAA >60 05/10/2022   Lab Results  Component Value Date   CHOL 215 (H) 09/16/2022   HDL 80 09/16/2022   LDLCALC 120 (H) 09/16/2022   TRIG 74 09/16/2022   CHOLHDL 2.7 09/16/2022   Lab Results  Component Value Date   TSH 1.90 07/18/2022   Lab Results  Component Value Date   HGBA1C 5.8 (H) 09/16/2022   Lab Results  Component Value Date   WBC 8.4 07/18/2022   HGB 12.9 07/18/2022   HCT 39 07/18/2022   MCV 90 06/16/2021   PLT 362 07/18/2022   Lab Results  Component Value Date   ALT 14 07/18/2022   AST 19 07/18/2022   ALKPHOS 38 07/18/2022   BILITOT 0.3 06/16/2021   Lab Results  Component Value Date   VD25OH 36.4 06/16/2021     Review of Systems  Constitutional:  Positive for appetite change. Negative for diaphoresis, fatigue, fever and unexpected weight change.  Respiratory:  Negative for cough and wheezing.    Cardiovascular:  Negative for chest pain and leg swelling.  Gastrointestinal:  Negative for abdominal distention, abdominal pain, constipation, diarrhea, dysphagia, heartburn, nausea and vomiting.  Genitourinary:  Negative for dysuria, hematuria and urgency.  Neurological:  Negative for dizziness, light-headedness and headaches.    Patient Active Problem List   Diagnosis Date Noted   Mixed hyperlipidemia 09/16/2022   Prediabetes 09/16/2022   Asymptomatic microscopic hematuria 09/16/2022   Age-related osteoporosis without current pathological fracture 06/12/2020   Aortic atherosclerosis ([place]) 04/02/2020   Arthritis of shoulder 11/30/2017   Bigeminy 04/07/2016   Benign esophageal stricture 01/21/2015   Essential (primary) hypertension 01/21/2015   Hammer toe 01/21/2015   Lipoma of skin and subcutaneous tissue of trunk 01/21/2015    Allergies  Allergen Reactions   Valsartan Other (See Comments)    dizziness    Past Surgical History:  Procedure Laterality Date   ABDOMINAL HYSTERECTOMY     CATARACT EXTRACTION W/PHACO Right 07/23/2020   Procedure: CATARACT EXTRACTION PHACO AND INTRAOCULAR LENS PLACEMENT (IOC) RIGHT 7.62 01:26.5 8.8%;  Surgeon: Leandrew Koyanagi, MD;  Location: [place];  Service: Ophthalmology;  Laterality: Right;   CATARACT EXTRACTION W/PHACO Left 04/01/2021   Procedure: CATARACT EXTRACTION PHACO AND INTRAOCULAR LENS PLACEMENT (IOC) LEFT 3.64 00:56.9;  Surgeon: Leandrew Koyanagi, MD;  Location: [place];  Service: Ophthalmology;  Laterality: Left;   ESOPHAGOGASTRODUODENOSCOPY (  EGD) WITH ESOPHAGEAL DILATION  10/2014   Dr. Allen Norris   TOTAL VAGINAL HYSTERECTOMY  1993   TUBAL LIGATION      Social History   Tobacco Use   Smoking status: Never   Smokeless tobacco: Never   Tobacco comments:    smoking cessation materials not required  Vaping Use   Vaping Use: Never used  Substance Use Topics   Alcohol use: Not Currently    Alcohol/week:  2.0 standard drinks of alcohol    Types: 2 Cans of beer per week    Comment: last use 08/2019   Drug use: No     Medication list has been reviewed and updated.  Current Meds  Medication Sig   amLODipine (NORVASC) 10 MG tablet TAKE 1 TABLET BY MOUTH DAILY   aspirin 81 MG tablet Take 81 mg by mouth daily.   calcium-vitamin D (OSCAL WITH D) 500-200 MG-UNIT tablet Take 1 tablet by mouth. 600 mg daily   diclofenac Sodium (VOLTAREN) 1 % GEL Apply 2 g topically 4 (four) times daily.   metoprolol succinate (TOPROL-XL) 25 MG 24 hr tablet Take 3 tablets (75 mg total) by mouth daily.   Multiple Vitamins-Minerals (CENTRUM SILVER 50+WOMEN PO) Take 1 tablet by mouth daily.   olmesartan (BENICAR) 40 MG tablet TAKE 1 TABLET(40 MG) BY MOUTH DAILY   omeprazole (PRILOSEC) 20 MG capsule TAKE 1 CAPSULE(20 MG) BY MOUTH DAILY   spironolactone (ALDACTONE) 25 MG tablet TAKE 1 TABLET(25 MG) BY MOUTH DAILY       10/14/2022    9:28 AM 09/16/2022    9:48 AM 07/02/2022   10:00 AM 05/10/2022   10:54 AM  GAD 7 : Generalized Anxiety Score  Nervous, Anxious, on Edge '2 2 2 1  '$ Control/stop worrying '3 2 2 '$ 0  Worry too much - different things '3 2 3 2  '$ Trouble relaxing 0 0 1 0  Restless 1 1 0 0  Easily annoyed or irritable '2 1 1 1  '$ Afraid - awful might happen 0 0 1 0  Total GAD 7 Score '11 8 10 4  '$ Anxiety Difficulty Somewhat difficult Not difficult at all Not difficult at all Somewhat difficult       10/14/2022    9:28 AM 09/16/2022    9:47 AM 07/02/2022    9:59 AM  Depression screen PHQ 2/9  Decreased Interest 1 2 0  Down, Depressed, Hopeless 1 1 0  PHQ - 2 Score 2 3 0  Altered sleeping '2 2 1  '$ Tired, decreased energy 2 2 0  Change in appetite 2 0 0  Feeling bad or failure about yourself  0 0 0  Trouble concentrating 0 1 1  Moving slowly or fidgety/restless 0 0 1  Suicidal thoughts 0 0 0  PHQ-9 Score '8 8 3  '$ Difficult doing work/chores Not difficult at all Not difficult at all Not difficult at all    BP  Readings from Last 3 Encounters:  10/14/22 136/76  09/16/22 136/66  07/02/22 128/62    Physical Exam Vitals and nursing note reviewed.  Constitutional:      General: She is not in acute distress.    Appearance: Normal appearance. She is well-developed.  HENT:     Head: Normocephalic and atraumatic.  Neck:     Vascular: No carotid bruit.  Cardiovascular:     Rate and Rhythm: Normal rate and regular rhythm.  Pulmonary:     Effort: Pulmonary effort is normal. No respiratory distress.  Breath sounds: No wheezing or rhonchi.  Abdominal:     General: Abdomen is flat. There is no distension.     Palpations: Abdomen is soft. There is no mass.     Tenderness: There is no right CVA tenderness or left CVA tenderness.  Musculoskeletal:     Cervical back: Normal range of motion.     Right lower leg: No edema.     Left lower leg: No edema.  Lymphadenopathy:     Cervical: No cervical adenopathy.  Skin:    General: Skin is warm and dry.     Findings: No rash.  Neurological:     General: No focal deficit present.     Mental Status: She is alert and oriented to person, place, and time.  Psychiatric:        Mood and Affect: Mood normal.        Behavior: Behavior normal.     Wt Readings from Last 3 Encounters:  10/14/22 126 lb (57.2 kg)  09/16/22 127 lb (57.6 kg)  07/02/22 125 lb (56.7 kg)    BP 136/76 (BP Location: Right Arm, Cuff Size: Normal)   Pulse 77   Ht '5\' 6"'$  (1.676 m)   Wt 126 lb (57.2 kg)   SpO2 97%   BMI 20.34 kg/m   Assessment and Plan: Problem List Items Addressed This Visit       Cardiovascular and Mediastinum   Essential (primary) hypertension (Chronic)    Clinically stable exam with well controlled BP on four medications. Tolerating medications without side effects. Pt to continue current regimen and low sodium diet.         Digestive   Benign esophageal stricture - Primary (Chronic)    Symptoms well controlled on daily PPI No red flag signs such  as weight loss, n/v, melena She feels slightly hungry all the time and grazes at home. Recommend high protein meals and snacks Should continue PPI due to hx of stricture          Genitourinary   Asymptomatic microscopic hematuria    Noted Jan 2023 without symptoms Will repeat UA today      Relevant Orders   Urinalysis, Routine w reflex microscopic     Partially dictated using Editor, commissioning. Any errors are unintentional.  Halina Maidens, MD [facility]  10/14/2022

## 2022-10-14 NOTE — Assessment & Plan Note (Signed)
Noted Jan 2023 without symptoms Will repeat UA today

## 2022-10-15 LAB — URINALYSIS, ROUTINE W REFLEX MICROSCOPIC
Bilirubin, UA: NEGATIVE
Glucose, UA: NEGATIVE
Ketones, UA: NEGATIVE
Leukocytes,UA: NEGATIVE
Nitrite, UA: NEGATIVE
Protein,UA: NEGATIVE
RBC, UA: NEGATIVE
Specific Gravity, UA: 1.017 (ref 1.005–1.030)
Urobilinogen, Ur: 1 mg/dL (ref 0.2–1.0)
pH, UA: 6 (ref 5.0–7.5)

## 2022-10-30 DIAGNOSIS — R531 Weakness: Secondary | ICD-10-CM | POA: Diagnosis not present

## 2022-10-30 DIAGNOSIS — I10 Essential (primary) hypertension: Secondary | ICD-10-CM | POA: Diagnosis not present

## 2022-10-30 DIAGNOSIS — Z7982 Long term (current) use of aspirin: Secondary | ICD-10-CM | POA: Diagnosis not present

## 2022-10-30 DIAGNOSIS — I491 Atrial premature depolarization: Secondary | ICD-10-CM | POA: Diagnosis not present

## 2022-10-30 DIAGNOSIS — Z888 Allergy status to other drugs, medicaments and biological substances status: Secondary | ICD-10-CM | POA: Diagnosis not present

## 2022-10-30 DIAGNOSIS — R110 Nausea: Secondary | ICD-10-CM | POA: Diagnosis not present

## 2022-11-01 ENCOUNTER — Telehealth: Payer: Self-pay

## 2022-11-01 NOTE — Transitions of Care (Post Inpatient/ED Visit) (Signed)
   11/01/2022  Name: Kendra Lane MRN: [id number] DOB: 06/30/1935  Today's TOC FU Call Status:    Transition Care Management Follow-up Telephone Call Date of Discharge: 10/30/22 Discharge Facility: Other ([facility]) Name of Other ([facility]) Discharge Facility: [facility] Type of Discharge: Emergency Department Reason for ED Visit: Other: (Nausea) How have you been since you were released from the hospital?: Better Any questions or concerns?: No  Items Reviewed: Did you receive and understand the discharge instructions provided?: Yes Medications obtained and verified?: Yes (Medications Reviewed) Any new allergies since your discharge?: No Dietary orders reviewed?: No Do you have support at home?: Yes People in Home: spouse  Home Care and Equipment/Supplies: [place] Ordered?: No Any new equipment or medical supplies ordered?: No  Functional Questionnaire: Do you need assistance with bathing/showering or dressing?: No Do you need assistance with meal preparation?: No Do you need assistance with eating?: No Do you have difficulty maintaining continence: No Do you need assistance with getting out of bed/getting out of a chair/moving?: No Do you have difficulty managing or taking your medications?: No  Folllow up appointments reviewed: PCP Follow-up appointment confirmed?: No MD Provider Line [phone] Given: Yes [facility] Follow-up appointment confirmed?: No Do you need transportation to your follow-up appointment?: No Do you understand care options if your condition(s) worsen?: Yes-patient verbalized understanding    [facility] [place]  [place] [address] Office [phone]  Fax: [phone]

## 2022-11-22 ENCOUNTER — Other Ambulatory Visit: Payer: Self-pay | Admitting: Internal Medicine

## 2022-11-22 DIAGNOSIS — I10 Essential (primary) hypertension: Secondary | ICD-10-CM

## 2022-11-22 DIAGNOSIS — Z961 Presence of intraocular lens: Secondary | ICD-10-CM | POA: Diagnosis not present

## 2022-11-22 DIAGNOSIS — H40003 Preglaucoma, unspecified, bilateral: Secondary | ICD-10-CM | POA: Diagnosis not present

## 2022-11-23 NOTE — Telephone Encounter (Signed)
Requested Prescriptions  Pending Prescriptions Disp Refills   olmesartan (BENICAR) 40 MG tablet [Pharmacy Med Name: OLMESARTAN MEDOXOMIL '40MG'$  TABLETS] 90 tablet 0    Sig: TAKE 1 TABLET(40 MG) BY MOUTH DAILY     Cardiovascular:  Angiotensin Receptor Blockers Passed - 11/22/2022 10:06 AM      Passed - Cr in normal range and within 180 days    Creatinine  Date Value Ref Range Status  07/18/2022 0.7 0.5 - 1.1 Final  01/22/2014 0.60 0.60 - 1.30 mg/dL Final   Creatinine, Ser  Date Value Ref Range Status  05/10/2022 0.66 0.44 - 1.00 mg/dL Final         Passed - K in normal range and within 180 days    Potassium  Date Value Ref Range Status  07/18/2022 3.6 3.5 - 5.1 mEq/L Final  01/22/2014 3.8 3.5 - 5.1 mmol/L Final         Passed - Patient is not pregnant      Passed - Last BP in normal range    BP Readings from Last 1 Encounters:  10/14/22 136/76         Passed - Valid encounter within last 6 months    Recent Outpatient Visits           1 month ago Benign esophageal stricture   [facility] [place], Jesse Sans, MD   2 months ago Annual physical exam   [place] at [facility], Jesse Sans, MD   4 months ago Essential (primary) hypertension   [place] [facility] at [facility], Jesse Sans, MD   6 months ago Essential (primary) hypertension   [place] [facility] [place] [facility], Jesse Sans, MD   10 months ago Essential (primary) hypertension   [facility] at [facility], Jesse Sans, MD       Future Appointments             In 1 month [facility], Jesse Sans, MD [place] at [facility], [facility]

## 2022-11-29 ENCOUNTER — Ambulatory Visit
Admission: RE | Admit: 2022-11-29 | Discharge: 2022-11-29 | Disposition: A | Discharge status: Home / Self Care | Payer: Medicare Other | Source: Ambulatory Visit | Attending: Internal Medicine | Admitting: Internal Medicine

## 2022-11-29 DIAGNOSIS — Z1231 Encounter for screening mammogram for malignant neoplasm of breast: Secondary | ICD-10-CM | POA: Diagnosis not present

## 2022-12-24 ENCOUNTER — Other Ambulatory Visit: Payer: Self-pay | Admitting: Internal Medicine

## 2022-12-24 DIAGNOSIS — I10 Essential (primary) hypertension: Secondary | ICD-10-CM

## 2023-01-05 ENCOUNTER — Ambulatory Visit (INDEPENDENT_AMBULATORY_CARE_PROVIDER_SITE_OTHER): Payer: Medicare Other

## 2023-01-05 ENCOUNTER — Ambulatory Visit
Admission: EM | Admit: 2023-01-05 | Discharge: 2023-01-05 | Disposition: A | Discharge status: Home / Self Care | Payer: Medicare Other | Attending: Family Medicine | Admitting: Family Medicine

## 2023-01-05 DIAGNOSIS — M25552 Pain in left hip: Secondary | ICD-10-CM

## 2023-01-05 DIAGNOSIS — D239 Other benign neoplasm of skin, unspecified: Secondary | ICD-10-CM

## 2023-01-05 MED ORDER — MUPIROCIN 2 % EX OINT: 1.0000 | TOPICAL_OINTMENT | Freq: Two times a day (BID) | CUTANEOUS | 0 refills | Status: DC

## 2023-01-05 MED ORDER — KETOROLAC TROMETHAMINE 30 MG/ML IJ SOLN
30.0000 mg | Freq: Once | INTRAMUSCULAR | Status: AC
  Administered 2023-01-05: 30 mg via INTRAMUSCULAR

## 2023-01-05 NOTE — ED Provider Notes (Signed)
[facility]    CSN: [id number] Arrival date & time: 01/05/23  4098      History   Chief Complaint Chief Complaint  Patient presents with   Back Pain    HPI  HPI Kendra Lane is a 88 y.o. female.   Kendra Lane presents for low back pain that started 6 days ago after lifting buckets of water for her flowers.  Pain is worse at night and then it wakes her up because she starts tossing before.  Took Tylenol without relief.  Pain is described as "nagging" and with radiation to her left hip.  Pain rated 4/10 currently but at night 8/10. Denies weakness, numbness, tingling, dysuria, leg pain, leg weakness, abdominal pain, new incontinence. She jas never injured her back before.  No history of causea, unintended weight loss, night sweats or fever.     Past Medical History:  Diagnosis Date   Dysrhythmia    hx of, ok now   GERD (gastroesophageal reflux disease)    Headache    twice a day sometimes   Hypertension    Neck pain    Shoulder pain, left     Patient Active Problem List   Diagnosis Date Noted   Mixed hyperlipidemia 09/16/2022   Prediabetes 09/16/2022   Asymptomatic microscopic hematuria 09/16/2022   Age-related osteoporosis without current pathological fracture 06/12/2020   Aortic atherosclerosis 04/02/2020   Arthritis of shoulder 11/30/2017   Bigeminy 04/07/2016   Benign esophageal stricture 01/21/2015   Essential (primary) hypertension 01/21/2015   Hammer toe 01/21/2015   Lipoma of skin and subcutaneous tissue of trunk 01/21/2015    Past Surgical History:  Procedure Laterality Date   ABDOMINAL HYSTERECTOMY     CATARACT EXTRACTION W/PHACO Right 07/23/2020   Procedure: CATARACT EXTRACTION PHACO AND INTRAOCULAR LENS PLACEMENT (IOC) RIGHT 7.62 01:26.5 8.8%;  Surgeon: Lockie Mola, MD;  Location: [facility];  Service: Ophthalmology;  Laterality: Right;   CATARACT EXTRACTION W/PHACO Left 04/01/2021   Procedure: CATARACT EXTRACTION PHACO  AND INTRAOCULAR LENS PLACEMENT (IOC) LEFT 3.64 00:56.9;  Surgeon: Lockie Mola, MD;  Location: [facility];  Service: Ophthalmology;  Laterality: Left;   ESOPHAGOGASTRODUODENOSCOPY (EGD) WITH ESOPHAGEAL DILATION  10/2014   Dr. Servando Snare   TOTAL VAGINAL HYSTERECTOMY  1993   TUBAL LIGATION      OB History   No obstetric history on file.      Home Medications    Prior to Admission medications   Medication Sig Start Date End Date Taking? Authorizing Provider  amLODipine (NORVASC) 10 MG tablet TAKE 1 TABLET BY MOUTH DAILY 12/24/22  Yes Reubin Milan, MD  aspirin 81 MG tablet Take 81 mg by mouth daily.   Yes [provider]  calcium-vitamin D (OSCAL WITH D) 500-200 MG-UNIT tablet Take 1 tablet by mouth. 600 mg daily   Yes [provider]  diclofenac Sodium (VOLTAREN) 1 % GEL Apply 2 g topically 4 (four) times daily. 01/19/22  Yes Isa Rankin, MD  metoprolol succinate (TOPROL-XL) 25 MG 24 hr tablet TAKE 3 TABLETS(75 MG) BY MOUTH DAILY 12/24/22  Yes Reubin Milan, MD  Multiple Vitamins-Minerals (CENTRUM SILVER 50+WOMEN PO) Take 1 tablet by mouth daily.   Yes [provider]  mupirocin ointment (BACTROBAN) 2 % Apply 1 Application topically 2 (two) times daily. 01/05/23  Yes Jayelle Page, DO  olmesartan (BENICAR) 40 MG tablet TAKE 1 TABLET(40 MG) BY MOUTH DAILY 11/23/22  Yes Reubin Milan, MD  omeprazole (PRILOSEC) 20 MG  capsule TAKE 1 CAPSULE(20 MG) BY MOUTH DAILY 07/08/22  Yes Reubin Milan, MD  spironolactone (ALDACTONE) 25 MG tablet TAKE 1 TABLET(25 MG) BY MOUTH DAILY 05/10/22  Yes Reubin Milan, MD    Family History Family History  Problem Relation Age of Onset   Hypertension Mother    Other Father        "old age"   Breast cancer Daughter 101    Social History Social History   Tobacco Use   Smoking status: Never   Smokeless tobacco: Never   Tobacco comments:    smoking cessation materials not required  Vaping Use    Vaping Use: Never used  Substance Use Topics   Alcohol use: Not Currently    Alcohol/week: 2.0 standard drinks of alcohol    Types: 2 Cans of beer per week    Comment: last use 08/2019   Drug use: No     Allergies   Valsartan   Review of Systems Review of Systems: egative unless otherwise stated in HPI.      Physical Exam Triage Vital Signs ED Triage Vitals  Enc Vitals Group     BP 01/05/23 0925 (!) 141/74     Pulse Rate 01/05/23 0925 82     Resp 01/05/23 0925 16     Temp 01/05/23 0925 98.5 F (36.9 C)     Temp Source 01/05/23 0925 Oral     SpO2 01/05/23 0925 100 %     Weight 01/05/23 0925 125 lb (56.7 kg)     Height 01/05/23 0925 5\' 5"  (1.651 m)     Head Circumference --      Peak Flow --      Pain Score 01/05/23 0927 8     Pain Loc --      Pain Edu? --      Excl. in GC? --    No data found.  Updated Vital Signs BP (!) 141/74 (BP Location: Left Arm)   Pulse 82   Temp 98.5 F (36.9 C) (Oral)   Resp 16   Ht 5\' 5"  (1.651 m)   Wt 56.7 kg   SpO2 100%   BMI 20.80 kg/m   Visual Acuity Right Eye Distance:   Left Eye Distance:   Bilateral Distance:    Right Eye Near:   Left Eye Near:    Bilateral Near:     Physical Exam GEN: well appearing female in no acute distress  CVS: well perfused  RESP: speaking in full sentences without pause, no respiratory distress  MSK:  Lumbar spine: - Inspection: no gross deformity or asymmetry, swelling or ecchymosis. No skin changes  - Palpation: No TTP over the spinous processes or bilateral lumbar paraspinal muscles, no SI joint tenderness bilaterally - ROM: full active ROM of the lumbar spine in flexion and extension - Strength: 5/5 strength of lower extremity in L4-S1 nerve root distributions b/l - Neuro: sensation intact in the L4-S1 nerve root distribution b/l - Special testing: Negative straight leg raise Hip, Right: TTP noted at iliac crest. No obvious rash, deformity, erythema, ecchymosis, or edema. ROM  full in all directions; Strength 5/5  Pelvic alignment unremarkable to inspection and palpation. Non-antalgic gait without unsteadiness. Greater trochanter without tenderness to palpation. No tenderness over piriformis Neurovascularly intact. SKIN: warm, dry, no overly skin erythema, right lateral mons pubis with 2.5 cm cyst    UC Treatments / Results  Labs (all labs ordered are listed, but only abnormal results are displayed) Labs  Reviewed - No data to display  EKG   Radiology DG Hip Unilat With Pelvis 2-3 Views Left  Result Date: 01/05/2023 CLINICAL DATA:  Acute left lower back pain without known injury. EXAM: DG HIP (WITH OR WITHOUT PELVIS) 2-3V LEFT COMPARISON:  None Available. FINDINGS: There is no evidence of hip fracture or dislocation. There is no evidence of arthropathy or other focal bone abnormality. IMPRESSION: Negative. Electronically Signed   By: Lupita Raider M.D.   On: 01/05/2023 10:46     Procedures Procedures (including critical care time)  Medications Ordered in UC Medications  ketorolac (TORADOL) 30 MG/ML injection 30 mg (30 mg Intramuscular Given 01/05/23 1024)    Initial Impression / Assessment and Plan / UC Course  I have reviewed the triage vital signs and the nursing notes.  Pertinent labs & imaging results that were available during my care of the patient were reviewed by me and considered in my medical decision making (see chart for details).      Pt is a 88 y.o.  female acute onset right lower back and right hip pain after lifting buckets of water recently.  She has superior iliac crest tenderness.  Obtained right hip plain films.  Xray personally interpreted by me were unremarkable for fracture, malalignment or dislocation.  Radiologist report reviewed and showed no arthropathy.  Patient given Toradol with some relief.  Patient to gradually return to normal activities, as tolerated and continue ordinary activities within the limits permitted by  pain. Recommended Tylenol TID and Lidocaine patches PRN for multimodal pain relief. Ibuprofen if she wakes up due to pain. Patient to follow up with orthopedic provider if symptoms do not improve with conservative treatment.    Large dilated pore of winer on the lateral mons pubis removed and cleaned at pt's requests. Prescribed topical antibiotics.  Return and ED precautions given. Discussed MDM, treatment plan and plan for follow-up with patient who agrees with plan.   Final Clinical Impressions(s) / UC Diagnoses   Final diagnoses:  Left hip pain  Dilated pore of Winer     Discharge Instructions      You were given a anti-inflammatory pain medication today to help with your discomfort. Take Tylenol 1000 mg every 6-8 hours as needed for pain.   You had a removal of a skin lesion today.  Put antibiotic ointment on the area twice a day for a week.      ED Prescriptions     Medication Sig Dispense Auth. Provider   mupirocin ointment (BACTROBAN) 2 % Apply 1 Application topically 2 (two) times daily. 22 g Katha Cabal, DO      PDMP not reviewed this encounter.   Katha Cabal, DO 01/11/23 2222

## 2023-01-05 NOTE — ED Triage Notes (Signed)
Pt c/o L sided lower back pain x6 days. Denies any falls or injuries, states she has been doing a lot of heavy lifting recently.

## 2023-01-05 NOTE — Discharge Instructions (Addendum)
You were given a anti-inflammatory pain medication today to help with your discomfort. Take Tylenol 1000 mg every 6-8 hours as needed for pain.   You had a removal of a skin lesion today.  Put antibiotic ointment on the area twice a day for a week.

## 2023-01-17 ENCOUNTER — Encounter: Payer: Self-pay | Admitting: Internal Medicine

## 2023-01-17 ENCOUNTER — Ambulatory Visit (INDEPENDENT_AMBULATORY_CARE_PROVIDER_SITE_OTHER): Payer: Medicare Other | Admitting: Internal Medicine

## 2023-01-17 ENCOUNTER — Telehealth: Payer: Self-pay | Admitting: Internal Medicine

## 2023-01-17 VITALS — BP 120/72 | HR 88 | Ht 65.0 in | Wt 123.2 lb

## 2023-01-17 DIAGNOSIS — I10 Essential (primary) hypertension: Secondary | ICD-10-CM | POA: Diagnosis not present

## 2023-01-17 DIAGNOSIS — R3121 Asymptomatic microscopic hematuria: Secondary | ICD-10-CM | POA: Diagnosis not present

## 2023-01-17 LAB — POCT URINALYSIS DIPSTICK
Bilirubin, UA: NEGATIVE
Glucose, UA: NEGATIVE
Ketones, UA: NEGATIVE
Leukocytes, UA: NEGATIVE
Nitrite, UA: NEGATIVE
Protein, UA: NEGATIVE
Spec Grav, UA: 1.015 (ref 1.010–1.025)
Urobilinogen, UA: 0.2 E.U./dL
pH, UA: 5 (ref 5.0–8.0)

## 2023-01-17 NOTE — Assessment & Plan Note (Addendum)
Stable exam with well controlled BP.  Currently taking metoprolol, amlodipine, valsartan. Supposed to be on spironolactone but she thinks she never took it. Tolerating medications without concerns or side effects. Will continue to recommend low sodium diet and current regimen.

## 2023-01-17 NOTE — Telephone Encounter (Signed)
Pt is calling in because she wants to continue taking the medication spironolactone (ALDACTONE) 25 MG tablet [[id number]] . Pt says when she saw Dr. Judithann Graves she mistakenly told her she wasn't taking that medication, but she is. Pt says she had her medications confused and she would like to continue with this one.

## 2023-01-17 NOTE — Progress Notes (Signed)
Date:  01/17/2023   Name:  Kendra Lane   DOB:  [birth year]   MRN:  [id number]   Chief Complaint: Hypertension  Hypertension This is a chronic problem. The problem is controlled. Pertinent negatives include no chest pain, headaches, palpitations or shortness of breath.  Hematuria This is a new problem. The current episode started more than 1 month ago. She describes the hematuria as microscopic hematuria. Pertinent negatives include no abdominal pain. Her past medical history is significant for hypertension.    Lab Results  Component Value Date   NA 142 07/18/2022   K 3.6 07/18/2022   CO2 39 (A) 07/18/2022   GLUCOSE 108 (H) 05/10/2022   BUN 10 07/18/2022   CREATININE 0.7 07/18/2022   CALCIUM 9.8 07/18/2022   EGFR 83 07/18/2022   GFRNONAA >60 05/10/2022   Lab Results  Component Value Date   CHOL 215 (H) 09/16/2022   HDL 80 09/16/2022   LDLCALC 120 (H) 09/16/2022   TRIG 74 09/16/2022   CHOLHDL 2.7 09/16/2022   Lab Results  Component Value Date   TSH 1.90 07/18/2022   Lab Results  Component Value Date   HGBA1C 5.8 (H) 09/16/2022   Lab Results  Component Value Date   WBC 8.4 07/18/2022   HGB 12.9 07/18/2022   HCT 39 07/18/2022   MCV 90 06/16/2021   PLT 362 07/18/2022   Lab Results  Component Value Date   ALT 14 07/18/2022   AST 19 07/18/2022   ALKPHOS 38 07/18/2022   BILITOT 0.3 06/16/2021   Lab Results  Component Value Date   VD25OH 36.4 06/16/2021     Review of Systems  Constitutional:  Negative for fatigue and unexpected weight change.  HENT:  Negative for nosebleeds.   Eyes:  Negative for visual disturbance.  Respiratory:  Negative for cough, chest tightness, shortness of breath and wheezing.   Cardiovascular:  Negative for chest pain, palpitations and leg swelling.  Gastrointestinal:  Negative for abdominal pain, constipation and diarrhea.  Genitourinary:  Positive for hematuria.  Neurological:  Negative for dizziness, weakness,  light-headedness and headaches.    Patient Active Problem List   Diagnosis Date Noted   Mixed hyperlipidemia 09/16/2022   Prediabetes 09/16/2022   Asymptomatic microscopic hematuria 09/16/2022   Age-related osteoporosis without current pathological fracture 06/12/2020   Aortic atherosclerosis (HCC) 04/02/2020   Arthritis of shoulder 11/30/2017   Bigeminy 04/07/2016   Benign esophageal stricture 01/21/2015   Essential (primary) hypertension 01/21/2015   Hammer toe 01/21/2015   Lipoma of skin and subcutaneous tissue of trunk 01/21/2015    Allergies  Allergen Reactions   Valsartan Other (See Comments)    dizziness    Past Surgical History:  Procedure Laterality Date   ABDOMINAL HYSTERECTOMY     CATARACT EXTRACTION W/PHACO Right 07/23/2020   Procedure: CATARACT EXTRACTION PHACO AND INTRAOCULAR LENS PLACEMENT (IOC) RIGHT 7.62 01:26.5 8.8%;  Surgeon: Lockie Mola, MD;  Location: [facility];  Service: Ophthalmology;  Laterality: Right;   CATARACT EXTRACTION W/PHACO Left 04/01/2021   Procedure: CATARACT EXTRACTION PHACO AND INTRAOCULAR LENS PLACEMENT (IOC) LEFT 3.64 00:56.9;  Surgeon: Lockie Mola, MD;  Location: [facility];  Service: Ophthalmology;  Laterality: Left;   ESOPHAGOGASTRODUODENOSCOPY (EGD) WITH ESOPHAGEAL DILATION  10/2014   Dr. Servando Snare   TOTAL VAGINAL HYSTERECTOMY  1993   TUBAL LIGATION      Social History   Tobacco Use   Smoking status: Never   Smokeless tobacco: Never   Tobacco comments:  smoking cessation materials not required  Vaping Use   Vaping Use: Never used  Substance Use Topics   Alcohol use: Not Currently    Alcohol/week: 2.0 standard drinks of alcohol    Types: 2 Cans of beer per week    Comment: last use 08/2019   Drug use: No     Medication list has been reviewed and updated.  Current Meds  Medication Sig   amLODipine (NORVASC) 10 MG tablet TAKE 1 TABLET BY MOUTH DAILY   aspirin 81 MG tablet Take 81 mg by  mouth daily.   calcium-vitamin D (OSCAL WITH D) 500-200 MG-UNIT tablet Take 1 tablet by mouth. 600 mg daily   diclofenac Sodium (VOLTAREN) 1 % GEL Apply 2 g topically 4 (four) times daily.   metoprolol succinate (TOPROL-XL) 25 MG 24 hr tablet TAKE 3 TABLETS(75 MG) BY MOUTH DAILY   Multiple Vitamins-Minerals (CENTRUM SILVER 50+WOMEN PO) Take 1 tablet by mouth daily.   mupirocin ointment (BACTROBAN) 2 % Apply 1 Application topically 2 (two) times daily.   olmesartan (BENICAR) 40 MG tablet TAKE 1 TABLET(40 MG) BY MOUTH DAILY   omeprazole (PRILOSEC) 20 MG capsule TAKE 1 CAPSULE(20 MG) BY MOUTH DAILY   spironolactone (ALDACTONE) 25 MG tablet TAKE 1 TABLET(25 MG) BY MOUTH DAILY       01/17/2023    9:41 AM 10/14/2022    9:28 AM 09/16/2022    9:48 AM 07/02/2022   10:00 AM  GAD 7 : Generalized Anxiety Score  Nervous, Anxious, on Edge 2 2 2 2   Control/stop worrying 2 3 2 2   Worry too much - different things 3 3 2 3   Trouble relaxing 0 0 0 1  Restless 2 1 1  0  Easily annoyed or irritable 2 2 1 1   Afraid - awful might happen 1 0 0 1  Total GAD 7 Score 12 11 8 10   Anxiety Difficulty Somewhat difficult Somewhat difficult Not difficult at all Not difficult at all       01/17/2023    9:41 AM 10/14/2022    9:28 AM 09/16/2022    9:47 AM  Depression screen PHQ 2/9  Decreased Interest 2 1 2   Down, Depressed, Hopeless 2 1 1   PHQ - 2 Score 4 2 3   Altered sleeping 3 2 2   Tired, decreased energy 2 2 2   Change in appetite 0 2 0  Feeling bad or failure about yourself  1 0 0  Trouble concentrating 1 0 1  Moving slowly or fidgety/restless 1 0 0  Suicidal thoughts 0 0 0  PHQ-9 Score 12 8 8   Difficult doing work/chores Somewhat difficult Not difficult at all Not difficult at all    BP Readings from Last 3 Encounters:  01/17/23 120/72  01/05/23 (!) 141/74  10/14/22 136/76    Physical Exam Vitals and nursing note reviewed.  Constitutional:      General: She is not in acute distress.    Appearance: She  is well-developed.  HENT:     Head: Normocephalic and atraumatic.  Cardiovascular:     Rate and Rhythm: Normal rate and regular rhythm.  Pulmonary:     Effort: Pulmonary effort is normal. No respiratory distress.     Breath sounds: No wheezing or rhonchi.  Musculoskeletal:     Cervical back: Normal range of motion.  Skin:    General: Skin is warm and dry.     Findings: No rash.  Neurological:     Mental Status: She is alert  and oriented to person, place, and time.  Psychiatric:        Mood and Affect: Mood normal.        Behavior: Behavior normal.   Urine dipstick shows positive for RBC's.  Micro exam: not done.   Wt Readings from Last 3 Encounters:  01/17/23 123 lb 3.2 oz (55.9 kg)  01/05/23 125 lb (56.7 kg)  10/14/22 126 lb (57.2 kg)    BP 120/72   Pulse 88   Ht 5\' 5"  (1.651 m)   Wt 123 lb 3.2 oz (55.9 kg)   SpO2 98%   BMI 20.50 kg/m   Assessment and Plan:  Problem List Items Addressed This Visit       Cardiovascular and Mediastinum   Essential (primary) hypertension - Primary (Chronic)    Stable exam with well controlled BP.  Currently taking metoprolol, amlodipine, valsartan. Supposed to be on spironolactone but she thinks she never took it. Tolerating medications without concerns or side effects. Will continue to recommend low sodium diet and current regimen.         Genitourinary   Asymptomatic microscopic hematuria    Persistent microscopic hematuria No symptoms to suggest an etiology Will refer to Urology      Relevant Orders   POCT Urinalysis Dipstick (Completed)   Ambulatory referral to Urology    No follow-ups on file.   Partially dictated using Dragon software, any errors are not intentional.  Reubin Milan, MD [facility] [place], [place]

## 2023-01-17 NOTE — Assessment & Plan Note (Signed)
Persistent microscopic hematuria No symptoms to suggest an etiology Will refer to Urology

## 2023-01-26 ENCOUNTER — Other Ambulatory Visit
Admission: RE | Admit: 2023-01-26 | Discharge: 2023-01-26 | Disposition: A | Discharge status: Home / Self Care | Payer: Medicare Other | Attending: Urology | Admitting: Urology

## 2023-01-26 ENCOUNTER — Other Ambulatory Visit: Payer: Self-pay

## 2023-01-26 ENCOUNTER — Encounter: Payer: Self-pay | Admitting: Urology

## 2023-01-26 ENCOUNTER — Ambulatory Visit (INDEPENDENT_AMBULATORY_CARE_PROVIDER_SITE_OTHER): Payer: Medicare Other | Admitting: Urology

## 2023-01-26 VITALS — BP 175/81 | HR 101 | Ht 65.0 in | Wt 126.0 lb

## 2023-01-26 DIAGNOSIS — R3121 Asymptomatic microscopic hematuria: Secondary | ICD-10-CM | POA: Insufficient documentation

## 2023-01-26 DIAGNOSIS — R3129 Other microscopic hematuria: Secondary | ICD-10-CM

## 2023-01-26 LAB — URINALYSIS, COMPLETE (UACMP) WITH MICROSCOPIC
Bilirubin Urine: NEGATIVE
Glucose, UA: NEGATIVE mg/dL
Ketones, ur: NEGATIVE mg/dL
Leukocytes,Ua: NEGATIVE
Nitrite: NEGATIVE
Protein, ur: NEGATIVE mg/dL
Specific Gravity, Urine: 1.015 (ref 1.005–1.030)
pH: 6 (ref 5.0–8.0)

## 2023-01-26 NOTE — Progress Notes (Signed)
   01/26/23 12:19 PM   Kendra Lane 02/24/1935 [id number]  CC: Asymptomatic microscopic hematuria  HPI: Very healthy 88 year old female here with her daughter today who was referred for asymptomatic microscopic hematuria with 3 RBCs seen on recent urine sample from February 2024.  Urinalysis today with 6-10 RBC but otherwise benign.  She denies any gross hematuria, dysuria, or other urinary symptoms.  PMH: Past Medical History:  Diagnosis Date   Dysrhythmia    hx of, ok now   GERD (gastroesophageal reflux disease)    Headache    twice a day sometimes   Hypertension    Neck pain    Shoulder pain, left     Surgical History: Past Surgical History:  Procedure Laterality Date   ABDOMINAL HYSTERECTOMY     CATARACT EXTRACTION W/PHACO Right 07/23/2020   Procedure: CATARACT EXTRACTION PHACO AND INTRAOCULAR LENS PLACEMENT (IOC) RIGHT 7.62 01:26.5 8.8%;  Surgeon: Lockie Mola, MD;  Location: [facility];  Service: Ophthalmology;  Laterality: Right;   CATARACT EXTRACTION W/PHACO Left 04/01/2021   Procedure: CATARACT EXTRACTION PHACO AND INTRAOCULAR LENS PLACEMENT (IOC) LEFT 3.64 00:56.9;  Surgeon: Lockie Mola, MD;  Location: [facility];  Service: Ophthalmology;  Laterality: Left;   ESOPHAGOGASTRODUODENOSCOPY (EGD) WITH ESOPHAGEAL DILATION  10/2014   Dr. Servando Snare   TOTAL VAGINAL HYSTERECTOMY  1993   TUBAL LIGATION        Family History: Family History  Problem Relation Age of Onset   Hypertension Mother    Other Father        "old age"   Breast cancer Daughter 67    Social History:  reports that she has never smoked. She has never been exposed to tobacco smoke. She has never used smokeless tobacco. She reports that she does not currently use alcohol after a past usage of about 2.0 standard drinks of alcohol per week. She reports that she does not use drugs.  Physical Exam: BP (!) 175/81   Pulse (!) 101   Ht 5\' 5"  (1.651 m)   Wt 126 lb (57.2  kg)   BMI 20.97 kg/m    Constitutional:  Alert and oriented, No acute distress. Cardiovascular: No clubbing, cyanosis, or edema. Respiratory: Normal respiratory effort, no increased work of breathing. GI: Abdomen is soft, nontender, nondistended, no abdominal masses   Laboratory Data: Reviewed, see HPI  Pertinent Imaging: None to review  Assessment & Plan:   Healthy 88 year old female with 2 episodes of asymptomatic microscopic hematuria.  We discussed common possible etiologies of microscopic hematuria including idiopathic, urolithiasis, medical renal disease, and malignancy. We discussed the new asymptomatic microscopic hematuria guidelines and risk categories of low, intermediate, and high risk that are based on age, risk factors like smoking, and degree of microscopic hematuria. We discussed work-up can range from repeat urinalysis, renal ultrasound and cystoscopy, to CT urogram and cystoscopy.  They fall into the high risk category, and I recommended proceeding with CT and cystoscopy.    Legrand Rams, MD 01/26/2023  [facility] [address] [place], [place] [address] [phone]

## 2023-01-26 NOTE — Patient Instructions (Signed)

## 2023-01-27 LAB — URINE CULTURE: Culture: NO GROWTH

## 2023-01-28 ENCOUNTER — Other Ambulatory Visit: Payer: Self-pay | Admitting: Internal Medicine

## 2023-01-28 DIAGNOSIS — I10 Essential (primary) hypertension: Secondary | ICD-10-CM

## 2023-01-28 NOTE — Telephone Encounter (Signed)
Unable to refill per protocol, Rx request is too soon. Last refill 11/23/22 for 90 days.  Requested Prescriptions  Pending Prescriptions Disp Refills   olmesartan (BENICAR) 40 MG tablet [Pharmacy Med Name: OLMESARTAN MEDOXOMIL 40MG  TABLETS] 90 tablet 0    Sig: TAKE 1 TABLET(40 MG) BY MOUTH DAILY     Cardiovascular:  Angiotensin Receptor Blockers Failed - 01/28/2023  9:10 AM      Failed - Cr in normal range and within 180 days    Creatinine  Date Value Ref Range Status  07/18/2022 0.7 0.5 - 1.1 Final  01/22/2014 0.60 0.60 - 1.30 mg/dL Final   Creatinine, Ser  Date Value Ref Range Status  05/10/2022 0.66 0.44 - 1.00 mg/dL Final         Failed - K in normal range and within 180 days    Potassium  Date Value Ref Range Status  07/18/2022 3.6 3.5 - 5.1 mEq/L Final  01/22/2014 3.8 3.5 - 5.1 mmol/L Final         Failed - Last BP in normal range    BP Readings from Last 1 Encounters:  01/26/23 (!) 175/81         Passed - Patient is not pregnant      Passed - Valid encounter within last 6 months    Recent Outpatient Visits           1 week ago Essential (primary) hypertension   [facility], Nyoka Cowden, MD   3 months ago Benign esophageal stricture   [facility], Nyoka Cowden, MD   4 months ago Annual physical exam   [facility] at [facility], Nyoka Cowden, MD   7 months ago Essential (primary) hypertension   [facility] at [facility], Nyoka Cowden, MD   8 months ago Essential (primary) hypertension   [facility] at [facility], Nyoka Cowden, MD

## 2023-02-01 ENCOUNTER — Other Ambulatory Visit: Payer: Self-pay | Admitting: Internal Medicine

## 2023-02-01 DIAGNOSIS — I10 Essential (primary) hypertension: Secondary | ICD-10-CM

## 2023-02-02 ENCOUNTER — Ambulatory Visit
Admission: RE | Admit: 2023-02-02 | Discharge: 2023-02-02 | Disposition: A | Discharge status: Home / Self Care | Payer: Medicare Other | Source: Ambulatory Visit | Attending: Urology | Admitting: Urology

## 2023-02-02 DIAGNOSIS — R3129 Other microscopic hematuria: Secondary | ICD-10-CM | POA: Insufficient documentation

## 2023-02-02 DIAGNOSIS — K5730 Diverticulosis of large intestine without perforation or abscess without bleeding: Secondary | ICD-10-CM | POA: Diagnosis not present

## 2023-02-02 DIAGNOSIS — N281 Cyst of kidney, acquired: Secondary | ICD-10-CM | POA: Diagnosis not present

## 2023-02-02 DIAGNOSIS — N200 Calculus of kidney: Secondary | ICD-10-CM | POA: Diagnosis not present

## 2023-02-02 LAB — POCT I-STAT CREATININE: Creatinine, Ser: 0.9 mg/dL (ref 0.44–1.00)

## 2023-02-02 MED ORDER — IOHEXOL 300 MG/ML  SOLN
150.0000 mL | Freq: Once | INTRAMUSCULAR | Status: AC | PRN
  Administered 2023-02-02: 125 mL via INTRAVENOUS

## 2023-02-09 ENCOUNTER — Ambulatory Visit (INDEPENDENT_AMBULATORY_CARE_PROVIDER_SITE_OTHER): Payer: Medicare Other | Admitting: Urology

## 2023-02-09 ENCOUNTER — Encounter: Payer: Self-pay | Admitting: Urology

## 2023-02-09 VITALS — BP 161/79 | HR 96 | Ht 65.0 in | Wt 126.0 lb

## 2023-02-09 DIAGNOSIS — R3129 Other microscopic hematuria: Secondary | ICD-10-CM

## 2023-02-09 DIAGNOSIS — N200 Calculus of kidney: Secondary | ICD-10-CM

## 2023-02-09 NOTE — Progress Notes (Signed)
Cystoscopy Procedure Note:  Indication: Asymptomatic microscopic hematuria  After informed consent and discussion of the procedure and its risks, Kendra Lane was positioned and prepped in the standard fashion. Cystoscopy was performed with a flexible cystoscope. The urethra, bladder neck and entire bladder was visualized in a standard fashion. The ureteral orifices were visualized in their normal location and orientation.  Bladder mucosa grossly normal throughout  Imaging: CT urogram with nonobstructive left renal stones, no other urologic abnormalities  Findings: Normal cystoscopy  Assessment and Plan: Normal cystoscopy, she opts for surveillance for her left renal stones, return precautions discussed extensively, RTC 1 year KUB prior  Legrand Rams, MD 02/09/2023

## 2023-03-21 ENCOUNTER — Ambulatory Visit
Admission: EM | Admit: 2023-03-21 | Discharge: 2023-03-21 | Disposition: A | Discharge status: Home / Self Care | Payer: Medicare Other

## 2023-03-21 DIAGNOSIS — B351 Tinea unguium: Secondary | ICD-10-CM | POA: Diagnosis not present

## 2023-03-21 DIAGNOSIS — M2041 Other hammer toe(s) (acquired), right foot: Secondary | ICD-10-CM | POA: Diagnosis not present

## 2023-03-21 NOTE — ED Provider Notes (Signed)
[facility]    CSN: [id number] Arrival date & time: 03/21/23  0801      History   Chief Complaint Chief Complaint  Patient presents with   Toe Pain    RT foot toes    HPI Kendra Lane is a 88 y.o. female.   HPI  88 year old female with past medical history significant for hypertension, GERD, and dysrhythmia presents for evaluation of pain in the toes of her right foot.  This is been going on for over a year.  She was evaluated by Dr. Ether Griffins over [facility] in August of last year for the same condition.  She was found to have toenail fungus on all 5 of her toes.  The toenail fungus was debrided and she was offered topical and systemic therapy which she declined.  She was also found to have hammertoe contractions of all lesser toes and a hallux valgus contracture of her great toe.  She is complaining of the same pain that she was experiencing a year ago with pain between her great toe and second toe.  Also pain between the second toe and third toe.  Pain mainly occurs with walking.  Past Medical History:  Diagnosis Date   Dysrhythmia    hx of, ok now   GERD (gastroesophageal reflux disease)    Headache    twice a day sometimes   Hypertension    Neck pain    Shoulder pain, left     Patient Active Problem List   Diagnosis Date Noted   Mixed hyperlipidemia 09/16/2022   Prediabetes 09/16/2022   Asymptomatic microscopic hematuria 09/16/2022   Age-related osteoporosis without current pathological fracture 06/12/2020   Aortic atherosclerosis (HCC) 04/02/2020   Arthritis of shoulder 11/30/2017   Bigeminy 04/07/2016   Benign esophageal stricture 01/21/2015   Essential (primary) hypertension 01/21/2015   Hammer toe 01/21/2015   Lipoma of skin and subcutaneous tissue of trunk 01/21/2015    Past Surgical History:  Procedure Laterality Date   ABDOMINAL HYSTERECTOMY     CATARACT EXTRACTION W/PHACO Right 07/23/2020   Procedure: CATARACT EXTRACTION  PHACO AND INTRAOCULAR LENS PLACEMENT (IOC) RIGHT 7.62 01:26.5 8.8%;  Surgeon: Lockie Mola, MD;  Location: [facility];  Service: Ophthalmology;  Laterality: Right;   CATARACT EXTRACTION W/PHACO Left 04/01/2021   Procedure: CATARACT EXTRACTION PHACO AND INTRAOCULAR LENS PLACEMENT (IOC) LEFT 3.64 00:56.9;  Surgeon: Lockie Mola, MD;  Location: [facility];  Service: Ophthalmology;  Laterality: Left;   ESOPHAGOGASTRODUODENOSCOPY (EGD) WITH ESOPHAGEAL DILATION  10/2014   Dr. Servando Snare   TOTAL VAGINAL HYSTERECTOMY  1993   TUBAL LIGATION      OB History   No obstetric history on file.      Home Medications    Prior to Admission medications   Medication Sig Start Date End Date Taking? Authorizing Provider  amLODipine (NORVASC) 10 MG tablet TAKE 1 TABLET BY MOUTH DAILY 12/24/22   Reubin Milan, MD  aspirin 81 MG tablet Take 81 mg by mouth daily.    [provider]  calcium-vitamin D (OSCAL WITH D) 500-200 MG-UNIT tablet Take 1 tablet by mouth. 600 mg daily    [provider]  diclofenac Sodium (VOLTAREN) 1 % GEL Apply 2 g topically 4 (four) times daily. 01/19/22   Isa Rankin, MD  metoprolol succinate (TOPROL-XL) 25 MG 24 hr tablet TAKE 3 TABLETS(75 MG) BY MOUTH DAILY 12/24/22   Reubin Milan, MD  Multiple Vitamins-Minerals (CENTRUM SILVER 50+WOMEN PO) Take  1 tablet by mouth daily.    [provider]  mupirocin ointment (BACTROBAN) 2 % Apply 1 Application topically 2 (two) times daily. 01/05/23   Katha Cabal, DO  olmesartan (BENICAR) 40 MG tablet TAKE 1 TABLET(40 MG) BY MOUTH DAILY 02/01/23   Reubin Milan, MD  omeprazole (PRILOSEC) 20 MG capsule TAKE 1 CAPSULE(20 MG) BY MOUTH DAILY 07/08/22   Reubin Milan, MD  spironolactone (ALDACTONE) 25 MG tablet TAKE 1 TABLET(25 MG) BY MOUTH DAILY 05/10/22   Reubin Milan, MD    Family History Family History  Problem Relation Age of Onset   Hypertension Mother    Other  Father        "old age"   Breast cancer Daughter 52    Social History Social History   Tobacco Use   Smoking status: Never    Passive exposure: Never   Smokeless tobacco: Never   Tobacco comments:    smoking cessation materials not required  Vaping Use   Vaping Use: Never used  Substance Use Topics   Alcohol use: Not Currently    Alcohol/week: 2.0 standard drinks of alcohol    Types: 2 Cans of beer per week    Comment: last use 08/2019   Drug use: No     Allergies   Valsartan   Review of Systems Review of Systems  Musculoskeletal:  Positive for arthralgias. Negative for joint swelling.  Skin:  Negative for color change and wound.     Physical Exam Triage Vital Signs ED Triage Vitals  Enc Vitals Group     BP      Pulse      Resp      Temp      Temp src      SpO2      Weight      Height      Head Circumference      Peak Flow      Pain Score      Pain Loc      Pain Edu?      Excl. in GC?    No data found.  Updated Vital Signs BP (!) 163/80 (BP Location: Left Arm)   Pulse 86   Temp 98.7 F (37.1 C) (Oral)   SpO2 95%   Visual Acuity Right Eye Distance:   Left Eye Distance:   Bilateral Distance:    Right Eye Near:   Left Eye Near:    Bilateral Near:     Physical Exam Vitals and nursing note reviewed.  Constitutional:      Appearance: Normal appearance. She is not ill-appearing.  HENT:     Head: Normocephalic and atraumatic.  Musculoskeletal:        General: Tenderness and deformity present. No signs of injury.  Skin:    General: Skin is warm and dry.     Capillary Refill: Capillary refill takes less than 2 seconds.     Findings: No erythema.  Neurological:     General: No focal deficit present.     Mental Status: She is alert and oriented to person, place, and time.      UC Treatments / Results  Labs (all labs ordered are listed, but only abnormal results are displayed) Labs Reviewed - No data to  display  EKG   Radiology No results found.  Procedures Procedures (including critical care time)  Medications Ordered in UC Medications - No data to display  Initial Impression / Assessment and Plan /  UC Course  I have reviewed the triage vital signs and the nursing notes.  Pertinent labs & imaging results that were available during my care of the patient were reviewed by me and considered in my medical decision making (see chart for details).   Patient is a nontoxic-appearing 88 year old female presenting for evaluation of pain in the toes of her right foot as outlined HPI above.    As you can see in image above, patient has onychomycosis of all of her toenails as well as hammertoe deformities of all lesser toes and a hallux valgus deformity of the great toe.  This is forcing her first 3 toes into each other.  They are also calluses forming on the tips of her lesser toes secondary to the hammertoe deformities.  I have advised the patient that there is nothing that I can do for her in the urgent care and that she needs to follow back up with Dr. Ether Griffins whom she saw a year ago.  I have given her his contact information.  I did recommend in the meantime that she purchase some toe separator questions to help prevent her toes from rubbing together see if this aids in her comfort.  She can also apply topical Voltaren gel to her foot and toes 4 times a day as needed for pain.   Final Clinical Impressions(s) / UC Diagnoses   Final diagnoses:  Hammer toe of right foot  Onychomycosis     Discharge Instructions      You may apply topical Voltaren gel to your foot and toes 4 times a day as needed for pain.  Wear a toe separator cushion between your toes to prevent them rubbing and to decrease pain.  Call Dr. Irene Limbo office for an appointment to discuss treatment options for your pain and also your toenail fungus.      ED Prescriptions   None    PDMP not reviewed this  encounter.   Becky Augusta, NP 03/21/23 [phone]

## 2023-03-21 NOTE — ED Triage Notes (Addendum)
Pt states she is having pain on all toes on RT foot x over a year. Pt states mainly in between toes and more pain when walking.

## 2023-03-21 NOTE — Discharge Instructions (Addendum)
You may apply topical Voltaren gel to your foot and toes 4 times a day as needed for pain.  Wear a toe separator cushion between your toes to prevent them rubbing and to decrease pain.  Call Dr. Irene Limbo office for an appointment to discuss treatment options for your pain and also your toenail fungus.

## 2023-03-29 ENCOUNTER — Ambulatory Visit: Payer: Self-pay

## 2023-03-29 NOTE — Telephone Encounter (Signed)
Please schedule pt in a same day spot.  KP

## 2023-03-29 NOTE — Telephone Encounter (Signed)
Message from Phill Myron sent at 03/29/2023 11:25 AM EDT  Summary: tired legs   Tired legs (no pain) for about two weeks off and on. Patient would like an  OTC recommendation. Does not want to go into the office.         Chief Complaint: weakness to both thighs Symptoms: only occurs in the morning hours then goes away in the afternoon  Frequency: 2 weeks Pertinent Negatives: Patient denies chest pain, fever, cough, SOB, vomiting, diarrhea, bleeding, other areas of pain Disposition: [] ED /[] Urgent Care (no appt availability in office) / [] Appointment(In office/virtual)/ []  [facility]/ [] Home Care/ [] Refused Recommended Disposition /[] [facility]/ [x]  Follow-up with PCP Additional Notes: no appt's available within 24 hours with PCP. Pt reluctant to make appt but finally agreed. Advised pt will forward to PCP nurse for appt.   Reason for Disposition  [1] MODERATE weakness (i.e., interferes with work, school, normal activities) AND [2] persists > 3 days  Answer Assessment - Initial Assessment Questions 1. SYMPTOM: "What is the main symptom you are concerned about?" (e.g., weakness, numbness)     Weakness thighs - in the afternoon sx go away  2. ONSET: "When did this start?" (minutes, hours, days; while sleeping)     2 weeks  3. LAST NORMAL: "When was the last time you (the patient) were normal (no symptoms)?"     3 weeks  4. PATTERN "Does this come and go, or has it been constant since it started?"  "Is it present now?"     When movement-yes 5. CARDIAC SYMPTOMS: "Have you had any of the following symptoms: chest pain, difficulty breathing, palpitations?"     Occasional chest pain comes and goes left breast  6. NEUROLOGIC SYMPTOMS: "Have you had any of the following symptoms: headache, dizziness, vision loss, double vision, changes in speech, unsteady on your feet?"     Unsteady with symptoms  7. OTHER SYMPTOMS: "Do you have any other symptoms?"      no  Answer Assessment - Initial Assessment Questions 1. DESCRIPTION: "Describe how you are feeling."     Weakness to both thighs during the morning hours then back to normal in the afternoon 2. SEVERITY: "How bad is it?"  "Can you stand and walk?"   - MILD (0-3): Feels weak or tired, but does not interfere with work, school or normal activities.   - MODERATE (4-7): Able to stand and walk; weakness interferes with work, school, or normal activities.   - SEVERE (8-10): Unable to stand or walk; unable to do usual activities.     Moderate when the weakness occurs. Pt has to sit down until subsides 3. ONSET: "When did these symptoms begin?" (e.g., hours, days, weeks, months)     2 weeks  4. CAUSE: "What do you think is causing the weakness or fatigue?" (e.g., not drinking enough fluids, medical problem, trouble sleeping)     unsure 5. OTHER SYMPTOMS: "Do you have any other symptoms?" (e.g., chest pain, fever, cough, SOB, vomiting, diarrhea, bleeding, other areas of pain)     no  Protocols used: Neurologic Deficit-A-AH, Weakness (Generalized) and Fatigue-A-AH

## 2023-03-30 ENCOUNTER — Encounter: Payer: Self-pay | Admitting: Internal Medicine

## 2023-03-30 ENCOUNTER — Ambulatory Visit (INDEPENDENT_AMBULATORY_CARE_PROVIDER_SITE_OTHER): Payer: Medicare Other | Admitting: Internal Medicine

## 2023-03-30 VITALS — BP 124/66 | HR 76 | Ht 65.0 in | Wt 120.0 lb

## 2023-03-30 DIAGNOSIS — M79674 Pain in right toe(s): Secondary | ICD-10-CM | POA: Diagnosis not present

## 2023-03-30 DIAGNOSIS — R29898 Other symptoms and signs involving the musculoskeletal system: Secondary | ICD-10-CM | POA: Diagnosis not present

## 2023-03-30 DIAGNOSIS — M79675 Pain in left toe(s): Secondary | ICD-10-CM | POA: Diagnosis not present

## 2023-03-30 DIAGNOSIS — M2041 Other hammer toe(s) (acquired), right foot: Secondary | ICD-10-CM | POA: Diagnosis not present

## 2023-03-30 DIAGNOSIS — I10 Essential (primary) hypertension: Secondary | ICD-10-CM

## 2023-03-30 DIAGNOSIS — B351 Tinea unguium: Secondary | ICD-10-CM | POA: Diagnosis not present

## 2023-03-30 DIAGNOSIS — M2011 Hallux valgus (acquired), right foot: Secondary | ICD-10-CM | POA: Diagnosis not present

## 2023-03-30 NOTE — Assessment & Plan Note (Signed)
Normal exam with stable BP on multiple medications . Leg symptoms may be borderline low BP or HR from metoprolol - will move dose to PM. No change in regimen; continue low sodium diet.

## 2023-03-30 NOTE — Patient Instructions (Signed)
Move the metoprolol dose to bedtime.

## 2023-03-30 NOTE — Progress Notes (Signed)
Date:  03/30/2023   Name:  Kendra Lane   DOB:  24-Aug-1935   MRN:  [id number]   Chief Complaint: Tired Legs (Weakness in both legs. In the morning her legs feel weak and gets better through out the day. )  HPI Leg heaviness - she feels like her legs are tired every AM lasting until the mid afternoon.  There is no pain or cramping.  No chest pain or heaviness, shortness of breath.  She denies joint pains.  Recent hip xray showed no arthritis.  She takes all of her antihypertensives in the AM.   Lab Results  Component Value Date   NA 142 07/18/2022   K 3.6 07/18/2022   CO2 39 (A) 07/18/2022   GLUCOSE 108 (H) 05/10/2022   BUN 10 07/18/2022   CREATININE 0.90 02/02/2023   CALCIUM 9.8 07/18/2022   EGFR 83 07/18/2022   GFRNONAA >60 05/10/2022   Lab Results  Component Value Date   CHOL 215 (H) 09/16/2022   HDL 80 09/16/2022   LDLCALC 120 (H) 09/16/2022   TRIG 74 09/16/2022   CHOLHDL 2.7 09/16/2022   Lab Results  Component Value Date   TSH 1.90 07/18/2022   Lab Results  Component Value Date   HGBA1C 5.8 (H) 09/16/2022   Lab Results  Component Value Date   WBC 8.4 07/18/2022   HGB 12.9 07/18/2022   HCT 39 07/18/2022   MCV 90 06/16/2021   PLT 362 07/18/2022   Lab Results  Component Value Date   ALT 14 07/18/2022   AST 19 07/18/2022   ALKPHOS 38 07/18/2022   BILITOT 0.3 06/16/2021   Lab Results  Component Value Date   VD25OH 36.4 06/16/2021     Review of Systems  Constitutional:  Negative for chills, fatigue and fever.  Respiratory:  Negative for chest tightness and shortness of breath.   Cardiovascular:  Negative for chest pain, palpitations and leg swelling.  Musculoskeletal:  Negative for arthralgias, gait problem, joint swelling and myalgias.  Neurological:  Negative for dizziness and headaches.  Psychiatric/Behavioral:  Negative for dysphoric mood and sleep disturbance. The patient is not nervous/anxious.     Patient Active Problem List    Diagnosis Date Noted   Mixed hyperlipidemia 09/16/2022   Prediabetes 09/16/2022   Asymptomatic microscopic hematuria 09/16/2022   Age-related osteoporosis without current pathological fracture 06/12/2020   Aortic atherosclerosis (HCC) 04/02/2020   Arthritis of shoulder 11/30/2017   Bigeminy 04/07/2016   Benign esophageal stricture 01/21/2015   Essential (primary) hypertension 01/21/2015   Hammer toe 01/21/2015   Lipoma of skin and subcutaneous tissue of trunk 01/21/2015    Allergies  Allergen Reactions   Valsartan Other (See Comments)    dizziness    Past Surgical History:  Procedure Laterality Date   ABDOMINAL HYSTERECTOMY     CATARACT EXTRACTION W/PHACO Right 07/23/2020   Procedure: CATARACT EXTRACTION PHACO AND INTRAOCULAR LENS PLACEMENT (IOC) RIGHT 7.62 01:26.5 8.8%;  Surgeon: Lockie Mola, MD;  Location: [facility];  Service: Ophthalmology;  Laterality: Right;   CATARACT EXTRACTION W/PHACO Left 04/01/2021   Procedure: CATARACT EXTRACTION PHACO AND INTRAOCULAR LENS PLACEMENT (IOC) LEFT 3.64 00:56.9;  Surgeon: Lockie Mola, MD;  Location: [facility];  Service: Ophthalmology;  Laterality: Left;   ESOPHAGOGASTRODUODENOSCOPY (EGD) WITH ESOPHAGEAL DILATION  10/2014   Dr. Servando Snare   TOTAL VAGINAL HYSTERECTOMY  1993   TUBAL LIGATION      Social History   Tobacco Use   Smoking status: Never  Passive exposure: Never   Smokeless tobacco: Never   Tobacco comments:    smoking cessation materials not required  Vaping Use   Vaping status: Never Used  Substance Use Topics   Alcohol use: Not Currently    Alcohol/week: 2.0 standard drinks of alcohol    Types: 2 Cans of beer per week    Comment: last use 08/2019   Drug use: No     Medication list has been reviewed and updated.  Current Meds  Medication Sig   amLODipine (NORVASC) 10 MG tablet TAKE 1 TABLET BY MOUTH DAILY   aspirin 81 MG tablet Take 81 mg by mouth daily.   calcium-vitamin D  (OSCAL WITH D) 500-200 MG-UNIT tablet Take 1 tablet by mouth. 600 mg daily   diclofenac Sodium (VOLTAREN) 1 % GEL Apply 2 g topically 4 (four) times daily.   metoprolol succinate (TOPROL-XL) 25 MG 24 hr tablet TAKE 3 TABLETS(75 MG) BY MOUTH DAILY   Multiple Vitamins-Minerals (CENTRUM SILVER 50+WOMEN PO) Take 1 tablet by mouth daily.   mupirocin ointment (BACTROBAN) 2 % Apply 1 Application topically 2 (two) times daily.   olmesartan (BENICAR) 40 MG tablet TAKE 1 TABLET(40 MG) BY MOUTH DAILY   omeprazole (PRILOSEC) 20 MG capsule TAKE 1 CAPSULE(20 MG) BY MOUTH DAILY   spironolactone (ALDACTONE) 25 MG tablet TAKE 1 TABLET(25 MG) BY MOUTH DAILY       03/30/2023    9:55 AM 01/17/2023    9:41 AM 10/14/2022    9:28 AM 09/16/2022    9:48 AM  GAD 7 : Generalized Anxiety Score  Nervous, Anxious, on Edge 0 2 2 2   Control/stop worrying 0 2 3 2   Worry too much - different things 0 3 3 2   Trouble relaxing 0 0 0 0  Restless 0 2 1 1   Easily annoyed or irritable 0 2 2 1   Afraid - awful might happen 0 1 0 0  Total GAD 7 Score 0 12 11 8   Anxiety Difficulty Not difficult at all Somewhat difficult Somewhat difficult Not difficult at all       03/30/2023    9:55 AM 01/17/2023    9:41 AM 10/14/2022    9:28 AM  Depression screen PHQ 2/9  Decreased Interest 0 2 1  Down, Depressed, Hopeless 0 2 1  PHQ - 2 Score 0 4 2  Altered sleeping 0 3 2  Tired, decreased energy 0 2 2  Change in appetite 0 0 2  Feeling bad or failure about yourself  0 1 0  Trouble concentrating 0 1 0  Moving slowly or fidgety/restless 0 1 0  Suicidal thoughts 0 0 0  PHQ-9 Score 0 12 8  Difficult doing work/chores Not difficult at all Somewhat difficult Not difficult at all    BP Readings from Last 3 Encounters:  03/30/23 124/66  03/21/23 (!) 163/80  02/09/23 (!) 161/79    Physical Exam Vitals and nursing note reviewed.  Constitutional:      General: She is not in acute distress.    Appearance: Normal appearance. She is  well-developed.  HENT:     Head: Normocephalic and atraumatic.  Neck:     Vascular: No carotid bruit.  Cardiovascular:     Rate and Rhythm: Normal rate and regular rhythm.     Pulses:          Dorsalis pedis pulses are 1+ on the right side and 1+ on the left side.       Posterior  tibial pulses are 1+ on the right side and 1+ on the left side.  Pulmonary:     Effort: Pulmonary effort is normal. No respiratory distress.     Breath sounds: No wheezing or rhonchi.  Musculoskeletal:     Cervical back: Normal range of motion.     Right hip: Normal.     Left hip: Normal.     Right knee: Normal.     Left knee: Normal.     Right lower leg: No edema.     Left lower leg: No edema.  Lymphadenopathy:     Cervical: No cervical adenopathy.  Skin:    General: Skin is warm and dry.     Findings: No rash.  Neurological:     Mental Status: She is alert and oriented to person, place, and time.     Sensory: Sensation is intact.     Motor: No weakness.     Gait: Gait is intact.  Psychiatric:        Mood and Affect: Mood normal.        Behavior: Behavior normal.     Wt Readings from Last 3 Encounters:  03/30/23 120 lb (54.4 kg)  02/09/23 126 lb (57.2 kg)  01/26/23 126 lb (57.2 kg)    BP 124/66   Pulse 76   Ht 5\' 5"  (1.651 m)   Wt 120 lb (54.4 kg)   SpO2 98%   BMI 19.97 kg/m   Assessment and Plan:  Problem List Items Addressed This Visit     Essential (primary) hypertension (Chronic)    Normal exam with stable BP on multiple medications . Leg symptoms may be borderline low BP or HR from metoprolol - will move dose to PM. No change in regimen; continue low sodium diet.       Other Visit Diagnoses     Leg heaviness    -  Primary   suspect this is due to taking all of her meds at one time. will move the metoprolol to evening/bedtime and monitor for improvement follow up in 2 months & PRN       Return in about 2 months (around 05/31/2023) for HTN, leg heaviness.     Reubin Milan, MD [facility] [place]

## 2023-05-11 DIAGNOSIS — H9319 Tinnitus, unspecified ear: Secondary | ICD-10-CM | POA: Diagnosis not present

## 2023-05-11 DIAGNOSIS — R262 Difficulty in walking, not elsewhere classified: Secondary | ICD-10-CM | POA: Diagnosis not present

## 2023-05-11 DIAGNOSIS — R9082 White matter disease, unspecified: Secondary | ICD-10-CM | POA: Diagnosis not present

## 2023-05-11 DIAGNOSIS — Z79899 Other long term (current) drug therapy: Secondary | ICD-10-CM | POA: Diagnosis not present

## 2023-05-11 DIAGNOSIS — R002 Palpitations: Secondary | ICD-10-CM | POA: Diagnosis not present

## 2023-05-11 DIAGNOSIS — R29898 Other symptoms and signs involving the musculoskeletal system: Secondary | ICD-10-CM | POA: Diagnosis not present

## 2023-05-11 DIAGNOSIS — R42 Dizziness and giddiness: Secondary | ICD-10-CM | POA: Diagnosis not present

## 2023-05-11 DIAGNOSIS — H6123 Impacted cerumen, bilateral: Secondary | ICD-10-CM | POA: Diagnosis not present

## 2023-05-11 DIAGNOSIS — Z7982 Long term (current) use of aspirin: Secondary | ICD-10-CM | POA: Diagnosis not present

## 2023-05-11 DIAGNOSIS — R54 Age-related physical debility: Secondary | ICD-10-CM | POA: Diagnosis not present

## 2023-05-11 DIAGNOSIS — I6782 Cerebral ischemia: Secondary | ICD-10-CM | POA: Diagnosis not present

## 2023-05-11 DIAGNOSIS — I10 Essential (primary) hypertension: Secondary | ICD-10-CM | POA: Diagnosis not present

## 2023-05-11 DIAGNOSIS — R269 Unspecified abnormalities of gait and mobility: Secondary | ICD-10-CM | POA: Diagnosis not present

## 2023-05-11 DIAGNOSIS — Z1152 Encounter for screening for COVID-19: Secondary | ICD-10-CM | POA: Diagnosis not present

## 2023-05-12 ENCOUNTER — Other Ambulatory Visit: Payer: Self-pay | Admitting: Internal Medicine

## 2023-05-12 DIAGNOSIS — I440 Atrioventricular block, first degree: Secondary | ICD-10-CM | POA: Diagnosis not present

## 2023-05-12 DIAGNOSIS — I10 Essential (primary) hypertension: Secondary | ICD-10-CM

## 2023-05-13 NOTE — Telephone Encounter (Signed)
Requested Prescriptions  Pending Prescriptions Disp Refills   olmesartan (BENICAR) 40 MG tablet [Pharmacy Med Name: OLMESARTAN MEDOXOMIL 40MG  TABLETS] 90 tablet 0    Sig: TAKE 1 TABLET(40 MG) BY MOUTH DAILY     Cardiovascular:  Angiotensin Receptor Blockers Failed - 05/12/2023  4:43 PM      Failed - K in normal range and within 180 days    Potassium  Date Value Ref Range Status  07/18/2022 3.6 3.5 - 5.1 mEq/L Final  01/22/2014 3.8 3.5 - 5.1 mmol/L Final         Passed - Cr in normal range and within 180 days    Creatinine  Date Value Ref Range Status  01/22/2014 0.60 0.60 - 1.30 mg/dL Final   Creatinine, Ser  Date Value Ref Range Status  02/02/2023 0.90 0.44 - 1.00 mg/dL Final         Passed - Patient is not pregnant      Passed - Last BP in normal range    BP Readings from Last 1 Encounters:  03/30/23 124/66         Passed - Valid encounter within last 6 months    Recent Outpatient Visits           1 month ago Leg heaviness   [place] [facility] at [facility], Nyoka Cowden, MD   3 months ago Essential (primary) hypertension   [place] [facility] at [facility], Nyoka Cowden, MD   7 months ago Benign esophageal stricture   [facility] at [facility], Nyoka Cowden, MD   7 months ago Annual physical exam   [facility] at [facility], Nyoka Cowden, MD   10 months ago Essential (primary) hypertension   [facility] at [facility], Nyoka Cowden, MD       Future Appointments             In 2 weeks Judithann Graves, Nyoka Cowden, MD [facility] Medicine at [facility], [facility]   In 9 months Richardo Hanks, Laurette Schimke, MD [facility] [place]

## 2023-05-22 ENCOUNTER — Other Ambulatory Visit: Payer: Self-pay | Admitting: Internal Medicine

## 2023-05-22 DIAGNOSIS — I10 Essential (primary) hypertension: Secondary | ICD-10-CM

## 2023-05-23 ENCOUNTER — Other Ambulatory Visit: Payer: Self-pay | Admitting: Internal Medicine

## 2023-05-23 DIAGNOSIS — I10 Essential (primary) hypertension: Secondary | ICD-10-CM

## 2023-06-01 ENCOUNTER — Ambulatory Visit (INDEPENDENT_AMBULATORY_CARE_PROVIDER_SITE_OTHER): Payer: Medicare Other | Admitting: Internal Medicine

## 2023-06-01 ENCOUNTER — Encounter: Payer: Self-pay | Admitting: Internal Medicine

## 2023-06-01 VITALS — BP 129/72 | HR 110 | Ht 65.0 in | Wt 121.0 lb

## 2023-06-01 DIAGNOSIS — R29898 Other symptoms and signs involving the musculoskeletal system: Secondary | ICD-10-CM | POA: Diagnosis not present

## 2023-06-01 DIAGNOSIS — M2041 Other hammer toe(s) (acquired), right foot: Secondary | ICD-10-CM

## 2023-06-01 DIAGNOSIS — I10 Essential (primary) hypertension: Secondary | ICD-10-CM | POA: Diagnosis not present

## 2023-06-01 NOTE — Assessment & Plan Note (Addendum)
Normal exam with stable BP on amlodipine, benicar and metoprolol.  Metoprolol dose changed to PM last visit due to complaint of leg heaviness. Symptoms are much improved but she also added an otc supplement as needed

## 2023-06-01 NOTE — Assessment & Plan Note (Signed)
Ongoing pain with distance walking Has been using her deceased husband's placard but needs to get one of her own.

## 2023-06-01 NOTE — Assessment & Plan Note (Signed)
Much improved with a homeopathic restless leg supplement She can continue to take this daily as needed

## 2023-06-01 NOTE — Progress Notes (Signed)
Date:  06/01/2023   Name:  Kendra Lane   DOB:  [birth year]   MRN:  [id number]   Chief Complaint: Hypertension  Hypertension This is a chronic problem. The problem is controlled. Pertinent negatives include no chest pain, headaches, palpitations or shortness of breath. Past treatments include calcium channel blockers, beta blockers and angiotensin blockers. The current treatment provides significant improvement.   RLS medication - she has been taking a homeopathic preparation and her leg symptoms have improved.  She takes this about three times a week.   Lab Results  Component Value Date   NA 142 07/18/2022   K 3.6 07/18/2022   CO2 39 (A) 07/18/2022   GLUCOSE 108 (H) 05/10/2022   BUN 10 07/18/2022   CREATININE 0.90 02/02/2023   CALCIUM 9.8 07/18/2022   EGFR 83 07/18/2022   GFRNONAA >60 05/10/2022   Lab Results  Component Value Date   CHOL 215 (H) 09/16/2022   HDL 80 09/16/2022   LDLCALC 120 (H) 09/16/2022   TRIG 74 09/16/2022   CHOLHDL 2.7 09/16/2022   Lab Results  Component Value Date   TSH 1.90 07/18/2022   Lab Results  Component Value Date   HGBA1C 5.8 (H) 09/16/2022   Lab Results  Component Value Date   WBC 8.4 07/18/2022   HGB 12.9 07/18/2022   HCT 39 07/18/2022   MCV 90 06/16/2021   PLT 362 07/18/2022   Lab Results  Component Value Date   ALT 14 07/18/2022   AST 19 07/18/2022   ALKPHOS 38 07/18/2022   BILITOT 0.3 06/16/2021   Lab Results  Component Value Date   VD25OH 36.4 06/16/2021     Review of Systems  Constitutional:  Negative for fatigue and unexpected weight change.  HENT:  Negative for nosebleeds.   Eyes:  Negative for visual disturbance.  Respiratory:  Negative for cough, chest tightness, shortness of breath and wheezing.   Cardiovascular:  Negative for chest pain, palpitations and leg swelling.  Gastrointestinal:  Negative for abdominal pain, constipation and diarrhea.  Musculoskeletal:  Positive for arthralgias and gait  problem.  Neurological:  Negative for dizziness, weakness, light-headedness and headaches.    Patient Active Problem List   Diagnosis Date Noted   Leg heaviness 06/01/2023   Mixed hyperlipidemia 09/16/2022   Prediabetes 09/16/2022   Asymptomatic microscopic hematuria 09/16/2022   Age-related osteoporosis without current pathological fracture 06/12/2020   Aortic atherosclerosis (HCC) 04/02/2020   Arthritis of shoulder 11/30/2017   Bigeminy 04/07/2016   Benign esophageal stricture 01/21/2015   Essential (primary) hypertension 01/21/2015   Hammer toe 01/21/2015   Lipoma of skin and subcutaneous tissue of trunk 01/21/2015    Allergies  Allergen Reactions   Valsartan Other (See Comments)    dizziness    Past Surgical History:  Procedure Laterality Date   ABDOMINAL HYSTERECTOMY     CATARACT EXTRACTION W/PHACO Right 07/23/2020   Procedure: CATARACT EXTRACTION PHACO AND INTRAOCULAR LENS PLACEMENT (IOC) RIGHT 7.62 01:26.5 8.8%;  Surgeon: Lockie Mola, MD;  Location: [facility];  Service: Ophthalmology;  Laterality: Right;   CATARACT EXTRACTION W/PHACO Left 04/01/2021   Procedure: CATARACT EXTRACTION PHACO AND INTRAOCULAR LENS PLACEMENT (IOC) LEFT 3.64 00:56.9;  Surgeon: Lockie Mola, MD;  Location: [facility];  Service: Ophthalmology;  Laterality: Left;   ESOPHAGOGASTRODUODENOSCOPY (EGD) WITH ESOPHAGEAL DILATION  10/2014   Dr. Servando Snare   TOTAL VAGINAL HYSTERECTOMY  1993   TUBAL LIGATION      Social History   Tobacco Use  Smoking status: Never    Passive exposure: Never   Smokeless tobacco: Never   Tobacco comments:    smoking cessation materials not required  Vaping Use   Vaping status: Never Used  Substance Use Topics   Alcohol use: Not Currently    Alcohol/week: 2.0 standard drinks of alcohol    Types: 2 Cans of beer per week    Comment: last use 08/2019   Drug use: No     Medication list has been reviewed and updated.  Current Meds   Medication Sig   amLODipine (NORVASC) 10 MG tablet TAKE 1 TABLET BY MOUTH DAILY   aspirin 81 MG tablet Take 81 mg by mouth daily.   calcium-vitamin D (OSCAL WITH D) 500-200 MG-UNIT tablet Take 1 tablet by mouth. 600 mg daily   diclofenac Sodium (VOLTAREN) 1 % GEL Apply 2 g topically 4 (four) times daily.   metoprolol succinate (TOPROL-XL) 25 MG 24 hr tablet TAKE 3 TABLETS(75 MG) BY MOUTH DAILY   Multiple Vitamins-Minerals (CENTRUM SILVER 50+WOMEN PO) Take 1 tablet by mouth daily.   mupirocin ointment (BACTROBAN) 2 % Apply 1 Application topically 2 (two) times daily.   olmesartan (BENICAR) 40 MG tablet TAKE 1 TABLET(40 MG) BY MOUTH DAILY   omeprazole (PRILOSEC) 20 MG capsule TAKE 1 CAPSULE(20 MG) BY MOUTH DAILY   spironolactone (ALDACTONE) 25 MG tablet TAKE 1 TABLET(25 MG) BY MOUTH DAILY   [DISCONTINUED] spironolactone (ALDACTONE) 25 MG tablet TAKE 1 TABLET(25 MG) BY MOUTH DAILY       06/01/2023    2:23 PM 03/30/2023    9:55 AM 01/17/2023    9:41 AM 10/14/2022    9:28 AM  GAD 7 : Generalized Anxiety Score  Nervous, Anxious, on Edge 2 0 2 2  Control/stop worrying 2 0 2 3  Worry too much - different things 2 0 3 3  Trouble relaxing 0 0 0 0  Restless 1 0 2 1  Easily annoyed or irritable 2 0 2 2  Afraid - awful might happen 1 0 1 0  Total GAD 7 Score 10 0 12 11  Anxiety Difficulty Somewhat difficult Not difficult at all Somewhat difficult Somewhat difficult       06/01/2023    2:23 PM 03/30/2023    9:55 AM 01/17/2023    9:41 AM  Depression screen PHQ 2/9  Decreased Interest 1 0 2  Down, Depressed, Hopeless 1 0 2  PHQ - 2 Score 2 0 4  Altered sleeping 1 0 3  Tired, decreased energy 2 0 2  Change in appetite 0 0 0  Feeling bad or failure about yourself  0 0 1  Trouble concentrating 2 0 1  Moving slowly or fidgety/restless 2 0 1  Suicidal thoughts 0 0 0  PHQ-9 Score 9 0 12  Difficult doing work/chores Not difficult at all Not difficult at all Somewhat difficult    BP Readings from  Last 3 Encounters:  06/01/23 129/72  03/30/23 124/66  03/21/23 (!) 163/80    Physical Exam Vitals and nursing note reviewed.  Constitutional:      General: She is not in acute distress.    Appearance: Normal appearance. She is well-developed.  HENT:     Head: Normocephalic and atraumatic.  Cardiovascular:     Rate and Rhythm: Normal rate and regular rhythm.     Pulses: Normal pulses.  Pulmonary:     Effort: Pulmonary effort is normal. No respiratory distress.     Breath sounds: No  wheezing or rhonchi.  Musculoskeletal:     Cervical back: Normal range of motion.     Right lower leg: No edema.     Left lower leg: No edema.  Skin:    General: Skin is warm and dry.     Findings: No rash.  Neurological:     Mental Status: She is alert and oriented to person, place, and time.  Psychiatric:        Mood and Affect: Mood normal.        Behavior: Behavior normal.     Wt Readings from Last 3 Encounters:  06/01/23 121 lb (54.9 kg)  03/30/23 120 lb (54.4 kg)  02/09/23 126 lb (57.2 kg)    BP 129/72 (BP Location: Right Arm, Cuff Size: Normal)   Pulse (!) 110   Ht 5\' 5"  (1.651 m)   Wt 121 lb (54.9 kg)   SpO2 98%   BMI 20.14 kg/m   Assessment and Plan:  Problem List Items Addressed This Visit       Unprioritized   Leg heaviness    Much improved with a homeopathic restless leg supplement She can continue to take this daily as needed      Hammer toe    Ongoing pain with distance walking Has been using her deceased husband's placard but needs to get one of her own.      Essential (primary) hypertension - Primary (Chronic)    Normal exam with stable BP on amlodipine, benicar and metoprolol.  Metoprolol dose changed to PM last visit due to complaint of leg heaviness. Symptoms are much improved but she also added an otc supplement as needed        Return in about 6 months (around 11/29/2023) for HTN.    Reubin Milan, MD [facility]  Med[facility] [place]

## 2023-06-15 ENCOUNTER — Ambulatory Visit: Payer: Medicare Other

## 2023-06-15 VITALS — BP 132/72 | Ht 65.0 in | Wt 121.2 lb

## 2023-06-15 DIAGNOSIS — Z0000 Encounter for general adult medical examination without abnormal findings: Secondary | ICD-10-CM | POA: Diagnosis not present

## 2023-06-15 NOTE — Patient Instructions (Addendum)
Kendra Lane , Thank you for taking time to come for your Medicare Wellness Visit. I appreciate your ongoing commitment to your health goals. Please review the following plan we discussed and let me know if I can assist you in the future.   Referrals/Orders/Follow-Ups/Clinician Recommendations: none  This is a list of the screening recommended for you and due dates:  Health Maintenance  Topic Date Due   DTaP/Tdap/Td vaccine (1 - Tdap) Never done   DEXA scan (bone density measurement)  06/15/2020   COVID-19 Vaccine (3 - Moderna risk series) 06/17/2023*   Zoster (Shingles) Vaccine (1 of 2) 08/31/2023*   Flu Shot  12/12/2023*   Mammogram  11/29/2023   Medicare Annual Wellness Visit  06/14/2024   Pneumonia Vaccine  Completed   HPV Vaccine  Aged Out  *Topic was postponed. The date shown is not the original due date.    Advanced directives: (In Chart) A copy of your advanced directives are scanned into your chart should your provider ever need it.  Next Medicare Annual Wellness Visit scheduled for next year: Yes   06/19/24 @ 1:40 pm in person

## 2023-06-15 NOTE — Progress Notes (Addendum)
Subjective:   Kendra Lane is a 88 y.o. female who presents for Medicare Annual (Subsequent) preventive examination.  Visit Complete: In person  Cardiac Risk Factors include: advanced age (>27men, >65 women);hypertension;dyslipidemia     Objective:    Today's Vitals   06/15/23 1402 06/15/23 1406 06/15/23 1427  BP: (!) 160/72  132/72  Weight: 121 lb 3.2 oz (55 kg)    Height: 5\' 5"  (1.651 m)    PainSc:  4     Body mass index is 20.17 kg/m.     06/15/2023    2:12 PM 06/09/2022    9:49 AM 01/19/2022   11:04 AM 06/08/2021   10:07 AM 04/01/2021   12:27 PM 07/23/2020   11:21 AM 06/04/2020   10:18 AM  Advanced Directives  Does Patient Have a Medical Advance Directive? Yes Yes No Yes No No Yes  Type of Estate agent of [place];Living will   Healthcare Power of [place];Living will   Healthcare Power of [place];Living will  Does patient want to make changes to medical advance directive? No - Patient declined No - Patient declined       Copy of Healthcare Power of Attorney in Chart? Yes - validated most recent copy scanned in chart (See row information)   Yes - validated most recent copy scanned in chart (See row information)   Yes - validated most recent copy scanned in chart (See row information)  Would patient like information on creating a medical advance directive?     No - Patient declined No - Patient declined     Current Medications (verified) Outpatient Encounter Medications as of 06/15/2023  Medication Sig   amLODipine (NORVASC) 10 MG tablet TAKE 1 TABLET BY MOUTH DAILY   aspirin 81 MG tablet Take 81 mg by mouth daily.   calcium-vitamin D (OSCAL WITH D) 500-200 MG-UNIT tablet Take 1 tablet by mouth. 600 mg daily   diclofenac Sodium (VOLTAREN) 1 % GEL Apply 2 g topically 4 (four) times daily.   metoprolol succinate (TOPROL-XL) 25 MG 24 hr tablet TAKE 3 TABLETS(75 MG) BY MOUTH DAILY   Multiple Vitamins-Minerals (CENTRUM SILVER 50+WOMEN PO) Take 1  tablet by mouth daily.   mupirocin ointment (BACTROBAN) 2 % Apply 1 Application topically 2 (two) times daily.   olmesartan (BENICAR) 40 MG tablet TAKE 1 TABLET(40 MG) BY MOUTH DAILY   omeprazole (PRILOSEC) 20 MG capsule TAKE 1 CAPSULE(20 MG) BY MOUTH DAILY   spironolactone (ALDACTONE) 25 MG tablet TAKE 1 TABLET(25 MG) BY MOUTH DAILY   No facility-administered encounter medications on file as of 06/15/2023.    Allergies (verified) Valsartan   History: Past Medical History:  Diagnosis Date   Dysrhythmia    hx of, ok now   GERD (gastroesophageal reflux disease)    Headache    twice a day sometimes   Hypertension    Neck pain    Shoulder pain, left    Past Surgical History:  Procedure Laterality Date   ABDOMINAL HYSTERECTOMY     CATARACT EXTRACTION W/PHACO Right 07/23/2020   Procedure: CATARACT EXTRACTION PHACO AND INTRAOCULAR LENS PLACEMENT (IOC) RIGHT 7.62 01:26.5 8.8%;  Surgeon: Lockie Mola, MD;  Location: [facility];  Service: Ophthalmology;  Laterality: Right;   CATARACT EXTRACTION W/PHACO Left 04/01/2021   Procedure: CATARACT EXTRACTION PHACO AND INTRAOCULAR LENS PLACEMENT (IOC) LEFT 3.64 00:56.9;  Surgeon: Lockie Mola, MD;  Location: [facility];  Service: Ophthalmology;  Laterality: Left;   ESOPHAGOGASTRODUODENOSCOPY (EGD) WITH ESOPHAGEAL DILATION  10/2014   Dr. Servando Snare   TOTAL VAGINAL HYSTERECTOMY  1993   TUBAL LIGATION     Family History  Problem Relation Age of Onset   Hypertension Mother    Other Father        "old age"   Breast cancer Daughter 46   Social History   Socioeconomic History   Marital status: Widowed    Spouse name: Not on file   Number of children: 8   Years of education: Not on file   Highest education level: 10th grade  Occupational History   Occupation: Retired  Tobacco Use   Smoking status: Never    Passive exposure: Never   Smokeless tobacco: Never   Tobacco comments:    smoking cessation materials not  required  Vaping Use   Vaping status: Never Used  Substance and Sexual Activity   Alcohol use: Not Currently    Alcohol/week: 2.0 standard drinks of alcohol    Types: 2 Cans of beer per week    Comment: last use 08/2019   Drug use: No   Sexual activity: Not Currently  Other Topics Concern   Not on file  Social History Narrative   Pt's son lives with her   Social Determinants of Health   Financial Resource Strain: Low Risk  (06/15/2023)   Overall Financial Resource Strain (CARDIA)    Difficulty of Paying Living Expenses: Not hard at all  Food Insecurity: No Food Insecurity (06/15/2023)   Hunger Vital Sign    Worried About Running Out of Food in the Last Year: Never true    Ran Out of Food in the Last Year: Never true  Transportation Needs: No Transportation Needs (06/15/2023)   PRAPARE - Administrator, Civil Service (Medical): No    Lack of Transportation (Non-Medical): No  Physical Activity: Sufficiently Active (06/15/2023)   Exercise Vital Sign    Days of Exercise per Week: 7 days    Minutes of Exercise per Session: 30 min  Stress: No Stress Concern Present (06/15/2023)   [employer] of Occupational Health - Occupational Stress Questionnaire    Feeling of Stress : Only a little  Social Connections: Socially Isolated (06/15/2023)   Social Connection and Isolation Panel [NHANES]    Frequency of Communication with Friends and Family: More than three times a week    Frequency of Social Gatherings with Friends and Family: Twice a week    Attends Religious Services: Never    Database administrator or Organizations: No    Attends Banker Meetings: Never    Marital Status: Widowed    Tobacco Counseling Counseling given: Not Answered Tobacco comments: smoking cessation materials not required   Clinical Intake:  Pre-visit preparation completed: Yes  Pain : 0-10 Pain Score: 4  Pain Type: Chronic pain Pain Location: Shoulder Pain Orientation:  Right, Left Pain Descriptors / Indicators: Aching, Sore Pain Onset: More than a month ago Pain Frequency: Intermittent     Nutritional Status: BMI of 19-24  Normal Nutritional Risks: None Diabetes: No  How often do you need to have someone help you when you read instructions, pamphlets, or other written materials from your doctor or pharmacy?: 1 - Never  Interpreter Needed?: No  Information entered by :: Kennedy Bucker, LPN   Activities of Daily Living    06/15/2023    2:13 PM  In your present state of health, do you have any difficulty performing the following activities:  Hearing? 0  Vision?  0  Difficulty concentrating or making decisions? 0  Walking or climbing stairs? 0  Dressing or bathing? 0  Doing errands, shopping? 0  Preparing Food and eating ? N  Using the Toilet? N  In the past six months, have you accidently leaked urine? N  Do you have problems with loss of bowel control? N  Managing your Medications? N  Managing your Finances? N  Housekeeping or managing your Housekeeping? N    Patient Care Team: Reubin Milan, MD as PCP - General (Internal Medicine) Lamar Blinks, MD as Consulting Physician (Cardiology)  Indicate any recent Medical Services you may have received from other than [facility] providers in the past year (date may be approximate).     Assessment:   This is a routine wellness examination for E[place]ll.  Hearing/Vision screen Hearing Screening - Comments:: No aids Vision Screening - Comments:: Readers- [facility]   Goals Addressed             This Visit's Progress    DIET - EAT MORE FRUITS AND VEGETABLES         Depression Screen    06/15/2023    2:10 PM 06/01/2023    2:23 PM 03/30/2023    9:55 AM 01/17/2023    9:41 AM 10/14/2022    9:28 AM 09/16/2022    9:47 AM 07/02/2022    9:59 AM  PHQ 2/9 Scores  PHQ - 2 Score 1 2 0 4 2 3  0  PHQ- 9 Score 3 9 0 12 8 8 3     Fall Risk    06/15/2023    2:13 PM 06/01/2023    2:22 PM  03/30/2023    9:55 AM 01/17/2023    9:41 AM 10/14/2022    9:28 AM  Fall Risk   Falls in the past year? 0 0 0 0 0  Number falls in past yr: 0 0 0 0 0  Injury with Fall? 0 0 0 0 0  Risk for fall due to : No Fall Risks No Fall Risks No Fall Risks No Fall Risks No Fall Risks  Follow up Falls prevention discussed;Falls evaluation completed Falls evaluation completed Falls evaluation completed Falls evaluation completed Falls evaluation completed    MEDICARE RISK AT HOME: Medicare Risk at Home Any stairs in or around the home?: No If so, are there any without handrails?: No Home free of loose throw rugs in walkways, pet beds, electrical cords, etc?: Yes Adequate lighting in your home to reduce risk of falls?: Yes Life alert?: No Use of a cane, walker or w/c?: No Grab bars in the bathroom?: Yes Shower chair or bench in shower?: Yes Elevated toilet seat or a handicapped toilet?: Yes  TIMED UP AND GO:  Was the test performed?  Yes  Length of time to ambulate 10 feet: 4 sec Gait steady and fast without use of assistive device    Cognitive Function:    04/07/2016    9:17 AM  MMSE - Mini Mental State Exam  Orientation to time 5  Orientation to time comments 6CIT  Orientation to Place 5  Orientation to Place-comments 6CIT  Registration 3  Registration-comments 6CIT  Attention/ Calculation 5  Attention/Calculation-comments 6CIT  Recall 1  Recall-comments 6CIT  Language- name 2 objects 2  Language- name 2 objects-comments 6CIT        06/15/2023    2:14 PM 06/09/2022    9:51 AM 06/04/2020   10:20 AM 06/04/2019   10:14 AM 05/29/2018  10:17 AM  6CIT Screen  What Year? 0 points 0 points 0 points 0 points 0 points  What month? 3 points 0 points 0 points 0 points 0 points  What time? 3 points 0 points 0 points 0 points 0 points  Count back from 20 2 points 0 points 0 points 0 points 0 points  Months in reverse 0 points 0 points 0 points 0 points 0 points  Repeat phrase 4 points 4  points 2 points 2 points 6 points  Total Score 12 points 4 points 2 points 2 points 6 points    Immunizations Immunization History  Administered Date(s) Administered   Moderna Sars-Covid-2 Vaccination 02/25/2020, 03/24/2020   Pneumococcal Conjugate-13 04/07/2016   Pneumococcal Polysaccharide-23 06/26/2012    TDAP status: Due, Education has been provided regarding the importance of this vaccine. Advised may receive this vaccine at local pharmacy or Health Dept. Aware to provide a copy of the vaccination record if obtained from local pharmacy or Health Dept. Verbalized acceptance and understanding.  Flu Vaccine status: Declined, Education has been provided regarding the importance of this vaccine but patient still declined. Advised may receive this vaccine at local pharmacy or Health Dept. Aware to provide a copy of the vaccination record if obtained from local pharmacy or Health Dept. Verbalized acceptance and understanding.  Pneumococcal vaccine status: Up to date  Covid-19 vaccine status: Completed vaccines  Qualifies for Shingles Vaccine? Yes   Zostavax completed No   Shingrix Completed?: No.    Education has been provided regarding the importance of this vaccine. Patient has been advised to call insurance company to determine out of pocket expense if they have not yet received this vaccine. Advised may also receive vaccine at local pharmacy or Health Dept. Verbalized acceptance and understanding.  Screening Tests Health Maintenance  Topic Date Due   DTaP/Tdap/Td (1 - Tdap) Never done   DEXA SCAN  06/15/2020   COVID-19 Vaccine (3 - Moderna risk series) 06/17/2023 (Originally 04/21/2020)   Zoster Vaccines- Shingrix (1 of 2) 08/31/2023 (Originally 08/25/1954)   INFLUENZA VACCINE  12/12/2023 (Originally 04/14/2023)   MAMMOGRAM  11/29/2023   Medicare Annual Wellness (AWV)  06/14/2024   Pneumonia Vaccine 28+ Years old  Completed   HPV VACCINES  Aged Out    Health Maintenance  Health  Maintenance Due  Topic Date Due   DTaP/Tdap/Td (1 - Tdap) Never done   DEXA SCAN  06/15/2020    Colorectal cancer screening: No longer required.   Mammogram status: No longer required due to age.    Lung Cancer Screening: (Low Dose CT Chest recommended if Age 88-80 years, 20 pack-year currently smoking OR have quit w/in 15years.) does not qualify.    Additional Screening:  Hepatitis C Screening: does not qualify; Completed no  Vision Screening: Recommended annual ophthalmology exams for early detection of glaucoma and other disorders of the eye. Is the patient up to date with their annual eye exam?  Yes  Who is the provider or what is the name of the office in which the patient attends annual eye exams? [facility] If pt is not established with a provider, would they like to be referred to a provider to establish care? No .   Dental Screening: Recommended annual dental exams for proper oral hygiene   Community Resource Referral / Chronic Care Management: CRR required this visit?  No   CCM required this visit?  No     Plan:     I have personally reviewed  and noted the following in the patient's chart:   Medical and social history Use of alcohol, tobacco or illicit drugs  Current medications and supplements including opioid prescriptions. Patient is not currently taking opioid prescriptions. Functional ability and status Nutritional status Physical activity Advanced directives List of other physicians Hospitalizations, surgeries, and ER visits in previous 12 months Vitals Screenings to include cognitive, depression, and falls Referrals and appointments  In addition, I have reviewed and discussed with patient certain preventive protocols, quality metrics, and best practice recommendations. A written personalized care plan for preventive services as well as general preventive health recommendations were provided to patient.     Hal Hope, LPN   24/12/100    After Visit Summary: none  Nurse Notes: none

## 2023-06-24 ENCOUNTER — Other Ambulatory Visit: Payer: Self-pay | Admitting: Internal Medicine

## 2023-06-24 DIAGNOSIS — I10 Essential (primary) hypertension: Secondary | ICD-10-CM

## 2023-06-24 DIAGNOSIS — K222 Esophageal obstruction: Secondary | ICD-10-CM

## 2023-06-27 ENCOUNTER — Ambulatory Visit (INDEPENDENT_AMBULATORY_CARE_PROVIDER_SITE_OTHER): Payer: Medicare Other

## 2023-06-27 ENCOUNTER — Ambulatory Visit
Admission: EM | Admit: 2023-06-27 | Discharge: 2023-06-27 | Disposition: A | Discharge status: Home / Self Care | Payer: Medicare Other | Attending: Family Medicine | Admitting: Family Medicine

## 2023-06-27 DIAGNOSIS — M25512 Pain in left shoulder: Secondary | ICD-10-CM

## 2023-06-27 DIAGNOSIS — M25511 Pain in right shoulder: Secondary | ICD-10-CM | POA: Diagnosis not present

## 2023-06-27 DIAGNOSIS — M19011 Primary osteoarthritis, right shoulder: Secondary | ICD-10-CM

## 2023-06-27 DIAGNOSIS — M778 Other enthesopathies, not elsewhere classified: Secondary | ICD-10-CM | POA: Diagnosis not present

## 2023-06-27 DIAGNOSIS — M19012 Primary osteoarthritis, left shoulder: Secondary | ICD-10-CM | POA: Diagnosis not present

## 2023-06-27 DIAGNOSIS — G8929 Other chronic pain: Secondary | ICD-10-CM | POA: Diagnosis not present

## 2023-06-27 MED ORDER — TRAMADOL HCL 50 MG PO TABS: 50.0000 mg | ORAL_TABLET | Freq: Two times a day (BID) | ORAL | 0 refills | Status: DC | PRN

## 2023-06-27 MED ORDER — KETOROLAC TROMETHAMINE 60 MG/2ML IM SOLN
15.0000 mg | Freq: Once | INTRAMUSCULAR | Status: AC
  Administered 2023-06-27: 15 mg via INTRAMUSCULAR

## 2023-06-27 NOTE — ED Provider Notes (Signed)
[facility]    CSN: [id number] Arrival date & time: 06/27/23  0841      History   Chief Complaint Chief Complaint  Patient presents with   Shoulder Pain    HPI  HPI Kendra Lane is a 88 y.o. female.   Kendra Lane presents for bilateral shoulder pain for over a year.  Pain is intermittent but here recently it has keeping up at night. Has a throbbing pain. Took Tylenol for pain but rarely takes it as she states it doesn't help her. When she moves a certain way, she gets a catch and then the pain comes again.  Denies recent fall or injury.  There has been no abnormal pops or sounds.  Continues to have pain with movement of her arms.      Past Medical History:  Diagnosis Date   Dysrhythmia    hx of, ok now   GERD (gastroesophageal reflux disease)    Headache    twice a day sometimes   Hypertension    Neck pain    Shoulder pain, left     Patient Active Problem List   Diagnosis Date Noted   Leg heaviness 06/01/2023   Mixed hyperlipidemia 09/16/2022   Prediabetes 09/16/2022   Asymptomatic microscopic hematuria 09/16/2022   Age-related osteoporosis without current pathological fracture 06/12/2020   Aortic atherosclerosis (HCC) 04/02/2020   Arthritis of shoulder 11/30/2017   Bigeminy 04/07/2016   Benign esophageal stricture 01/21/2015   Essential (primary) hypertension 01/21/2015   Hammer toe 01/21/2015   Lipoma of skin and subcutaneous tissue of trunk 01/21/2015    Past Surgical History:  Procedure Laterality Date   ABDOMINAL HYSTERECTOMY     CATARACT EXTRACTION W/PHACO Right 07/23/2020   Procedure: CATARACT EXTRACTION PHACO AND INTRAOCULAR LENS PLACEMENT (IOC) RIGHT 7.62 01:26.5 8.8%;  Surgeon: Lockie Mola, MD;  Location: [facility];  Service: Ophthalmology;  Laterality: Right;   CATARACT EXTRACTION W/PHACO Left 04/01/2021   Procedure: CATARACT EXTRACTION PHACO AND INTRAOCULAR LENS PLACEMENT (IOC) LEFT 3.64 00:56.9;  Surgeon:  Lockie Mola, MD;  Location: [facility];  Service: Ophthalmology;  Laterality: Left;   ESOPHAGOGASTRODUODENOSCOPY (EGD) WITH ESOPHAGEAL DILATION  10/2014   Dr. Servando Snare   TOTAL VAGINAL HYSTERECTOMY  1993   TUBAL LIGATION      OB History   No obstetric history on file.      Home Medications    Prior to Admission medications   Medication Sig Start Date End Date Taking? Authorizing Provider  traMADol (ULTRAM) 50 MG tablet Take 1 tablet (50 mg total) by mouth every 12 (twelve) hours as needed. 06/27/23  Yes Eldridge Marcott, DO  amLODipine (NORVASC) 10 MG tablet TAKE 1 TABLET BY MOUTH DAILY 06/24/23   Reubin Milan, MD  aspirin 81 MG tablet Take 81 mg by mouth daily.    [provider]  calcium-vitamin D (OSCAL WITH D) 500-200 MG-UNIT tablet Take 1 tablet by mouth. 600 mg daily    [provider]  diclofenac Sodium (VOLTAREN) 1 % GEL Apply 2 g topically 4 (four) times daily. 01/19/22   Isa Rankin, MD  metoprolol succinate (TOPROL-XL) 25 MG 24 hr tablet TAKE 3 TABLETS(75 MG) BY MOUTH DAILY 12/24/22   Reubin Milan, MD  Multiple Vitamins-Minerals (CENTRUM SILVER 50+WOMEN PO) Take 1 tablet by mouth daily.    [provider]  mupirocin ointment (BACTROBAN) 2 % Apply 1 Application topically 2 (two) times daily. 01/05/23   Katha Cabal, DO  olmesartan (BENICAR) 40  MG tablet TAKE 1 TABLET(40 MG) BY MOUTH DAILY 05/13/23   Reubin Milan, MD  omeprazole (PRILOSEC) 20 MG capsule TAKE 1 CAPSULE(20 MG) BY MOUTH DAILY 06/24/23   Reubin Milan, MD  spironolactone (ALDACTONE) 25 MG tablet TAKE 1 TABLET(25 MG) BY MOUTH DAILY 05/23/23   Reubin Milan, MD    Family History Family History  Problem Relation Age of Onset   Hypertension Mother    Other Father        "old age"   Breast cancer Daughter 41    Social History Social History   Tobacco Use   Smoking status: Never    Passive exposure: Never   Smokeless tobacco: Never    Tobacco comments:    smoking cessation materials not required  Vaping Use   Vaping status: Never Used  Substance Use Topics   Alcohol use: Not Currently    Alcohol/week: 2.0 standard drinks of alcohol    Types: 2 Cans of beer per week    Comment: last use 08/2019   Drug use: No     Allergies   Valsartan   Review of Systems Review of Systems: :negative unless otherwise stated in HPI.      Physical Exam Triage Vital Signs ED Triage Vitals  Encounter Vitals Group     BP 06/27/23 0911 (!) 168/86     Systolic BP Percentile --      Diastolic BP Percentile --      Pulse Rate 06/27/23 0911 86     Resp 06/27/23 0911 16     Temp 06/27/23 0911 98.1 F (36.7 C)     Temp Source 06/27/23 0911 Oral     SpO2 06/27/23 0911 100 %     Weight --      Height --      Head Circumference --      Peak Flow --      Pain Score 06/27/23 0910 0     Pain Loc --      Pain Education --      Exclude from Growth Chart --    No data found.  Updated Vital Signs BP (!) 168/86 (BP Location: Left Arm)   Pulse 86   Temp 98.1 F (36.7 C) (Oral)   Resp 16   SpO2 100%   Visual Acuity Right Eye Distance:   Left Eye Distance:   Bilateral Distance:    Right Eye Near:   Left Eye Near:    Bilateral Near:     Physical Exam GEN: well appearing female in no acute distress  CVS: well perfused  RESP: speaking in full sentences without pause, no respiratory distress  MSK:   bilateral shoulder:  No evidence of bony deformity,  asymmetry. Age related musclemuscle atrophy. No tenderness over long head of biceps (bicipital groove).  TTP at [facility] joint.  Good active and passive (ABD, ADD, Flexion, extension, IR, ER).  Strength 4+/5 grip, elbow and shoulder.  No abnormal scapular function observed.  Special Tests: Hawkins: positive; Empty Can: negative, Neer's: Negative; Painful arc: Negative; Anterior Apprehension: Negative Sensation intact. Peripheral pulses intact.   UC Treatments / Results   Labs (all labs ordered are listed, but only abnormal results are displayed) Labs Reviewed - No data to display  EKG   Radiology DG Shoulder Left  Result Date: 06/27/2023 CLINICAL DATA:  Chronic bilateral shoulder pain for 1 year. Pain worsens last night. EXAM: LEFT SHOULDER - 2+ VIEW COMPARISON:  Left shoulder radiographs 07/13/2017 FINDINGS:  Mild acromioclavicular joint space narrowing and peripheral osteophytosis. Mild-to-moderate lateral downsloping of the acromion with mild distal acromial spurring. Mild osteophytosis with an adjacent superolateral humeral head greater tuberosity. No acute fracture or dislocation. The visualized portion of the left lung is unremarkable. IMPRESSION: 1. Mild acromioclavicular osteoarthritis. 2. Mild-to-moderate lateral downsloping of the acromion with mild distal acromial spurring. Electronically Signed   By: Neita Garnet M.D.   On: 06/27/2023 12:55   DG Shoulder Right  Result Date: 06/27/2023 CLINICAL DATA:  Intermittent pain for a year. EXAM: RIGHT SHOULDER - 2+ VIEW COMPARISON:  Right shoulder radiographs 01/19/2022 FINDINGS: Moderate acromioclavicular joint space narrowing with mild peripheral osteophytosis. Moderate lateral downsloping of the acromion with mild distal lateral acromial spurring and inferior cortical sclerosis suggesting chronic subacromial impingement. There is moderate spurring within the adjacent humeral head greater tuberosity. No acute fracture or dislocation. IMPRESSION: 1. Moderate acromioclavicular osteoarthritis. 2. Findings suggestive of chronic subacromial impingement. Electronically Signed   By: Neita Garnet M.D.   On: 06/27/2023 12:53     Procedures Procedures (including critical care time)  Medications Ordered in UC Medications  ketorolac (TORADOL) injection 15 mg (15 mg Intramuscular Given 06/27/23 1051)    Initial Impression / Assessment and Plan / UC Course  I have reviewed the triage vital signs and the nursing  notes.  Pertinent labs & imaging results that were available during my care of the patient were reviewed by me and considered in my medical decision making (see chart for details).      Pt is a 88 y.o.  female with intermittent bilateral shoulder pain for the past year or so. On exam, pt has tenderness at [facility] joint.  Given Toradol 15 mg IM for pain with some relief.  On chart review, right shoulder xray from 01/19/22 showed subacromial spurring which may have progressed from 2018. Shoulder xray from 07/13/2017 showed mild narrowing of the [facility] joint on the left and  degenerative canges in the right [facility] on the right.   Obtained bilateral shoulder plain films.  Personally interpreted by me were unremarkable for fracture or dislocation. Radiologist report reviewed and additionally notes bilateral AC osteoarthritis, right chronic subacromial impingement and left mild to moderate lateral downsloping of the acromion with mild distal acromial spurring.  Patient to gradually return to normal activities, as tolerated and continue ordinary activities within the limits permitted by pain. Prescribed tramadol for pain relief.  Tylenol 1000 mg 3 times daily PRN.   Patient to follow up with orthopedic provider, if symptoms do not improve with conservative treatment.  Return and ED precautions given. Understanding voiced. Discussed MDM, treatment plan and plan for follow-up with patient who agrees with plan.   Final Clinical Impressions(s) / UC Diagnoses   Final diagnoses:  Chronic pain of both shoulders  Primary osteoarthritis of both shoulders     Discharge Instructions      Take Tylenol 1000 mg three times a day for pain.  For severe pain, take Tramadol as needed. Follow up with [facility] for next steps.   On my review of your xray images, you did not have any broken or dislocated bones. The radiologist has not yet read your xray. If it is significantly abnormal or urgent, someone will contact you.  You  should see your results in MyChart.         ED Prescriptions     Medication Sig Dispense Auth. Provider   traMADol (ULTRAM) 50 MG tablet Take 1 tablet (50 mg total) by  mouth every 12 (twelve) hours as needed. 10 tablet Katha Cabal, DO      I have reviewed the PDMP during this encounter.   Katha Cabal, DO 06/27/23 1554

## 2023-06-27 NOTE — ED Triage Notes (Signed)
Patient presents to [facility] for chronic bilateral shoulder pain x 1 year. States pain worsened last night. No pain at rest, pain when lifting arms.Treating with tylenol, last dose was on Saturday.

## 2023-06-27 NOTE — Discharge Instructions (Addendum)
Take Tylenol 1000 mg three times a day for pain.  For severe pain, take Tramadol as needed. Follow up with [facility] for next steps.   On my review of your xray images, you did not have any broken or dislocated bones. The radiologist has not yet read your xray. If it is significantly abnormal or urgent, someone will contact you.  You should see your results in MyChart.

## 2023-07-27 ENCOUNTER — Ambulatory Visit
Admission: EM | Admit: 2023-07-27 | Discharge: 2023-07-27 | Disposition: A | Discharge status: Home / Self Care | Payer: Medicare Other | Attending: Family Medicine | Admitting: Family Medicine

## 2023-07-27 ENCOUNTER — Ambulatory Visit: Payer: Medicare Other

## 2023-07-27 DIAGNOSIS — R079 Chest pain, unspecified: Secondary | ICD-10-CM | POA: Diagnosis not present

## 2023-07-27 DIAGNOSIS — M25512 Pain in left shoulder: Secondary | ICD-10-CM | POA: Insufficient documentation

## 2023-07-27 DIAGNOSIS — G8929 Other chronic pain: Secondary | ICD-10-CM | POA: Insufficient documentation

## 2023-07-27 DIAGNOSIS — R0789 Other chest pain: Secondary | ICD-10-CM | POA: Insufficient documentation

## 2023-07-27 LAB — CBC WITH DIFFERENTIAL/PLATELET
Abs Immature Granulocytes: 0.01 10*3/uL (ref 0.00–0.07)
Basophils Absolute: 0.1 10*3/uL (ref 0.0–0.1)
Basophils Relative: 1 %
Eosinophils Absolute: 0.1 10*3/uL (ref 0.0–0.5)
Eosinophils Relative: 2 %
HCT: 39.4 % (ref 36.0–46.0)
Hemoglobin: 13.3 g/dL (ref 12.0–15.0)
Immature Granulocytes: 0 %
Lymphocytes Relative: 36 %
Lymphs Abs: 1.6 10*3/uL (ref 0.7–4.0)
MCH: 30.9 pg (ref 26.0–34.0)
MCHC: 33.8 g/dL (ref 30.0–36.0)
MCV: 91.4 fL (ref 80.0–100.0)
Monocytes Absolute: 0.4 10*3/uL (ref 0.1–1.0)
Monocytes Relative: 10 %
Neutro Abs: 2.2 10*3/uL (ref 1.7–7.7)
Neutrophils Relative %: 51 %
Platelets: 323 10*3/uL (ref 150–400)
RBC: 4.31 MIL/uL (ref 3.87–5.11)
RDW: 12.7 % (ref 11.5–15.5)
WBC: 4.4 10*3/uL (ref 4.0–10.5)
nRBC: 0 % (ref 0.0–0.2)

## 2023-07-27 LAB — TROPONIN I (HIGH SENSITIVITY): Troponin I (High Sensitivity): 3 ng/L (ref <18)

## 2023-07-27 LAB — COMPREHENSIVE METABOLIC PANEL
ALT: 13 U/L (ref 0–44)
AST: 18 U/L (ref 15–41)
Albumin: 4.5 g/dL (ref 3.5–5.0)
Alkaline Phosphatase: 35 U/L — ABNORMAL LOW (ref 38–126)
Anion gap: 13 (ref 5–15)
BUN: 15 mg/dL (ref 8–23)
CO2: 25 mmol/L (ref 22–32)
Calcium: 9.6 mg/dL (ref 8.9–10.3)
Chloride: 101 mmol/L (ref 98–111)
Creatinine, Ser: 0.83 mg/dL (ref 0.44–1.00)
GFR, Estimated: >60 mL/min (ref >60)
Glucose, Bld: 110 mg/dL — ABNORMAL HIGH (ref 70–99)
Potassium: 4.1 mmol/L (ref 3.5–5.1)
Sodium: 139 mmol/L (ref 135–145)
Total Bilirubin: 0.8 mg/dL (ref <1.2)
Total Protein: 8.1 g/dL (ref 6.5–8.1)

## 2023-07-27 NOTE — Discharge Instructions (Addendum)
Follow up with your heart doctor and primary care doctor, as discussed.    Your chest xray did not show evidence of rib fracture, pneumonia or fluid in your lung. The radiologist has not yet read it, if they find something that I didn't, I will call you.

## 2023-07-27 NOTE — ED Provider Notes (Signed)
[facility]    CSN: [id number] Arrival date & time: 07/27/23  0902      History   Chief Complaint Chief Complaint  Patient presents with   Shoulder Pain    HPI  HPI Kendra Lane is a 88 y.o. female.   Kendra Lane presents for intermittent left sided chest discomfort that radiates to left shoulder for 3 or more weeks. Has right shoulder pain as well. Pain is worse when she leans forward or bend over to pick up something she "feels a lil something." Discomfort described as "just a lil something."  Tried Tylenol and muscle rubs that help somewhat.   No dyspepsia, shortness of breath, dyspnea on exertion, paroxysmal nocturnal dyspnea, leg edema, nausea or vomiting.  No recent travel.    Notes she continues to lift the heavy rainwater buckets to water her plants.        Past Medical History:  Diagnosis Date   Dysrhythmia    hx of, ok now   GERD (gastroesophageal reflux disease)    Headache    twice a day sometimes   Hypertension    Neck pain    Shoulder pain, left     Patient Active Problem List   Diagnosis Date Noted   Leg heaviness 06/01/2023   Mixed hyperlipidemia 09/16/2022   Prediabetes 09/16/2022   Asymptomatic microscopic hematuria 09/16/2022   Age-related osteoporosis without current pathological fracture 06/12/2020   Aortic atherosclerosis (HCC) 04/02/2020   Arthritis of shoulder 11/30/2017   Bigeminy 04/07/2016   Benign esophageal stricture 01/21/2015   Essential (primary) hypertension 01/21/2015   Hammer toe 01/21/2015   Lipoma of skin and subcutaneous tissue of trunk 01/21/2015    Past Surgical History:  Procedure Laterality Date   ABDOMINAL HYSTERECTOMY     CATARACT EXTRACTION W/PHACO Right 07/23/2020   Procedure: CATARACT EXTRACTION PHACO AND INTRAOCULAR LENS PLACEMENT (IOC) RIGHT 7.62 01:26.5 8.8%;  Surgeon: Lockie Mola, MD;  Location: [facility];  Service: Ophthalmology;  Laterality: Right;   CATARACT  EXTRACTION W/PHACO Left 04/01/2021   Procedure: CATARACT EXTRACTION PHACO AND INTRAOCULAR LENS PLACEMENT (IOC) LEFT 3.64 00:56.9;  Surgeon: Lockie Mola, MD;  Location: [facility];  Service: Ophthalmology;  Laterality: Left;   ESOPHAGOGASTRODUODENOSCOPY (EGD) WITH ESOPHAGEAL DILATION  10/2014   Dr. Servando Snare   TOTAL VAGINAL HYSTERECTOMY  1993   TUBAL LIGATION      OB History   No obstetric history on file.      Home Medications    Prior to Admission medications   Medication Sig Start Date End Date Taking? Authorizing Provider  amLODipine (NORVASC) 10 MG tablet TAKE 1 TABLET BY MOUTH DAILY 06/24/23  Yes Reubin Milan, MD  aspirin 81 MG tablet Take 81 mg by mouth daily.   Yes [provider]  calcium-vitamin D (OSCAL WITH D) 500-200 MG-UNIT tablet Take 1 tablet by mouth. 600 mg daily   Yes [provider]  metoprolol succinate (TOPROL-XL) 25 MG 24 hr tablet TAKE 3 TABLETS(75 MG) BY MOUTH DAILY 12/24/22  Yes Reubin Milan, MD  Multiple Vitamins-Minerals (CENTRUM SILVER 50+WOMEN PO) Take 1 tablet by mouth daily.   Yes [provider]  olmesartan (BENICAR) 40 MG tablet TAKE 1 TABLET(40 MG) BY MOUTH DAILY 05/13/23  Yes Reubin Milan, MD  omeprazole (PRILOSEC) 20 MG capsule TAKE 1 CAPSULE(20 MG) BY MOUTH DAILY 06/24/23  Yes Reubin Milan, MD  spironolactone (ALDACTONE) 25 MG tablet TAKE 1 TABLET(25 MG) BY MOUTH DAILY 05/23/23  Yes  Reubin Milan, MD  traMADol (ULTRAM) 50 MG tablet Take 1 tablet (50 mg total) by mouth every 12 (twelve) hours as needed. 06/27/23  Yes Antwane Grose, DO  diclofenac Sodium (VOLTAREN) 1 % GEL Apply 2 g topically 4 (four) times daily. 01/19/22   Isa Rankin, MD  mupirocin ointment (BACTROBAN) 2 % Apply 1 Application topically 2 (two) times daily. 01/05/23   Katha Cabal, DO    Family History Family History  Problem Relation Age of Onset   Hypertension Mother    Other Father        "old age"    Breast cancer Daughter 53    Social History Social History   Tobacco Use   Smoking status: Never    Passive exposure: Never   Smokeless tobacco: Never   Tobacco comments:    smoking cessation materials not required  Vaping Use   Vaping status: Never Used  Substance Use Topics   Alcohol use: Not Currently    Alcohol/week: 2.0 standard drinks of alcohol    Types: 2 Cans of beer per week    Comment: last use 08/2019   Drug use: No     Allergies   Valsartan   Review of Systems Review of Systems: :negative unless otherwise stated in HPI.      Physical Exam Triage Vital Signs ED Triage Vitals  Encounter Vitals Group     BP 07/27/23 0914 (!) 163/80     Systolic BP Percentile --      Diastolic BP Percentile --      Pulse Rate 07/27/23 0914 84     Resp 07/27/23 0914 19     Temp 07/27/23 0914 99.1 F (37.3 C)     Temp Source 07/27/23 0914 Oral     SpO2 07/27/23 0914 100 %     Weight --      Height --      Head Circumference --      Peak Flow --      Pain Score 07/27/23 0912 0     Pain Loc --      Pain Education --      Exclude from Growth Chart --    No data found.  Updated Vital Signs BP (!) 163/80 (BP Location: Left Arm)   Pulse 84   Temp 99.1 F (37.3 C) (Oral)   Resp 19   SpO2 100%   Visual Acuity Right Eye Distance:   Left Eye Distance:   Bilateral Distance:    Right Eye Near:   Left Eye Near:    Bilateral Near:     Physical Exam GEN: well appearing female in no acute distress  CVS: well perfused, regular rate and rhythm, no murmurs, rubs or gallops   RESP: speaking in full sentences without pause, no respiratory distress, clear to auscultation bilaterally CHEST WALL:  frail, no ecchymoses or erythema, non-tender to palpation    MSK:  Bilateral shoulder:  No evidence of bony deformity,  asymmetry. Age related musclemuscle atrophy. No tenderness over long head of biceps (bicipital groove).  TTP at [facility] joint.  Good active and passive (ABD,  ADD, Flexion, extension, IR, ER).  Strength 4+/5 grip, elbow and shoulder.  No abnormal scapular function observed.  Special Tests: Hawkins: positive; Empty Can: negative, Neer's: Negative; Painful arc: Negative; Anterior Apprehension: Negative Sensation intact. Peripheral pulses intact.   UC Treatments / Results  Labs (all labs ordered are listed, but only abnormal results are displayed) Labs Reviewed  COMPREHENSIVE  METABOLIC PANEL - Abnormal; Notable for the following components:      Result Value   Glucose, Bld 110 (*)    Alkaline Phosphatase 35 (*)    All other components within normal limits  CBC WITH DIFFERENTIAL/PLATELET  TROPONIN I (HIGH SENSITIVITY)    EKG   Radiology DG Chest 2 View  Result Date: 07/27/2023 CLINICAL DATA:  Chest pain for 3 weeks. EXAM: CHEST - 2 VIEW COMPARISON:  October 07, 2016. FINDINGS: The heart size and mediastinal contours are within normal limits. Both lungs are clear. The visualized skeletal structures are unremarkable. IMPRESSION: No active cardiopulmonary disease. Electronically Signed   By: Lupita Raider M.D.   On: 07/27/2023 12:58     Procedures Procedures (including critical care time)  Medications Ordered in UC Medications - No data to display  Initial Impression / Assessment and Plan / UC Course  I have reviewed the triage vital signs and the nursing notes.  Pertinent labs & imaging results that were available during my care of the patient were reviewed by me and considered in my medical decision making (see chart for details).      Pt is a 88 y.o.  female with chronic shoulder pain persists for chest pain that radiates to her shoulder intermittently for the past few weeks.She is hypertensive. Satting well on room air.    Differential diagnosis includes, but is not limited to, ACS, aortic dissection, pulmonary embolism, cardiac tamponade, pneumothorax, pneumonia, pericarditis/myocarditis, GI-related causes including  esophagitis/gastritis, and musculoskeletal chest wall pain   Tyson has no history of PE.  Unable to [facility] pt out due to age however she has no dyspnea or tachycardia.  EKG obtained and is without ST elevations or concern for ischemia, NSR has new septal changes compared to previous EKGs.  CBC unremarkable for anemia.  There were no significant electrolyte abnormalitis seen on CMP.  Chest x-ray did not show bacterial pneumonia, rib fractures, pleural effusions or pneumothorax. Initial hsTrop was negative.  I do not think his chest pain is cardiopulmonary related. No GERD symptoms. History concerning for possible MSK injury due to carrying the heavy rain buckets.     Patient to gradually return to normal activities, as tolerated and continue ordinary activities within the limits permitted by pain. Continue Motrin and Tylenol PRN. Advised patient to avoid regular use of OTC NSAIDs. Counseled patient on red flag symptoms and when to seek immediate care.    Patient to follow up with her cardiologist and primary care provider.  Return and ED precautions given. Understanding voiced. Discussed MDM, treatment plan and plan for follow-up with patient who agrees with plan.   Final Clinical Impressions(s) / UC Diagnoses   Final diagnoses:  Chronic left shoulder pain  Atypical chest pain     Discharge Instructions      Follow up with your heart doctor and primary care doctor, as discussed.    Your chest xray did not show evidence of rib fracture, pneumonia or fluid in your lung. The radiologist has not yet read it, if they find something that I didn't, I will call you.        ED Prescriptions   None    PDMP not reviewed this encounter.   Katha Cabal, DO 07/29/23 [phone]

## 2023-07-27 NOTE — ED Triage Notes (Addendum)
Shoulder pain that's radiating to chest x 3 weeks. Left side. Patient states that she has had shoulder pain for over a year. Patient states that the pain-pressure started about 3 weeks ago. States that she feels it more when she bends over.

## 2023-08-29 ENCOUNTER — Other Ambulatory Visit: Payer: Self-pay | Admitting: Internal Medicine

## 2023-08-29 DIAGNOSIS — I10 Essential (primary) hypertension: Secondary | ICD-10-CM

## 2023-08-30 NOTE — Telephone Encounter (Signed)
Requested Prescriptions  Pending Prescriptions Disp Refills   olmesartan (BENICAR) 40 MG tablet [Pharmacy Med Name: OLMESARTAN MEDOXOMIL 40MG  TABLETS] 90 tablet 0    Sig: TAKE 1 TABLET(40 MG) BY MOUTH DAILY     Cardiovascular:  Angiotensin Receptor Blockers Failed - 08/30/2023 10:31 AM      Failed - Last BP in normal range    BP Readings from Last 1 Encounters:  07/27/23 (!) 163/80         Passed - Cr in normal range and within 180 days    Creatinine  Date Value Ref Range Status  01/22/2014 0.60 0.60 - 1.30 mg/dL Final   Creatinine, Ser  Date Value Ref Range Status  07/27/2023 0.83 0.44 - 1.00 mg/dL Final         Passed - K in normal range and within 180 days    Potassium  Date Value Ref Range Status  07/27/2023 4.1 3.5 - 5.1 mmol/L Final  01/22/2014 3.8 3.5 - 5.1 mmol/L Final         Passed - Patient is not pregnant      Passed - Valid encounter within last 6 months    Recent Outpatient Visits           3 months ago Essential (primary) hypertension   [place] [facility] at [facility], Nyoka Cowden, MD   5 months ago Leg heaviness   [place] [facility] at [facility], Nyoka Cowden, MD   7 months ago Essential (primary) hypertension   [place] [facility] at [facility], Nyoka Cowden, MD   10 months ago Benign esophageal stricture   [facility] at [facility] Nyoka Cowden, MD   11 months ago Annual physical exam   [facility] at [facility], Nyoka Cowden, MD       Future Appointments             In 3 months Judithann Graves, Nyoka Cowden, MD [facility] at [facility]   In 5 months Richardo Hanks, Laurette Schimke, MD [facility] [place]

## 2023-09-02 ENCOUNTER — Ambulatory Visit
Admission: EM | Admit: 2023-09-02 | Discharge: 2023-09-02 | Disposition: A | Discharge status: Home / Self Care | Payer: Medicare Other | Attending: Emergency Medicine | Admitting: Emergency Medicine

## 2023-09-02 ENCOUNTER — Encounter: Payer: Self-pay | Admitting: Emergency Medicine

## 2023-09-02 DIAGNOSIS — L84 Corns and callosities: Secondary | ICD-10-CM

## 2023-09-02 NOTE — ED Provider Notes (Signed)
HPI  SUBJECTIVE:  Kendra Lane is a 88 y.o. female who presents with painful areas between her right first and second toes starting about 3 weeks ago.  She describes the pain as throbbing, intermittent, worse with weightbearing and at night.  She has been taking Tylenol, soaking her toes and wrapping it with improvement in her symptoms.  She states the pain wakes her up from sleep.  No trauma, fevers, toe swelling, erythema, bruising, itching.  States that the rest of her foot is fine. Patient has a past medical history of GERD, hypertension, osteoporosis.  PCP, [facility].  [facility]iatry: [facility].  Past Medical History:  Diagnosis Date   Dysrhythmia    hx of, ok now   GERD (gastroesophageal reflux disease)    Headache    twice a day sometimes   Hypertension    Neck pain    Shoulder pain, left     Past Surgical History:  Procedure Laterality Date   ABDOMINAL HYSTERECTOMY     CATARACT EXTRACTION W/PHACO Right 07/23/2020   Procedure: CATARACT EXTRACTION PHACO AND INTRAOCULAR LENS PLACEMENT (IOC) RIGHT 7.62 01:26.5 8.8%;  Surgeon: Lockie Mola, MD;  Location: [facility];  Service: Ophthalmology;  Laterality: Right;   CATARACT EXTRACTION W/PHACO Left 04/01/2021   Procedure: CATARACT EXTRACTION PHACO AND INTRAOCULAR LENS PLACEMENT (IOC) LEFT 3.64 00:56.9;  Surgeon: Lockie Mola, MD;  Location: [facility];  Service: Ophthalmology;  Laterality: Left;   ESOPHAGOGASTRODUODENOSCOPY (EGD) WITH ESOPHAGEAL DILATION  10/2014   Dr. Servando Snare   TOTAL VAGINAL HYSTERECTOMY  1993   TUBAL LIGATION      Family History  Problem Relation Age of Onset   Hypertension Mother    Other Father        "old age"   Breast cancer Daughter 21    Social History   Tobacco Use   Smoking status: Never    Passive exposure: Never   Smokeless tobacco: Never   Tobacco comments:    smoking cessation materials not required  Vaping Use   Vaping status: Never Used   Substance Use Topics   Alcohol use: Not Currently    Alcohol/week: 2.0 standard drinks of alcohol    Types: 2 Cans of beer per week    Comment: last use 08/2019   Drug use: No    No current facility-administered medications for this encounter.  Current Outpatient Medications:    amLODipine (NORVASC) 10 MG tablet, TAKE 1 TABLET BY MOUTH DAILY, Disp: 90 tablet, Rfl: 3   aspirin 81 MG tablet, Take 81 mg by mouth daily., Disp: , Rfl:    calcium-vitamin D (OSCAL WITH D) 500-200 MG-UNIT tablet, Take 1 tablet by mouth. 600 mg daily, Disp: , Rfl:    diclofenac Sodium (VOLTAREN) 1 % GEL, Apply 2 g topically 4 (four) times daily., Disp: 50 g, Rfl: 0   metoprolol succinate (TOPROL-XL) 25 MG 24 hr tablet, TAKE 3 TABLETS(75 MG) BY MOUTH DAILY, Disp: 270 tablet, Rfl: 3   Multiple Vitamins-Minerals (CENTRUM SILVER 50+WOMEN PO), Take 1 tablet by mouth daily., Disp: , Rfl:    mupirocin ointment (BACTROBAN) 2 %, Apply 1 Application topically 2 (two) times daily., Disp: 22 g, Rfl: 0   olmesartan (BENICAR) 40 MG tablet, TAKE 1 TABLET(40 MG) BY MOUTH DAILY, Disp: 90 tablet, Rfl: 0   omeprazole (PRILOSEC) 20 MG capsule, TAKE 1 CAPSULE(20 MG) BY MOUTH DAILY, Disp: 90 capsule, Rfl: 3   spironolactone (ALDACTONE) 25 MG tablet, TAKE 1 TABLET(25 MG) BY MOUTH  DAILY, Disp: 90 tablet, Rfl: 3   traMADol (ULTRAM) 50 MG tablet, Take 1 tablet (50 mg total) by mouth every 12 (twelve) hours as needed., Disp: 10 tablet, Rfl: 0  Allergies  Allergen Reactions   Valsartan Other (See Comments)    dizziness     ROS  As noted in HPI.   Physical Exam  BP (!) 167/87 (BP Location: Left Arm)   Pulse 88   Temp 98.5 F (36.9 C) (Oral)   Resp 14   Ht 5\' 5"  (1.651 m)   Wt 55 kg   SpO2 96%   BMI 20.18 kg/m   Constitutional: Well developed, well nourished, no acute distress Eyes:  EOMI, conjunctiva normal bilaterally HENT: Normocephalic, atraumatic,mucus membranes moist Respiratory: Normal inspiratory  effort Cardiovascular: Normal rate GI: nondistended skin: No rash, skin intact Musculoskeletal: Right foot: Bunion first toe.  Hammertoe second toe.  Tender opposing calluses on the first and second toe with no evidence of infection.  Skin intact.  Toes nontender.  Cap refill less than 2 seconds.  Sensation grossly intact.  Patient able to move toes without any issues.  No swelling, bruising.  The rest of the foot nontender, normal.  DP 2+.     Neurologic: Alert & oriented x 3, no focal neuro deficits Psychiatric: Speech and behavior appropriate   ED Course   Medications - No data to display  No orders of the defined types were placed in this encounter.   No results found for this or any previous visit (from the past 24 hours). No results found.  ED Clinical Impression  1. Callus between toes      ED Assessment/Plan     These appear to be painful calluses.  No evidence of infection.  doubt fracture, deferring imaging.  Advised patient to continue soaking them, wrapping with gel cushioning pads or she can try corn pads.  Follow-up with the [facility] if not better in a week or 2.  Discussed   MDM, treatment plan, and plan for follow-up with patient. Discussed sn/sx that should prompt return to the ED. patient agrees with plan.   No orders of the defined types were placed in this encounter.     *This clinic note was created using Dragon dictation software. Therefore, there may be occasional mistakes despite careful proofreading.  ?    Domenick Gong, MD 09/02/23 1044

## 2023-09-02 NOTE — ED Triage Notes (Signed)
Patient reports ongoing pain in her right 1st and 2nd toe for about 1-2 weeks.  Patient denies injury.

## 2023-09-02 NOTE — Discharge Instructions (Signed)
Try corn or callus pads.  There does not appear to be any infection today.  Continue soaking, wrapping it to protect the skin.  Follow-up with the [facility] clinic if not better in a week.

## 2023-11-07 ENCOUNTER — Other Ambulatory Visit: Payer: Self-pay | Admitting: Internal Medicine

## 2023-11-07 DIAGNOSIS — Z1231 Encounter for screening mammogram for malignant neoplasm of breast: Secondary | ICD-10-CM

## 2023-11-28 DIAGNOSIS — H40003 Preglaucoma, unspecified, bilateral: Secondary | ICD-10-CM | POA: Diagnosis not present

## 2023-11-28 DIAGNOSIS — H26492 Other secondary cataract, left eye: Secondary | ICD-10-CM | POA: Diagnosis not present

## 2023-11-28 DIAGNOSIS — H26491 Other secondary cataract, right eye: Secondary | ICD-10-CM | POA: Diagnosis not present

## 2023-11-28 DIAGNOSIS — Z961 Presence of intraocular lens: Secondary | ICD-10-CM | POA: Diagnosis not present

## 2023-11-29 ENCOUNTER — Other Ambulatory Visit: Payer: Self-pay | Admitting: Internal Medicine

## 2023-11-29 ENCOUNTER — Ambulatory Visit (INDEPENDENT_AMBULATORY_CARE_PROVIDER_SITE_OTHER): Payer: Medicare Other | Admitting: Internal Medicine

## 2023-11-29 ENCOUNTER — Encounter: Payer: Self-pay | Admitting: Internal Medicine

## 2023-11-29 VITALS — BP 130/70 | HR 94 | Ht 65.0 in | Wt 124.0 lb

## 2023-11-29 DIAGNOSIS — I10 Essential (primary) hypertension: Secondary | ICD-10-CM | POA: Diagnosis not present

## 2023-11-29 DIAGNOSIS — M19019 Primary osteoarthritis, unspecified shoulder: Secondary | ICD-10-CM

## 2023-11-29 DIAGNOSIS — E782 Mixed hyperlipidemia: Secondary | ICD-10-CM

## 2023-11-29 DIAGNOSIS — R7303 Prediabetes: Secondary | ICD-10-CM

## 2023-11-29 DIAGNOSIS — K222 Esophageal obstruction: Secondary | ICD-10-CM | POA: Diagnosis not present

## 2023-11-29 NOTE — Assessment & Plan Note (Addendum)
 Controlled BP with normal exam. Current regimen is Benicar, metoprolol, spironolactone and amlodipine. Will continue same medications; encourage continued reduced sodium diet.

## 2023-11-29 NOTE — Assessment & Plan Note (Signed)
 Continue healthy diet Lab Results  Component Value Date   HGBA1C 5.8 (H) 09/16/2022

## 2023-11-29 NOTE — Progress Notes (Signed)
 Date:  11/29/2023   Name:  Kendra Lane   DOB:  Oct 20, 1934   MRN:  [id number]   Chief Complaint: Hypertension  Hypertension This is a chronic problem. The problem is controlled. Pertinent negatives include no blurred vision, chest pain, orthopnea, peripheral edema or shortness of breath. Past treatments include angiotensin blockers, calcium channel blockers, beta blockers and diuretics. The current treatment provides significant improvement. There are no compliance problems.  There is no history of kidney disease, CAD/MI or CVA.    Review of Systems  Constitutional:  Negative for appetite change, chills, fever and unexpected weight change.  HENT:  Negative for trouble swallowing.   Eyes:  Negative for blurred vision.  Respiratory:  Negative for chest tightness, shortness of breath and wheezing.   Cardiovascular:  Negative for chest pain and orthopnea.  Musculoskeletal:  Positive for arthralgias (left shoulder OA).  Psychiatric/Behavioral:  Negative for dysphoric mood and sleep disturbance. The patient is not nervous/anxious.      Lab Results  Component Value Date   NA 139 07/27/2023   K 4.1 07/27/2023   CO2 25 07/27/2023   GLUCOSE 110 (H) 07/27/2023   BUN 15 07/27/2023   CREATININE 0.83 07/27/2023   CALCIUM 9.6 07/27/2023   EGFR 83 07/18/2022   GFRNONAA >60 07/27/2023   Lab Results  Component Value Date   CHOL 215 (H) 09/16/2022   HDL 80 09/16/2022   LDLCALC 120 (H) 09/16/2022   TRIG 74 09/16/2022   CHOLHDL 2.7 09/16/2022   Lab Results  Component Value Date   TSH 1.90 07/18/2022   Lab Results  Component Value Date   HGBA1C 5.8 (H) 09/16/2022   Lab Results  Component Value Date   WBC 4.4 07/27/2023   HGB 13.3 07/27/2023   HCT 39.4 07/27/2023   MCV 91.4 07/27/2023   PLT 323 07/27/2023   Lab Results  Component Value Date   ALT 13 07/27/2023   AST 18 07/27/2023   ALKPHOS 35 (L) 07/27/2023   BILITOT 0.8 07/27/2023   Lab Results  Component Value  Date   VD25OH 36.4 06/16/2021     Patient Active Problem List   Diagnosis Date Noted   Leg heaviness 06/01/2023   Mixed hyperlipidemia 09/16/2022   Prediabetes 09/16/2022   Asymptomatic microscopic hematuria 09/16/2022   Age-related osteoporosis without current pathological fracture 06/12/2020   Aortic atherosclerosis (HCC) 04/02/2020   Arthritis of shoulder 11/30/2017   Bigeminy 04/07/2016   Benign esophageal stricture 01/21/2015   Essential (primary) hypertension 01/21/2015   Hammer toe 01/21/2015   Lipoma of skin and subcutaneous tissue of trunk 01/21/2015    Allergies  Allergen Reactions   Valsartan Other (See Comments)    dizziness    Past Surgical History:  Procedure Laterality Date   ABDOMINAL HYSTERECTOMY     CATARACT EXTRACTION W/PHACO Right 07/23/2020   Procedure: CATARACT EXTRACTION PHACO AND INTRAOCULAR LENS PLACEMENT (IOC) RIGHT 7.62 01:26.5 8.8%;  Surgeon: Lockie Mola, MD;  Location: [facility];  Service: Ophthalmology;  Laterality: Right;   CATARACT EXTRACTION W/PHACO Left 04/01/2021   Procedure: CATARACT EXTRACTION PHACO AND INTRAOCULAR LENS PLACEMENT (IOC) LEFT 3.64 00:56.9;  Surgeon: Lockie Mola, MD;  Location: [facility];  Service: Ophthalmology;  Laterality: Left;   ESOPHAGOGASTRODUODENOSCOPY (EGD) WITH ESOPHAGEAL DILATION  10/2014   Dr. Servando Snare   TOTAL VAGINAL HYSTERECTOMY  1993   TUBAL LIGATION      Social History   Tobacco Use   Smoking status: Never    Passive  exposure: Never   Smokeless tobacco: Never   Tobacco comments:    smoking cessation materials not required  Vaping Use   Vaping status: Never Used  Substance Use Topics   Alcohol use: Not Currently    Alcohol/week: 2.0 standard drinks of alcohol    Types: 2 Cans of beer per week    Comment: last use 08/2019   Drug use: No     Medication list has been reviewed and updated.  Current Meds  Medication Sig   amLODipine (NORVASC) 10 MG tablet TAKE 1  TABLET BY MOUTH DAILY   aspirin 81 MG tablet Take 81 mg by mouth daily.   calcium-vitamin D (OSCAL WITH D) 500-200 MG-UNIT tablet Take 1 tablet by mouth. 600 mg daily   metoprolol succinate (TOPROL-XL) 25 MG 24 hr tablet TAKE 3 TABLETS(75 MG) BY MOUTH DAILY   Multiple Vitamins-Minerals (CENTRUM SILVER 50+WOMEN PO) Take 1 tablet by mouth daily.   olmesartan (BENICAR) 40 MG tablet TAKE 1 TABLET(40 MG) BY MOUTH DAILY   omeprazole (PRILOSEC) 20 MG capsule TAKE 1 CAPSULE(20 MG) BY MOUTH DAILY   spironolactone (ALDACTONE) 25 MG tablet TAKE 1 TABLET(25 MG) BY MOUTH DAILY       11/29/2023    9:54 AM 06/01/2023    2:23 PM 03/30/2023    9:55 AM 01/17/2023    9:41 AM  GAD 7 : Generalized Anxiety Score  Nervous, Anxious, on Edge 2 2 0 2  Control/stop worrying 2 2 0 2  Worry too much - different things 2 2 0 3  Trouble relaxing 0 0 0 0  Restless 1 1 0 2  Easily annoyed or irritable 2 2 0 2  Afraid - awful might happen 0 1 0 1  Total GAD 7 Score 9 10 0 12  Anxiety Difficulty Not difficult at all Somewhat difficult Not difficult at all Somewhat difficult       11/29/2023    9:53 AM 06/15/2023    2:10 PM 06/01/2023    2:23 PM  Depression screen PHQ 2/9  Decreased Interest 0 0 1  Down, Depressed, Hopeless 1 1 1   PHQ - 2 Score 1 1 2   Altered sleeping 1 1 1   Tired, decreased energy 2 1 2   Change in appetite 0 0 0  Feeling bad or failure about yourself  0 0 0  Trouble concentrating 1 0 2  Moving slowly or fidgety/restless 1 0 2  Suicidal thoughts 0 0 0  PHQ-9 Score 6 3 9   Difficult doing work/chores Somewhat difficult Not difficult at all Not difficult at all    BP Readings from Last 3 Encounters:  11/29/23 130/70  09/02/23 (!) 167/87  07/27/23 (!) 163/80    Physical Exam Vitals and nursing note reviewed.  Constitutional:      General: She is not in acute distress.    Appearance: Normal appearance. She is well-developed.  HENT:     Head: Normocephalic and atraumatic.  Cardiovascular:      Rate and Rhythm: Normal rate and regular rhythm. Frequent Extrasystoles (with documented bigeminy on EKG) are present.    Heart sounds: No murmur heard. Pulmonary:     Effort: Pulmonary effort is normal. No respiratory distress.     Breath sounds: No wheezing or rhonchi.  Musculoskeletal:     Left shoulder: Bony tenderness present. No swelling or crepitus. Decreased range of motion.     Cervical back: Normal range of motion.     Right lower leg: No edema.  Left lower leg: No edema.  Lymphadenopathy:     Cervical: No cervical adenopathy.  Skin:    General: Skin is warm and dry.     Findings: No rash.  Neurological:     Mental Status: She is alert and oriented to person, place, and time.  Psychiatric:        Mood and Affect: Mood normal.        Behavior: Behavior normal.     Wt Readings from Last 3 Encounters:  11/29/23 124 lb (56.2 kg)  09/02/23 121 lb 4.1 oz (55 kg)  06/15/23 121 lb 3.2 oz (55 kg)    BP 130/70   Pulse 94   Ht 5\' 5"  (1.651 m)   Wt 124 lb (56.2 kg)   SpO2 100%   BMI 20.63 kg/m   Assessment and Plan:  Problem List Items Addressed This Visit       Unprioritized   Benign esophageal stricture (Chronic)   Reflux symptoms are minimal on current therapy - omeprazole. Appetite is good, no dysphagia No red flag signs such as weight loss, n/v, melena       Essential (primary) hypertension - Primary (Chronic)   Controlled BP with normal exam. Current regimen is Benicar, metoprolol, spironolactone and amlodipine. Will continue same medications; encourage continued reduced sodium diet.       Arthritis of shoulder   Minimal pain at rest. Not currently on any therapy. Consider Ortho or SM consult if worsening      Mixed hyperlipidemia (Chronic)   Managed with diet only.      Prediabetes (Chronic)   Continue healthy diet Lab Results  Component Value Date   HGBA1C 5.8 (H) 09/16/2022          Return in about 4 months (around  03/30/2024) for CPX.    Reubin Milan, MD [facility] [place]

## 2023-11-29 NOTE — Assessment & Plan Note (Signed)
 Reflux symptoms are minimal on current therapy - omeprazole. Appetite is good, no dysphagia No red flag signs such as weight loss, n/v, melena

## 2023-11-29 NOTE — Assessment & Plan Note (Signed)
Managed with diet only.

## 2023-11-29 NOTE — Assessment & Plan Note (Signed)
 Minimal pain at rest. Not currently on any therapy. Consider Ortho or SM consult if worsening

## 2023-11-30 NOTE — Telephone Encounter (Signed)
 Requested Prescriptions  Pending Prescriptions Disp Refills   olmesartan (BENICAR) 40 MG tablet [Pharmacy Med Name: OLMESARTAN MEDOXOMIL 40MG  TABLETS] 90 tablet 0    Sig: TAKE 1 TABLET(40 MG) BY MOUTH DAILY     Cardiovascular:  Angiotensin Receptor Blockers Passed - 11/30/2023  1:14 PM      Passed - Cr in normal range and within 180 days    Creatinine  Date Value Ref Range Status  01/22/2014 0.60 0.60 - 1.30 mg/dL Final   Creatinine, Ser  Date Value Ref Range Status  07/27/2023 0.83 0.44 - 1.00 mg/dL Final         Passed - K in normal range and within 180 days    Potassium  Date Value Ref Range Status  07/27/2023 4.1 3.5 - 5.1 mmol/L Final  01/22/2014 3.8 3.5 - 5.1 mmol/L Final         Passed - Patient is not pregnant      Passed - Last BP in normal range    BP Readings from Last 1 Encounters:  11/29/23 130/70         Passed - Valid encounter within last 6 months    Recent Outpatient Visits           6 months ago Essential (primary) hypertension   [place] [facility] at [facility] Nyoka Cowden, MD   8 months ago Leg heaviness   [place] [facility] at [facility], Nyoka Cowden, MD   10 months ago Essential (primary) hypertension   [place] [facility] at [facility], Nyoka Cowden, MD   1 year ago Benign esophageal stricture   [facility] at [facility], Nyoka Cowden, MD   1 year ago Annual physical exam   [facility] at [facility], Nyoka Cowden, MD       Future Appointments             In 2 months Richardo Hanks, Laurette Schimke, MD [facility] [place]   In 4 months Judithann Graves Nyoka Cowden, MD [facility] Medicine at [facility]

## 2023-12-05 ENCOUNTER — Ambulatory Visit
Admission: RE | Admit: 2023-12-05 | Discharge: 2023-12-05 | Disposition: A | Discharge status: Home / Self Care | Payer: Medicare Other | Source: Ambulatory Visit | Attending: Internal Medicine | Admitting: Internal Medicine

## 2023-12-05 DIAGNOSIS — Z1231 Encounter for screening mammogram for malignant neoplasm of breast: Secondary | ICD-10-CM | POA: Insufficient documentation

## 2023-12-19 DIAGNOSIS — H26492 Other secondary cataract, left eye: Secondary | ICD-10-CM | POA: Diagnosis not present

## 2024-01-02 ENCOUNTER — Other Ambulatory Visit: Payer: Self-pay | Admitting: Internal Medicine

## 2024-01-02 NOTE — Telephone Encounter (Signed)
 Requested Prescriptions  Pending Prescriptions Disp Refills   metoprolol  succinate (TOPROL -XL) 25 MG 24 hr tablet [Pharmacy Med Name: METOPROLOL  ER SUCCINATE 25MG  TABS] 270 tablet 1    Sig: TAKE 3 TABLETS(75 MG) BY MOUTH DAILY     Cardiovascular:  Beta Blockers Passed - 01/02/2024  5:13 PM      Passed - Last BP in normal range    BP Readings from Last 1 Encounters:  11/29/23 130/70         Passed - Last Heart Rate in normal range    Pulse Readings from Last 1 Encounters:  11/29/23 94         Passed - Valid encounter within last 6 months    Recent Outpatient Visits           1 month ago Essential (primary) hypertension   [place] [facility] at [facility], Chales Colorado, MD       Future Appointments             In 1 month Estanislao Heimlich, Dennard Fisher, MD [facility] [place]   In 3 months Gala Jubilee Chales [place], MD [facility] Primary Care & Sports Medicine at [facility], [facility]

## 2024-01-23 ENCOUNTER — Ambulatory Visit
Admission: EM | Admit: 2024-01-23 | Discharge: 2024-01-23 | Disposition: A | Discharge status: Home / Self Care | Attending: Family Medicine | Admitting: Family Medicine

## 2024-01-23 DIAGNOSIS — B351 Tinea unguium: Secondary | ICD-10-CM | POA: Diagnosis not present

## 2024-01-23 DIAGNOSIS — L84 Corns and callosities: Secondary | ICD-10-CM | POA: Diagnosis not present

## 2024-01-23 MED ORDER — LIDOCAINE 3.5 % EX LOTN: 1.0000 | TOPICAL_LOTION | Freq: Four times a day (QID) | CUTANEOUS | 0 refills | Status: DC | PRN

## 2024-01-23 NOTE — Discharge Instructions (Addendum)
 Follow up up with Dr Althea Atkinson who can file your toenails down and help with your calluses.    Gently massage the lidocaine  (numbing) lotion into the skin of her toes and feet before bed to help with the foot pain. Take Tylenol  1000 mg 3 times a day as needed for pain.

## 2024-01-23 NOTE — ED Provider Notes (Signed)
 [facility]    CSN: [id number] Arrival date & time: 01/23/24  0905      History   Chief Complaint Chief Complaint  Patient presents with   Nail Problem    HPI  HPI Kendra Lane is a 89 y.o. female.   Kendra Lane presents for right foot pain with known calloses.  She has not seen her podiatrist in a while. Taking nothing for pain as it occurs mostly at night and is preventing her from sleeping well. Does have pain with walking. Tried putting cotton between her toes but this is not helping. There is pain on the bottom of her feet too.  Requests something for her foot pain.         Past Medical History:  Diagnosis Date   Dysrhythmia    hx of, ok now   GERD (gastroesophageal reflux disease)    Headache    twice a day sometimes   Hypertension    Neck pain    Shoulder pain, left     Patient Active Problem List   Diagnosis Date Noted   Leg heaviness 06/01/2023   Mixed hyperlipidemia 09/16/2022   Prediabetes 09/16/2022   Asymptomatic microscopic hematuria 09/16/2022   Age-related osteoporosis without current pathological fracture 06/12/2020   Aortic atherosclerosis (HCC) 04/02/2020   Arthritis of shoulder 11/30/2017   Bigeminy 04/07/2016   Benign esophageal stricture 01/21/2015   Essential (primary) hypertension 01/21/2015   Hammer toe 01/21/2015   Lipoma of skin and subcutaneous tissue of trunk 01/21/2015    Past Surgical History:  Procedure Laterality Date   ABDOMINAL HYSTERECTOMY     CATARACT EXTRACTION W/PHACO Right 07/23/2020   Procedure: CATARACT EXTRACTION PHACO AND INTRAOCULAR LENS PLACEMENT (IOC) RIGHT 7.62 01:26.5 8.8%;  Surgeon: Annell Kidney, MD;  Location: [facility];  Service: Ophthalmology;  Laterality: Right;   CATARACT EXTRACTION W/PHACO Left 04/01/2021   Procedure: CATARACT EXTRACTION PHACO AND INTRAOCULAR LENS PLACEMENT (IOC) LEFT 3.64 00:56.9;  Surgeon: Annell Kidney, MD;  Location: [facility];   Service: Ophthalmology;  Laterality: Left;   ESOPHAGOGASTRODUODENOSCOPY (EGD) WITH ESOPHAGEAL DILATION  10/2014   Dr. Ole Berkeley   TOTAL VAGINAL HYSTERECTOMY  1993   TUBAL LIGATION      OB History   No obstetric history on file.      Home Medications    Prior to Admission medications   Medication Sig Start Date End Date Taking? Authorizing Provider  amLODipine  (NORVASC ) 10 MG tablet TAKE 1 TABLET BY MOUTH DAILY 06/24/23  Yes Sheron Dixons, MD  aspirin  81 MG tablet Take 81 mg by mouth daily.   Yes [provider]  calcium-vitamin D  (OSCAL WITH D) 500-200 MG-UNIT tablet Take 1 tablet by mouth. 600 mg daily   Yes [provider]  metoprolol  succinate (TOPROL -XL) 25 MG 24 hr tablet TAKE 3 TABLETS(75 MG) BY MOUTH DAILY 01/02/24  Yes Berglund, Laura H, MD  Multiple Vitamins-Minerals (CENTRUM SILVER 50+WOMEN PO) Take 1 tablet by mouth daily.   Yes [provider]  olmesartan  (BENICAR ) 40 MG tablet TAKE 1 TABLET(40 MG) BY MOUTH DAILY 11/30/23  Yes Sheron Dixons, MD  omeprazole  (PRILOSEC) 20 MG capsule TAKE 1 CAPSULE(20 MG) BY MOUTH DAILY 06/24/23  Yes Sheron Dixons, MD  spironolactone  (ALDACTONE ) 25 MG tablet TAKE 1 TABLET(25 MG) BY MOUTH DAILY 05/23/23  Yes Sheron Dixons, MD  Lidocaine  3.5 % LOTN Apply 1 application  topically every 6 (six) hours as needed. 01/23/24  Yes Mihcael Ledee, DO  Family History Family History  Problem Relation Age of Onset   Hypertension Mother    Other Father        "old age"   Breast cancer Daughter 79    Social History Social History   Tobacco Use   Smoking status: Never    Passive exposure: Never   Smokeless tobacco: Never   Tobacco comments:    smoking cessation materials not required  Vaping Use   Vaping status: Never Used  Substance Use Topics   Alcohol use: Not Currently    Alcohol/week: 2.0 standard drinks of alcohol    Types: 2 Cans of beer per week    Comment: last use 08/2019   Drug use: No      Allergies   Valsartan   Review of Systems Review of Systems: :negative unless otherwise stated in HPI.      Physical Exam Triage Vital Signs ED Triage Vitals  Encounter Vitals Group     BP      Systolic BP Percentile      Diastolic BP Percentile      Pulse      Resp      Temp      Temp src      SpO2      Weight      Height      Head Circumference      Peak Flow      Pain Score      Pain Loc      Pain Education      Exclude from Growth Chart    No data found.  Updated Vital Signs BP (!) 157/80 (BP Location: Left Arm)   Pulse 74   Temp 98.9 F (37.2 C) (Oral)   Resp 16   Ht 5\' 6"  (1.676 m)   Wt 55.8 kg   SpO2 98%   BMI 19.85 kg/m   Visual Acuity Right Eye Distance:   Left Eye Distance:   Bilateral Distance:    Right Eye Near:   Left Eye Near:    Bilateral Near:     Physical Exam GEN: well appearing female in no acute distress  CVS: well perfused  RESP: speaking in full sentences without pause, no respiratory distress  MSK: good ROM of right foot, hammer toes present SKIN: multiple calloses between and underneath toes, plantar foot pad callous is TTP, onychomycosis    UC Treatments / Results  Labs (all labs ordered are listed, but only abnormal results are displayed) Labs Reviewed - No data to display  EKG   Radiology No results found.   Procedures Procedures (including critical care time)  Medications Ordered in UC Medications - No data to display  Initial Impression / Assessment and Plan / UC Course  I have reviewed the triage vital signs and the nursing notes.  Pertinent labs & imaging results that were available during my care of the patient were reviewed by me and considered in my medical decision making (see chart for details).      Pt is a 89 y.o.  female with acute right foot and toe pain. Imaging deferred. Doubt acute fracture or dislocation. On exam, she has multiple foot and toe calloses present that are TTP.   Patient to gradually return to normal activities, as tolerated and continue ordinary activities within the limits permitted by pain. Prescribed Lidocaine  lotion for pain relief.  Tylenol  PRN.   Patient to follow up with her podiatrist,  if symptoms do not improve  with conservative treatment.  Return and ED precautions given. Understanding voiced. Discussed MDM, treatment plan and plan for follow-up with patient who agrees with plan.   Final Clinical Impressions(s) / UC Diagnoses   Final diagnoses:  Foot callus  Callus of toe  Onychomycosis     Discharge Instructions      Follow up up with Dr Althea Atkinson who can file your toenails down and help with your calluses.    Gently massage the lidocaine  (numbing) lotion into the skin of her toes and feet before bed to help with the foot pain. Take Tylenol  1000 mg 3 times a day as needed for pain.    ED Prescriptions     Medication Sig Dispense Auth. Provider   Lidocaine  3.5 % LOTN Apply 1 application  topically every 6 (six) hours as needed. 120 g Arnold Depinto, DO      PDMP not reviewed this encounter.   Fidel Huddle, DO 01/23/24 1043

## 2024-01-23 NOTE — ED Triage Notes (Signed)
 Pt c/o toenail fungus ongoing for awhile. Has tried OTC meds w/o relief. Denies any pain.

## 2024-02-13 ENCOUNTER — Other Ambulatory Visit: Payer: Self-pay

## 2024-02-13 DIAGNOSIS — N200 Calculus of kidney: Secondary | ICD-10-CM

## 2024-02-14 ENCOUNTER — Ambulatory Visit
Admission: RE | Admit: 2024-02-14 | Discharge: 2024-02-14 | Disposition: A | Discharge status: Home / Self Care | Source: Ambulatory Visit | Attending: Urology

## 2024-02-14 ENCOUNTER — Ambulatory Visit
Admission: RE | Admit: 2024-02-14 | Discharge: 2024-02-14 | Disposition: A | Discharge status: Home / Self Care | Attending: Urology | Admitting: Urology

## 2024-02-14 ENCOUNTER — Ambulatory Visit (INDEPENDENT_AMBULATORY_CARE_PROVIDER_SITE_OTHER): Payer: Self-pay | Admitting: Urology

## 2024-02-14 VITALS — BP 170/78 | HR 83 | Ht 66.0 in | Wt 124.4 lb

## 2024-02-14 DIAGNOSIS — Z87442 Personal history of urinary calculi: Secondary | ICD-10-CM | POA: Diagnosis not present

## 2024-02-14 DIAGNOSIS — N200 Calculus of kidney: Secondary | ICD-10-CM

## 2024-02-14 DIAGNOSIS — I878 Other specified disorders of veins: Secondary | ICD-10-CM | POA: Diagnosis not present

## 2024-02-14 NOTE — Patient Instructions (Signed)
 Kendra Lane

## 2024-02-14 NOTE — Progress Notes (Signed)
   02/14/2024 10:30 AM   Kendra Lane 1935-07-14 [id number]  Reason for visit: Follow up nephrolithiasis, history of microscopic hematuria  HPI: 89 year old female I saw in May 2024 for microscopic hematuria.  Cystoscopy was benign, CT showed a nonobstructive left midpole renal stone and lower pole stone.  She opted for surveillance.  She denies any problems over the last year, specifically no flank pain, gross hematuria, or UTIs.  I personally viewed and interpreted the KUB today that shows a stable left midpole stone and lower pole stones.  She would like to continue surveillance which is very reasonable.  RTC 1 year KUB prior, if stable likely can follow-up with urology as needed   Lawerence Pressman, MD  [facility] [address], [address] [place], [place] [address] [phone]

## 2024-03-04 ENCOUNTER — Other Ambulatory Visit: Payer: Self-pay | Admitting: Internal Medicine

## 2024-03-04 DIAGNOSIS — I10 Essential (primary) hypertension: Secondary | ICD-10-CM

## 2024-03-06 NOTE — Telephone Encounter (Signed)
 Requested medication (s) are due for refill today: yes   Requested medication (s) are on the active medication list: yes   Last refill:  11/30/23 # 90 0 refills   Future visit scheduled: yes in 3 weeks   Notes to clinic:  protocol failed last labs 07/27/23. Do you want to refill Rx?     Requested Prescriptions  Pending Prescriptions Disp Refills   olmesartan  (BENICAR ) 40 MG tablet [Pharmacy Med Name: OLMESARTAN  MEDOXOMIL 40MG  TABLETS] 90 tablet 0    Sig: TAKE 1 TABLET(40 MG) BY MOUTH DAILY     Cardiovascular:  Angiotensin Receptor Blockers Failed - 03/06/2024  2:32 PM      Failed - Cr in normal range and within 180 days    Creatinine  Date Value Ref Range Status  01/22/2014 0.60 0.60 - 1.30 mg/dL Final   Creatinine, Ser  Date Value Ref Range Status  07/27/2023 0.83 0.44 - 1.00 mg/dL Final         Failed - K in normal range and within 180 days    Potassium  Date Value Ref Range Status  07/27/2023 4.1 3.5 - 5.1 mmol/L Final  01/22/2014 3.8 3.5 - 5.1 mmol/L Final         Failed - Last BP in normal range    BP Readings from Last 1 Encounters:  02/14/24 (!) 170/78         Passed - Patient is not pregnant      Passed - Valid encounter within last 6 months    Recent Outpatient Visits           3 months ago Essential (primary) hypertension   [place] [facility] at [facility], Leita DEL, MD       Future Appointments             In 3 weeks Justus Leita DEL, MD [facility] Medicine at [facility]   In 11 months Francisca Redell BROCKS, MD [facility] [place]

## 2024-03-12 DIAGNOSIS — M2011 Hallux valgus (acquired), right foot: Secondary | ICD-10-CM | POA: Diagnosis not present

## 2024-03-12 DIAGNOSIS — M2041 Other hammer toe(s) (acquired), right foot: Secondary | ICD-10-CM | POA: Diagnosis not present

## 2024-04-02 ENCOUNTER — Ambulatory Visit: Payer: Self-pay | Admitting: Internal Medicine

## 2024-04-02 ENCOUNTER — Other Ambulatory Visit
Admission: RE | Admit: 2024-04-02 | Discharge: 2024-04-02 | Disposition: A | Discharge status: Home / Self Care | Attending: Internal Medicine | Admitting: Internal Medicine

## 2024-04-02 ENCOUNTER — Ambulatory Visit (INDEPENDENT_AMBULATORY_CARE_PROVIDER_SITE_OTHER): Admitting: Internal Medicine

## 2024-04-02 ENCOUNTER — Encounter: Payer: Self-pay | Admitting: Internal Medicine

## 2024-04-02 VITALS — BP 140/78 | HR 92 | Ht 66.0 in | Wt 124.0 lb

## 2024-04-02 DIAGNOSIS — E782 Mixed hyperlipidemia: Secondary | ICD-10-CM

## 2024-04-02 DIAGNOSIS — K222 Esophageal obstruction: Secondary | ICD-10-CM | POA: Diagnosis not present

## 2024-04-02 DIAGNOSIS — R7303 Prediabetes: Secondary | ICD-10-CM

## 2024-04-02 DIAGNOSIS — I10 Essential (primary) hypertension: Secondary | ICD-10-CM | POA: Diagnosis not present

## 2024-04-02 DIAGNOSIS — Z0000 Encounter for general adult medical examination without abnormal findings: Secondary | ICD-10-CM | POA: Diagnosis not present

## 2024-04-02 DIAGNOSIS — Z1331 Encounter for screening for depression: Secondary | ICD-10-CM

## 2024-04-02 LAB — CBC WITH DIFFERENTIAL/PLATELET
Abs Immature Granulocytes: 0.01 K/uL (ref 0.00–0.07)
Basophils Absolute: 0.1 K/uL (ref 0.0–0.1)
Basophils Relative: 1 %
Eosinophils Absolute: 0 K/uL (ref 0.0–0.5)
Eosinophils Relative: 1 %
HCT: 35.5 % — ABNORMAL LOW (ref 36.0–46.0)
Hemoglobin: 11.8 g/dL — ABNORMAL LOW (ref 12.0–15.0)
Immature Granulocytes: 0 %
Lymphocytes Relative: 30 %
Lymphs Abs: 1.8 K/uL (ref 0.7–4.0)
MCH: 30.4 pg (ref 26.0–34.0)
MCHC: 33.2 g/dL (ref 30.0–36.0)
MCV: 91.5 fL (ref 80.0–100.0)
Monocytes Absolute: 0.6 K/uL (ref 0.1–1.0)
Monocytes Relative: 10 %
Neutro Abs: 3.4 K/uL (ref 1.7–7.7)
Neutrophils Relative %: 58 %
Platelets: 297 K/uL (ref 150–400)
RBC: 3.88 MIL/uL (ref 3.87–5.11)
RDW: 13.2 % (ref 11.5–15.5)
WBC: 5.9 K/uL (ref 4.0–10.5)
nRBC: 0 % (ref 0.0–0.2)

## 2024-04-02 LAB — COMPREHENSIVE METABOLIC PANEL WITH GFR
ALT: 16 U/L (ref 0–44)
AST: 20 U/L (ref 15–41)
Albumin: 4.2 g/dL (ref 3.5–5.0)
Alkaline Phosphatase: 34 U/L — ABNORMAL LOW (ref 38–126)
Anion gap: 9 (ref 5–15)
BUN: 14 mg/dL (ref 8–23)
CO2: 28 mmol/L (ref 22–32)
Calcium: 9.6 mg/dL (ref 8.9–10.3)
Chloride: 103 mmol/L (ref 98–111)
Creatinine, Ser: 0.79 mg/dL (ref 0.44–1.00)
GFR, Estimated: >60 mL/min (ref >60)
Glucose, Bld: 101 mg/dL — ABNORMAL HIGH (ref 70–99)
Potassium: 4.3 mmol/L (ref 3.5–5.1)
Sodium: 140 mmol/L (ref 135–145)
Total Bilirubin: 0.7 mg/dL (ref 0.0–1.2)
Total Protein: 7.2 g/dL (ref 6.5–8.1)

## 2024-04-02 LAB — URINALYSIS, ROUTINE W REFLEX MICROSCOPIC
Bilirubin Urine: NEGATIVE
Glucose, UA: NEGATIVE mg/dL
Ketones, ur: NEGATIVE mg/dL
Leukocytes,Ua: NEGATIVE
Nitrite: NEGATIVE
Protein, ur: NEGATIVE mg/dL
Specific Gravity, Urine: 1.015 (ref 1.005–1.030)
pH: 7 (ref 5.0–8.0)

## 2024-04-02 LAB — LIPID PANEL
Cholesterol: 232 mg/dL — ABNORMAL HIGH (ref 0–200)
HDL: 85 mg/dL (ref >40)
LDL Cholesterol: 133 mg/dL — ABNORMAL HIGH (ref 0–99)
Total CHOL/HDL Ratio: 2.7 ratio
Triglycerides: 68 mg/dL (ref <150)
VLDL: 14 mg/dL (ref 0–40)

## 2024-04-02 LAB — HEMOGLOBIN A1C
Hgb A1c MFr Bld: 5.5 % (ref 4.8–5.6)
Mean Plasma Glucose: 111.15 mg/dL

## 2024-04-02 LAB — URINALYSIS, MICROSCOPIC (REFLEX)

## 2024-04-02 LAB — TSH: TSH: 0.815 u[IU]/mL (ref 0.350–4.500)

## 2024-04-02 NOTE — Assessment & Plan Note (Signed)
 Managed with healthy diet choices.

## 2024-04-02 NOTE — Assessment & Plan Note (Signed)
 Blood pressure is well controlled.  Current medications amlodipine , metoprolol , olmesartan  and spironolactone  Will continue same regimen along with efforts to limit dietary sodium.

## 2024-04-02 NOTE — Progress Notes (Signed)
 Date:  04/02/2024   Name:  Kendra Lane   DOB:  1935-06-25   MRN:  [id number]   Chief Complaint: Annual Exam Kendra Lane is a 89 y.o. female who presents today for her Complete Annual Exam. She feels fairly well. She reports exercising - some. She reports she is sleeping poorly. Breast complaints - none.  Health Maintenance  Topic Date Due   DTaP/Tdap/Td vaccine (1 - Tdap) Never done   COVID-19 Vaccine (3 - Moderna risk series) 04/21/2020   Zoster (Shingles) Vaccine (1 of 2) 07/03/2024*   DEXA scan (bone density measurement)  04/02/2025*   Flu Shot  04/13/2024   Medicare Annual Wellness Visit  06/14/2024   Mammogram  12/04/2024   Pneumococcal Vaccine for age over 91  Completed   Hepatitis B Vaccine  Aged Out   HPV Vaccine  Aged Out   Meningitis B Vaccine  Aged Out  *Topic was postponed. The date shown is not the original due date.     Hypertension Pertinent negatives include no chest pain, headaches, palpitations or shortness of breath.  Hyperlipidemia Pertinent negatives include no chest pain, myalgias or shortness of breath.    Review of Systems  Constitutional:  Negative for fatigue and unexpected weight change.  HENT:  Negative for trouble swallowing.   Eyes:  Negative for visual disturbance.  Respiratory:  Negative for cough, chest tightness, shortness of breath and wheezing.   Cardiovascular:  Negative for chest pain, palpitations and leg swelling.  Gastrointestinal:  Negative for abdominal pain, constipation and diarrhea.  Musculoskeletal:  Negative for arthralgias and myalgias.  Skin:  Negative for color change and rash.  Neurological:  Negative for dizziness, weakness, light-headedness and headaches.  Psychiatric/Behavioral:  Positive for dysphoric mood and sleep disturbance. Negative for suicidal ideas. The patient is nervous/anxious.      Lab Results  Component Value Date   NA 139 07/27/2023   K 4.1 07/27/2023   CO2 25 07/27/2023   GLUCOSE  110 (H) 07/27/2023   BUN 15 07/27/2023   CREATININE 0.83 07/27/2023   CALCIUM 9.6 07/27/2023   EGFR 83 07/18/2022   GFRNONAA >60 07/27/2023   Lab Results  Component Value Date   CHOL 215 (H) 09/16/2022   HDL 80 09/16/2022   LDLCALC 120 (H) 09/16/2022   TRIG 74 09/16/2022   CHOLHDL 2.7 09/16/2022   Lab Results  Component Value Date   TSH 1.90 07/18/2022   Lab Results  Component Value Date   HGBA1C 5.8 (H) 09/16/2022   Lab Results  Component Value Date   WBC 4.4 07/27/2023   HGB 13.3 07/27/2023   HCT 39.4 07/27/2023   MCV 91.4 07/27/2023   PLT 323 07/27/2023   Lab Results  Component Value Date   ALT 13 07/27/2023   AST 18 07/27/2023   ALKPHOS 35 (L) 07/27/2023   BILITOT 0.8 07/27/2023   Lab Results  Component Value Date   VD25OH 36.4 06/16/2021     Patient Active Problem List   Diagnosis Date Noted   Leg heaviness 06/01/2023   Mixed hyperlipidemia 09/16/2022   Prediabetes 09/16/2022   Asymptomatic microscopic hematuria 09/16/2022   Age-related osteoporosis without current pathological fracture 06/12/2020   Aortic atherosclerosis (HCC) 04/02/2020   Arthritis of shoulder 11/30/2017   Bigeminy 04/07/2016   Benign esophageal stricture 01/21/2015   Essential (primary) hypertension 01/21/2015   Hammer toe 01/21/2015   Lipoma of skin and subcutaneous tissue of trunk 01/21/2015    Allergies  Allergen  Reactions   Valsartan Other (See Comments)    dizziness    Past Surgical History:  Procedure Laterality Date   ABDOMINAL HYSTERECTOMY     CATARACT EXTRACTION W/PHACO Right 07/23/2020   Procedure: CATARACT EXTRACTION PHACO AND INTRAOCULAR LENS PLACEMENT (IOC) RIGHT 7.62 01:26.5 8.8%;  Surgeon: Mittie Gaskin, MD;  Location: [facility];  Service: Ophthalmology;  Laterality: Right;   CATARACT EXTRACTION W/PHACO Left 04/01/2021   Procedure: CATARACT EXTRACTION PHACO AND INTRAOCULAR LENS PLACEMENT (IOC) LEFT 3.64 00:56.9;  Surgeon: Mittie Gaskin, MD;  Location: [facility];  Service: Ophthalmology;  Laterality: Left;   ESOPHAGOGASTRODUODENOSCOPY (EGD) WITH ESOPHAGEAL DILATION  10/2014   Dr. Jinny   TOTAL VAGINAL HYSTERECTOMY  1993   TUBAL LIGATION      Social History   Tobacco Use   Smoking status: Never    Passive exposure: Never   Smokeless tobacco: Never   Tobacco comments:    smoking cessation materials not required  Vaping Use   Vaping status: Never Used  Substance Use Topics   Alcohol use: Not Currently    Alcohol/week: 2.0 standard drinks of alcohol    Types: 2 Cans of beer per week    Comment: last use 08/2019   Drug use: No     Medication list has been reviewed and updated.  Current Meds  Medication Sig   amLODipine  (NORVASC ) 10 MG tablet TAKE 1 TABLET BY MOUTH DAILY   aspirin  81 MG tablet Take 81 mg by mouth daily.   calcium-vitamin D  (OSCAL WITH D) 500-200 MG-UNIT tablet Take 1 tablet by mouth. 600 mg daily   metoprolol  succinate (TOPROL -XL) 25 MG 24 hr tablet TAKE 3 TABLETS(75 MG) BY MOUTH DAILY   Multiple Vitamins-Minerals (CENTRUM SILVER 50+WOMEN PO) Take 1 tablet by mouth daily.   olmesartan  (BENICAR ) 40 MG tablet TAKE 1 TABLET(40 MG) BY MOUTH DAILY   omeprazole  (PRILOSEC) 20 MG capsule TAKE 1 CAPSULE(20 MG) BY MOUTH DAILY   spironolactone  (ALDACTONE ) 25 MG tablet TAKE 1 TABLET(25 MG) BY MOUTH DAILY       04/02/2024    9:16 AM 11/29/2023    9:54 AM 06/01/2023    2:23 PM 03/30/2023    9:55 AM  GAD 7 : Generalized Anxiety Score  Nervous, Anxious, on Edge 3 2 2  0  Control/stop worrying 3 2 2  0  Worry too much - different things 3 2 2  0  Trouble relaxing 3 0 0 0  Restless 3 1 1  0  Easily annoyed or irritable 3 2 2  0  Afraid - awful might happen 3 0 1 0  Total GAD 7 Score 21 9 10  0  Anxiety Difficulty Extremely difficult Not difficult at all Somewhat difficult Not difficult at all       04/02/2024    9:16 AM 11/29/2023    9:53 AM 06/15/2023    2:10 PM  Depression screen PHQ 2/9   Decreased Interest 2 0 0  Down, Depressed, Hopeless 2 1 1   PHQ - 2 Score 4 1 1   Altered sleeping 3 1 1   Tired, decreased energy 3 2 1   Change in appetite 0 0 0  Feeling bad or failure about yourself  0 0 0  Trouble concentrating 2 1 0  Moving slowly or fidgety/restless 0 1 0  Suicidal thoughts 0 0 0  PHQ-9 Score 12 6 3   Difficult doing work/chores Very difficult Somewhat difficult Not difficult at all    BP Readings from Last 3 Encounters:  04/02/24 ROLLEN)  140/78  02/14/24 (!) 170/78  01/23/24 (!) 157/80    Physical Exam Vitals and nursing note reviewed.  Constitutional:      General: She is not in acute distress.    Appearance: She is well-developed.  HENT:     Head: Normocephalic and atraumatic.     Right Ear: Tympanic membrane and ear canal normal.     Left Ear: Tympanic membrane and ear canal normal.     Nose:     Right Sinus: No maxillary sinus tenderness.     Left Sinus: No maxillary sinus tenderness.  Eyes:     General: No scleral icterus.       Right eye: No discharge.        Left eye: No discharge.     Conjunctiva/sclera: Conjunctivae normal.  Neck:     Thyroid : No thyromegaly.     Vascular: No carotid bruit.  Cardiovascular:     Rate and Rhythm: Normal rate and regular rhythm.     Pulses: Normal pulses.     Heart sounds: Normal heart sounds.  Pulmonary:     Effort: Pulmonary effort is normal. No respiratory distress.     Breath sounds: No wheezing.  Chest:       Comments: lipoma Abdominal:     General: Bowel sounds are normal.     Palpations: Abdomen is soft.     Tenderness: There is no abdominal tenderness.  Musculoskeletal:       Arms:     Cervical back: Normal range of motion. No erythema.     Right lower leg: No edema.     Left lower leg: No edema.     Comments: lipoma  Lymphadenopathy:     Cervical: No cervical adenopathy.  Skin:    General: Skin is warm and dry.     Findings: No rash.  Neurological:     Mental Status: She is alert and  oriented to person, place, and time.     Cranial Nerves: No cranial nerve deficit.     Sensory: No sensory deficit.     Deep Tendon Reflexes: Reflexes are normal and symmetric.  Psychiatric:        Attention and Perception: Attention normal.        Mood and Affect: Mood normal.     Wt Readings from Last 3 Encounters:  04/02/24 124 lb (56.2 kg)  02/14/24 124 lb 6.4 oz (56.4 kg)  01/23/24 123 lb (55.8 kg)    BP (!) 140/78   Pulse 92   Ht 5' 6 (1.676 m)   Wt 124 lb (56.2 kg)   SpO2 100%   BMI 20.01 kg/m   Assessment and Plan:  Problem List Items Addressed This Visit       Unprioritized   Benign esophageal stricture (Chronic)   Reflux symptoms are controlled on omeprazole . No swallowing issues noted. Patient denies red flag symptoms - no melena, weight loss, dysphagia.       Relevant Orders   CBC with Differential/Platelet   Essential (primary) hypertension (Chronic)   Blood pressure is well controlled.  Current medications amlodipine , metoprolol , olmesartan  and spironolactone  Will continue same regimen along with efforts to limit dietary sodium.       Relevant Orders   CBC with Differential/Platelet   Comprehensive metabolic panel with GFR   TSH   Urinalysis, Routine w reflex microscopic   Mixed hyperlipidemia (Chronic)   Managed with diet only.      Relevant Orders   Lipid panel  Prediabetes (Chronic)   Managed with healthy diet choices.       Relevant Orders   Hemoglobin A1c   Other Visit Diagnoses       Annual physical exam    -  Primary   mammo done in March aged out of colonoscopies declines DEXA     Positive screening for depression on 9-item Patient Health Questionnaire (PHQ-9)       She denies significant depression or anxiety attributes symptoms to living alone and aging is not interested in any medication at this time       Return in about 5 months (around 09/02/2024) for HTN.    Leita HILARIO Adie, MD [facility] [place]

## 2024-04-02 NOTE — Assessment & Plan Note (Signed)
 Reflux symptoms are controlled on omeprazole . No swallowing issues noted. Patient denies red flag symptoms - no melena, weight loss, dysphagia.

## 2024-04-02 NOTE — Assessment & Plan Note (Signed)
Managed with diet only.

## 2024-05-15 ENCOUNTER — Telehealth: Payer: Self-pay | Admitting: Internal Medicine

## 2024-05-15 NOTE — Telephone Encounter (Signed)
 LEFT VOICE MAIL TO RESCHEDULE AWV.

## 2024-05-15 NOTE — Telephone Encounter (Signed)
-----   Message from Crowell H sent at 05/15/2024  2:33 PM EDT ----- Regarding: Patient's AWV Called patient today to change her AWV appt 06/20/24 in person; to a video or telephone visit.  Patient cannot do not video or virtual visit...  has no smartphone and doesn't want mychart?? Please schedule AWV with provider.  Thanks [employer]

## 2024-05-31 ENCOUNTER — Other Ambulatory Visit: Payer: Self-pay | Admitting: Internal Medicine

## 2024-05-31 DIAGNOSIS — I10 Essential (primary) hypertension: Secondary | ICD-10-CM

## 2024-06-01 NOTE — Telephone Encounter (Signed)
 Requested Prescriptions  Pending Prescriptions Disp Refills   spironolactone  (ALDACTONE ) 25 MG tablet [Pharmacy Med Name: SPIRONOLACTONE  25MG  TABLETS] 90 tablet 1    Sig: TAKE 1 TABLET(25 MG) BY MOUTH DAILY     Cardiovascular: Diuretics - Aldosterone Antagonist Failed - 06/01/2024  9:07 AM      Failed - Last BP in normal range    BP Readings from Last 1 Encounters:  04/02/24 (!) 140/78         Passed - Cr in normal range and within 180 days    Creatinine  Date Value Ref Range Status  01/22/2014 0.60 0.60 - 1.30 mg/dL Final   Creatinine, Ser  Date Value Ref Range Status  04/02/2024 0.79 0.44 - 1.00 mg/dL Final         Passed - K in normal range and within 180 days    Potassium  Date Value Ref Range Status  04/02/2024 4.3 3.5 - 5.1 mmol/L Final  01/22/2014 3.8 3.5 - 5.1 mmol/L Final         Passed - Na in normal range and within 180 days    Sodium  Date Value Ref Range Status  04/02/2024 140 135 - 145 mmol/L Final  07/18/2022 142 137 - 147 Final  01/22/2014 141 136 - 145 mmol/L Final         Passed - eGFR is 30 or above and within 180 days    EGFR (African American)  Date Value Ref Range Status  01/22/2014 >60  Final   GFR calc Af Amer  Date Value Ref Range Status  10/07/2020 96 >59 mL/min/1.73 Final    Comment:    **In accordance with recommendations from the NKF-ASN Task force,**   [facility] is in the process of updating its eGFR calculation to the   2021 CKD-EPI creatinine equation that estimates kidney function   without a race variable.    EGFR (Non-African Amer.)  Date Value Ref Range Status  01/22/2014 >60  Final    Comment:    eGFR values <2mL/min/1.73 m2 may be an indication of chronic kidney disease (CKD). Calculated eGFR is useful in patients with stable renal function. The eGFR calculation will not be reliable in acutely ill patients when serum creatinine is changing rapidly. It is not useful in  patients on dialysis. The eGFR calculation may not  be applicable to patients at the low and high extremes of body sizes, pregnant women, and vegetarians.    GFR, Estimated  Date Value Ref Range Status  04/02/2024 >60 >60 mL/min Final    Comment:    (NOTE) Calculated using the CKD-EPI Creatinine Equation (2021)    eGFR  Date Value Ref Range Status  07/18/2022 83  Final  01/04/2022 73 >59 mL/min/1.73 Final         Passed - Valid encounter within last 6 months    Recent Outpatient Visits           2 months ago Annual physical exam   [place] [facility], Leita DEL, MD   6 months ago Essential (primary) hypertension   [place] [facility] at [facility], Leita DEL, MD       Future Appointments             In 8 months Francisca Redell BROCKS, MD [facility] [place]             olmesartan  (BENICAR ) 40 MG tablet [facility]  Name: OLMESARTAN  MEDOXOMIL 40MG  TABLETS] 90 tablet 1    Sig: TAKE 1 TABLET(40 MG) BY MOUTH DAILY     Cardiovascular:  Angiotensin Receptor Blockers Failed - 06/01/2024  9:07 AM      Failed - Last BP in normal range    BP Readings from Last 1 Encounters:  04/02/24 (!) 140/78         Passed - Cr in normal range and within 180 days    Creatinine  Date Value Ref Range Status  01/22/2014 0.60 0.60 - 1.30 mg/dL Final   Creatinine, Ser  Date Value Ref Range Status  04/02/2024 0.79 0.44 - 1.00 mg/dL Final         Passed - K in normal range and within 180 days    Potassium  Date Value Ref Range Status  04/02/2024 4.3 3.5 - 5.1 mmol/L Final  01/22/2014 3.8 3.5 - 5.1 mmol/L Final         Passed - Patient is not pregnant      Passed - Valid encounter within last 6 months    Recent Outpatient Visits           2 months ago Annual physical exam   [place] [facility] at [facility], Leita DEL, MD   6 months ago Essential (primary) hypertension   [place] [facility] at [facility], Leita DEL, MD       Future Appointments             In 8 months Francisca, Redell BROCKS, MD [facility] [place]

## 2024-06-20 ENCOUNTER — Ambulatory Visit

## 2024-06-28 ENCOUNTER — Other Ambulatory Visit: Payer: Self-pay | Admitting: Internal Medicine

## 2024-06-28 DIAGNOSIS — K222 Esophageal obstruction: Secondary | ICD-10-CM

## 2024-06-28 DIAGNOSIS — I10 Essential (primary) hypertension: Secondary | ICD-10-CM

## 2024-06-29 ENCOUNTER — Ambulatory Visit (INDEPENDENT_AMBULATORY_CARE_PROVIDER_SITE_OTHER): Admitting: Internal Medicine

## 2024-06-29 ENCOUNTER — Encounter: Payer: Self-pay | Admitting: Internal Medicine

## 2024-06-29 VITALS — BP 112/60 | HR 53 | Ht 66.0 in | Wt 119.0 lb

## 2024-06-29 DIAGNOSIS — R29898 Other symptoms and signs involving the musculoskeletal system: Secondary | ICD-10-CM | POA: Diagnosis not present

## 2024-06-29 DIAGNOSIS — F5101 Primary insomnia: Secondary | ICD-10-CM | POA: Insufficient documentation

## 2024-06-29 DIAGNOSIS — F419 Anxiety disorder, unspecified: Secondary | ICD-10-CM | POA: Diagnosis not present

## 2024-06-29 NOTE — Progress Notes (Addendum)
 "   Date:  06/29/2024   Name:  Kendra Lane   DOB:  29-Dec-1934   MRN:  [id number]   Chief Complaint: Extremity Weakness (Patient said both of her legs are  tired almost constantly. Constantly weak in thighs. Started 2 months ago.)  Extremity Weakness  This is a new problem. The current episode started more than 1 month ago. Pertinent negatives include no fever or numbness.  Insomnia Primary symptoms: sleep disturbance, difficulty falling asleep, frequent awakening.   The onset quality is undetermined. The problem occurs every several days. The problem is unchanged. The symptoms are aggravated by anxiety. Nothing relieves the symptoms. Past treatments include nothing.  Anxiety Presents for follow-up visit. Symptoms include excessive worry, insomnia and muscle tension. Patient reports no chest pain, depressed mood, dizziness, feeling of choking, nervous/anxious behavior, restlessness or shortness of breath. Symptoms occur most days. The severity of symptoms is moderate (she wonders if her sense of weakness is related to anxiety). The quality of sleep is fair.    She feels that her thighs are weak when standing or walking but they never give was and she has not fallen.  She denies pain or numbness.  She denies low back pain, hip pain or knee pain.  She points to her anterior thigh muscles as the weak area.   Review of Systems  Constitutional:  Negative for chills, fatigue and fever.  Respiratory:  Negative for chest tightness and shortness of breath.   Cardiovascular:  Negative for chest pain and leg swelling.  Musculoskeletal:  Positive for extremity weakness. Negative for arthralgias, gait problem and joint swelling.  Neurological:  Positive for weakness. Negative for dizziness, syncope, speech difficulty, numbness and headaches.  Psychiatric/Behavioral:  Positive for sleep disturbance. The patient has insomnia. The patient is not nervous/anxious.      Lab Results  Component Value  Date   NA 140 04/02/2024   K 4.3 04/02/2024   CO2 28 04/02/2024   GLUCOSE 101 (H) 04/02/2024   BUN 14 04/02/2024   CREATININE 0.79 04/02/2024   CALCIUM 9.6 04/02/2024   EGFR 83 07/18/2022   GFRNONAA >60 04/02/2024   Lab Results  Component Value Date   CHOL 232 (H) 04/02/2024   HDL 85 04/02/2024   LDLCALC 133 (H) 04/02/2024   TRIG 68 04/02/2024   CHOLHDL 2.7 04/02/2024   Lab Results  Component Value Date   TSH 0.815 04/02/2024   Lab Results  Component Value Date   HGBA1C 5.5 04/02/2024   Lab Results  Component Value Date   WBC 5.9 04/02/2024   HGB 11.8 (L) 04/02/2024   HCT 35.5 (L) 04/02/2024   MCV 91.5 04/02/2024   PLT 297 04/02/2024   Lab Results  Component Value Date   ALT 16 04/02/2024   AST 20 04/02/2024   ALKPHOS 34 (L) 04/02/2024   BILITOT 0.7 04/02/2024   Lab Results  Component Value Date   VD25OH 36.4 06/16/2021     Patient Active Problem List   Diagnosis Date Noted   Primary insomnia 06/29/2024   Mild anxiety 06/29/2024   Complaints of leg weakness 06/01/2023   Mixed hyperlipidemia 09/16/2022   Prediabetes 09/16/2022   Asymptomatic microscopic hematuria 09/16/2022   Age-related osteoporosis without current pathological fracture 06/12/2020   Aortic atherosclerosis 04/02/2020   Arthritis of shoulder 11/30/2017   Bigeminy 04/07/2016   Benign esophageal stricture 01/21/2015   Essential (primary) hypertension 01/21/2015   Hammer toe 01/21/2015   Lipoma of skin and subcutaneous tissue of  trunk 01/21/2015    Allergies  Allergen Reactions   Valsartan Other (See Comments)    dizziness    Past Surgical History:  Procedure Laterality Date   ABDOMINAL HYSTERECTOMY     CATARACT EXTRACTION W/PHACO Right 07/23/2020   Procedure: CATARACT EXTRACTION PHACO AND INTRAOCULAR LENS PLACEMENT (IOC) RIGHT 7.62 01:26.5 8.8%;  Surgeon: Mittie Gaskin, MD;  Location: [facility];  Service: Ophthalmology;  Laterality: Right;   CATARACT EXTRACTION  W/PHACO Left 04/01/2021   Procedure: CATARACT EXTRACTION PHACO AND INTRAOCULAR LENS PLACEMENT (IOC) LEFT 3.64 00:56.9;  Surgeon: Mittie Gaskin, MD;  Location: [facility];  Service: Ophthalmology;  Laterality: Left;   ESOPHAGOGASTRODUODENOSCOPY (EGD) WITH ESOPHAGEAL DILATION  10/2014   Dr. Jinny   TOTAL VAGINAL HYSTERECTOMY  1993   TUBAL LIGATION      Social History   Tobacco Use   Smoking status: Never    Passive exposure: Never   Smokeless tobacco: Never   Tobacco comments:    smoking cessation materials not required  Vaping Use   Vaping status: Never Used  Substance Use Topics   Alcohol use: Not Currently    Alcohol/week: 2.0 standard drinks of alcohol    Types: 2 Cans of beer per week    Comment: last use 08/2019   Drug use: No     Medication list has been reviewed and updated.  Current Meds  Medication Sig   aspirin  81 MG tablet Take 81 mg by mouth daily.   calcium-vitamin D  (OSCAL WITH D) 500-200 MG-UNIT tablet Take 1 tablet by mouth. 600 mg daily   Multiple Vitamins-Minerals (CENTRUM SILVER 50+WOMEN PO) Take 1 tablet by mouth daily.   olmesartan  (BENICAR ) 40 MG tablet TAKE 1 TABLET(40 MG) BY MOUTH DAILY   spironolactone  (ALDACTONE ) 25 MG tablet TAKE 1 TABLET(25 MG) BY MOUTH DAILY   [DISCONTINUED] amLODipine  (NORVASC ) 10 MG tablet TAKE 1 TABLET BY MOUTH DAILY   [DISCONTINUED] metoprolol  succinate (TOPROL -XL) 25 MG 24 hr tablet TAKE 3 TABLETS(75 MG) BY MOUTH DAILY   [DISCONTINUED] omeprazole  (PRILOSEC) 20 MG capsule TAKE 1 CAPSULE(20 MG) BY MOUTH DAILY       06/29/2024    2:13 PM 04/02/2024    9:16 AM 11/29/2023    9:54 AM 06/01/2023    2:23 PM  GAD 7 : Generalized Anxiety Score  Nervous, Anxious, on Edge 1 3 2 2   Control/stop worrying 1 3 2 2   Worry too much - different things 1 3 2 2   Trouble relaxing 1 3 0 0  Restless 1 3 1 1   Easily annoyed or irritable 1 3 2 2   Afraid - awful might happen 1 3 0 1  Total GAD 7 Score 7 21 9 10   Anxiety  Difficulty Not difficult at all Extremely difficult Not difficult at all Somewhat difficult       08/01/2024    3:43 PM 06/29/2024    2:13 PM 04/02/2024    9:16 AM  Depression screen PHQ 2/9  Decreased Interest 0 1 2  Down, Depressed, Hopeless 1 1 2   PHQ - 2 Score 1 2 4   Altered sleeping 1 1 3   Tired, decreased energy 1 1 3   Change in appetite 0 0 0  Feeling bad or failure about yourself  0 0 0  Trouble concentrating 0 1 2  Moving slowly or fidgety/restless 0 0 0  Suicidal thoughts 0 0 0  PHQ-9 Score 3 5  12    Difficult doing work/chores Not difficult at all Not difficult  at all Very difficult     Data saved with a previous flowsheet row definition    BP Readings from Last 3 Encounters:  08/01/24 134/66  07/28/24 (!) 169/75  06/29/24 112/60    Physical Exam Vitals and nursing note reviewed.  Constitutional:      General: She is not in acute distress.    Appearance: Normal appearance. She is well-developed.  HENT:     Head: Normocephalic and atraumatic.  Cardiovascular:     Rate and Rhythm: Normal rate and regular rhythm.  Pulmonary:     Effort: Pulmonary effort is normal. No respiratory distress.     Breath sounds: No wheezing or rhonchi.  Abdominal:     General: Abdomen is flat.     Palpations: Abdomen is soft.  Musculoskeletal:        General: No swelling or tenderness. Normal range of motion.     Cervical back: Normal range of motion.     Lumbar back: Normal. Negative right straight leg raise test and negative left straight leg raise test.     Right hip: Normal. No bony tenderness. Normal range of motion.     Left hip: Normal. No bony tenderness. Normal range of motion.     Right knee: Normal.     Left knee: Normal.     Right lower leg: No edema.     Left lower leg: No edema.  Lymphadenopathy:     Cervical: No cervical adenopathy.  Skin:    General: Skin is warm and dry.     Findings: No rash.  Neurological:     General: No focal deficit present.      Mental Status: She is alert and oriented to person, place, and time.     Cranial Nerves: No cranial nerve deficit.     Sensory: No sensory deficit.     Motor: No weakness, tremor or abnormal muscle tone.     Coordination: Coordination is intact.     Gait: Gait normal.     Deep Tendon Reflexes: Reflexes normal.     Reflex Scores:      Patellar reflexes are 2+ on the right side and 2+ on the left side.      Achilles reflexes are 1+ on the right side and 1+ on the left side. Psychiatric:        Mood and Affect: Mood normal.        Behavior: Behavior normal.     Wt Readings from Last 3 Encounters:  08/01/24 121 lb 6.4 oz (55.1 kg)  07/28/24 119 lb 0.8 oz (54 kg)  06/29/24 119 lb (54 kg)    BP 112/60   Pulse (!) 53   Ht 5' 6 (1.676 m)   Wt 119 lb (54 kg)   SpO2 94%   BMI 19.21 kg/m   Assessment and Plan:  Problem List Items Addressed This Visit       Unprioritized   Complaints of leg weakness   No weakness or sensory deficit on exam no joint pain or decreased ROM No clear cause for symptoms - pt reassured       Primary insomnia - Primary   with mild anxiety - would recommend Sleepy time tea qhs to help with sleep may help reduce daytime anxiety and sense of weakness       Mild anxiety   hopefully with improve with better sleep if no improvement or worsening - follow up for consideration of low dose Sertraline  No follow-ups on file.    Leita HILARIO Adie, MD [facility] [place]        "

## 2024-06-29 NOTE — Telephone Encounter (Signed)
 Requested medication (s) are due for refill today- yes  Requested medication (s) are on the active medication list -yes  Future visit scheduled -yes-today  Last refill: 06/24/23  Notes to clinic: Patient has appointment today- will send to office for RF  Requested Prescriptions  Pending Prescriptions Disp Refills   amLODipine  (NORVASC ) 10 MG tablet [Pharmacy Med Name: AMLODIPINE  BESYLATE 10MG TABLETS] 90 tablet 3    Sig: TAKE 1 TABLET BY MOUTH DAILY     Cardiovascular: Calcium Channel Blockers 2 Failed - 06/29/2024  2:16 PM      Failed - Last BP in normal range    BP Readings from Last 1 Encounters:  04/02/24 (!) 140/78         Passed - Last Heart Rate in normal range    Pulse Readings from Last 1 Encounters:  04/02/24 92         Passed - Valid encounter within last 6 months    Recent Outpatient Visits           Today    [facility] & [facility] Medicine at [facility], Leita DEL, MD   2 months ago Annual physical exam   [facility] Primary [facility] Medicine at [facility], Leita DEL, MD   7 months ago Essential (primary) hypertension   [place] [facility] Sports Medicine at [facility], Leita DEL, MD       Future Appointments             In 7 months Francisca, Redell BROCKS, MD [facility] [place]             omeprazole  (PRILOSEC) 20 MG capsule [Pharmacy Med Name: OMEPRAZOLE  20MG  CAPSULES] 90 capsule 3    Sig: TAKE 1 CAPSULE(20 MG) BY MOUTH DAILY     Gastroenterology: Proton Pump Inhibitors Passed - 06/29/2024  2:16 PM      Passed - Valid encounter within last 12 months    Recent Outpatient Visits           Today    [facility] Care & Sports Medicine at [facility], Leita DEL, MD   2 months ago Annual physical exam   [facility] Primary Care [facility] Sports Medicine at [facility], Leita DEL, MD   7 months ago Essential (primary) hypertension    Primary Care &  Sports Medicine at [facility], Leita DEL, MD       Future Appointments             In 7 months Francisca Redell BROCKS, MD [facility] [place]               Requested Prescriptions  Pending Prescriptions Disp Refills   amLODipine  (NORVASC ) 10 MG tablet [Pharmacy Med Name: AMLODIPINE  BESYLATE 10MG TABLETS] 90 tablet 3    Sig: TAKE 1 TABLET BY MOUTH DAILY     Cardiovascular: Calcium Channel Blockers 2 Failed - 06/29/2024  2:16 PM      Failed - Last BP in normal range    BP Readings from Last 1 Encounters:  04/02/24 (!) 140/78         Passed - Last Heart Rate in normal range    Pulse Readings from Last 1 Encounters:  04/02/24 92         Passed - Valid encounter within last 6 months    Recent Outpatient Visits           Today  [facility] Primary Care & Sports Medicine at [facility], Leita DEL, MD   2 months ago Annual physical exam   [facility] Primary Care & Sports Medicine at [facility], Leita DEL, MD   7 months ago Essential (primary) hypertension   [place] Primary Care & Sports Medicine at [facility], Leita DEL, MD       Future Appointments             In 7 months Francisca, Redell BROCKS, MD [facility] [place]             omeprazole  (PRILOSEC) 20 MG capsule [Pharmacy Med Name: OMEPRAZOLE  20MG  CAPSULES] 90 capsule 3    Sig: TAKE 1 CAPSULE(20 MG) BY MOUTH DAILY     Gastroenterology: Proton Pump Inhibitors Passed - 06/29/2024  2:16 PM      Passed - Valid encounter within last 12 months    Recent Outpatient Visits           Today    [facility] Primary Care & Sports Medicine at [facility], Leita DEL, MD   2 months ago Annual physical exam   [facility] Primary Care & Sports Medicine at [facility], Leita DEL, MD   7 months ago Essential (primary) hypertension   [facility] Primary Care & Sports Medicine at [facility], Leita DEL, MD       Future  Appointments             In 7 months Francisca, Redell BROCKS, MD [facility] [place]

## 2024-06-30 ENCOUNTER — Other Ambulatory Visit: Payer: Self-pay | Admitting: Internal Medicine

## 2024-07-03 NOTE — Telephone Encounter (Signed)
 Requested Prescriptions  Pending Prescriptions Disp Refills   metoprolol  succinate (TOPROL -XL) 25 MG 24 hr tablet [Pharmacy Med Name: METOPROLOL  ER SUCCINATE 25MG  TABS] 270 tablet 0    Sig: TAKE 3 TABLETS(75 MG) BY MOUTH DAILY     Cardiovascular:  Beta Blockers Passed - 07/03/2024  8:29 AM      Passed - Last BP in normal range    BP Readings from Last 1 Encounters:  06/29/24 112/60         Passed - Last Heart Rate in normal range    Pulse Readings from Last 1 Encounters:  06/29/24 (!) 53         Passed - Valid encounter within last 6 months    Recent Outpatient Visits           4 days ago Primary insomnia   [place] Primary [facility] & Sports Medicine at [facility], Leita DEL, MD   3 months ago Annual physical exam   [facility] Primary Care & Sports Medicine at [facility], Leita DEL, MD   7 months ago Essential (primary) hypertension   [facility] Primary Care & Sports Medicine at [facility], Leita DEL, MD       Future Appointments             In 7 months Francisca, Redell BROCKS, MD [facility] [place]

## 2024-07-28 ENCOUNTER — Ambulatory Visit
Admission: EM | Admit: 2024-07-28 | Discharge: 2024-07-28 | Disposition: A | Discharge status: Home / Self Care | Attending: Family Medicine | Admitting: Family Medicine

## 2024-07-28 ENCOUNTER — Encounter: Payer: Self-pay | Admitting: Emergency Medicine

## 2024-07-28 DIAGNOSIS — J0100 Acute maxillary sinusitis, unspecified: Secondary | ICD-10-CM | POA: Diagnosis not present

## 2024-07-28 MED ORDER — AMOXICILLIN 875 MG PO TABS
875.0000 mg | ORAL_TABLET | Freq: Two times a day (BID) | ORAL | 0 refills | Status: AC
Start: 2024-07-28 — End: 2024-08-04

## 2024-07-28 NOTE — ED Provider Notes (Signed)
 [facility]   [id number] 07/28/24 Arrival Time: 1345  ASSESSMENT & PLAN:  1. Acute non-recurrent maxillary sinusitis    Begin: Meds ordered this encounter  Medications   amoxicillin (AMOXIL) 875 MG tablet    Sig: Take 1 tablet (875 mg total) by mouth 2 (two) times daily for 7 days.    Dispense:  14 tablet    Refill:  0   Discussed typical duration of symptoms. OTC symptom care as needed. Ensure adequate fluid intake and rest.   Follow-up Information     Justus Leita DEL, MD.   Specialty: Internal Medicine Why: As needed. Contact information: [address] [place] [address] [phone]                 Reviewed expectations re: course of current medical issues. Questions answered. Outlined signs and symptoms indicating need for more acute intervention. Patient verbalized understanding. After Visit Summary given.   SUBJECTIVE: History from: patient.  Kendra Lane is a 89 y.o. female who presents with complaint of nasal congestion, post-nasal drainage, and sinus pain. Onset gradual, a week ago.  Denies fever.  Social History   Tobacco Use  Smoking Status Never   Passive exposure: Never  Smokeless Tobacco Never  Tobacco Comments   smoking cessation materials not required    OBJECTIVE:  Vitals:   07/28/24 1356 07/28/24 1357  BP:  (!) 169/75  Pulse:  82  Resp:  16  Temp:  99 F (37.2 C)  TempSrc:  Oral  SpO2:  97%  Weight: 54 kg   Height: 5' 6 (1.676 m)     General appearance: alert; no distress HEENT: nasal congestion; clear runny nose; throat irritation secondary to post-nasal drainage; bilateral maxillary tenderness to palpation; turbinates boggy Neck: supple without LAD; trachea midline Lungs: unlabored respirations, symmetrical air entry; cough: absent; no respiratory distress Skin: warm and dry Psychological: alert and cooperative; normal mood and affect  Allergies  Allergen Reactions   Valsartan  Other (See Comments)    dizziness    Past Medical History:  Diagnosis Date   Dysrhythmia    hx of, ok now   GERD (gastroesophageal reflux disease)    Headache    twice a day sometimes   Hypertension    Neck pain    Shoulder pain, left    Family History  Problem Relation Age of Onset   Hypertension Mother    Other Father        old age   Breast cancer Daughter 75   Social History   Socioeconomic History   Marital status: Widowed    Spouse name: Not on file   Number of children: 8   Years of education: Not on file   Highest education level: 10th grade  Occupational History   Occupation: Retired  Tobacco Use   Smoking status: Never    Passive exposure: Never   Smokeless tobacco: Never   Tobacco comments:    smoking cessation materials not required  Vaping Use   Vaping status: Never Used  Substance and Sexual Activity   Alcohol use: Not Currently    Alcohol/week: 2.0 standard drinks of alcohol    Types: 2 Cans of beer per week    Comment: last use 08/2019   Drug use: No   Sexual activity: Not Currently  Other Topics Concern   Not on file  Social History Narrative   Pt's son lives with her   Social Drivers of Health  Financial Resource Strain: Low Risk  (06/15/2023)   Overall Financial Resource Strain (CARDIA)    Difficulty of Paying Living Expenses: Not hard at all  Food Insecurity: No Food Insecurity (06/15/2023)   Hunger Vital Sign    Worried About Running Out of Food in the Last Year: Never true    Ran Out of Food in the Last Year: Never true  Transportation Needs: No Transportation Needs (06/15/2023)   PRAPARE - Administrator, Civil Service (Medical): No    Lack of Transportation (Non-Medical): No  Physical Activity: Sufficiently Active (06/15/2023)   Exercise Vital Sign    Days of Exercise per Week: 7 days    Minutes of Exercise per Session: 30 min  Stress: No Stress Concern Present (06/15/2023)   [employer] of Occupational  Health - Occupational Stress Questionnaire    Feeling of Stress : Only a little  Social Connections: Socially Isolated (06/15/2023)   Social Connection and Isolation Panel    Frequency of Communication with Friends and Family: More than three times a week    Frequency of Social Gatherings with Friends and Family: Twice a week    Attends Religious Services: Never    Database Administrator or Organizations: No    Attends Banker Meetings: Never    Marital Status: Widowed  Intimate Partner Violence: Not At Risk (06/15/2023)   Humiliation, Afraid, Rape, and Kick questionnaire    Fear of Current or Ex-Partner: No    Emotionally Abused: No    Physically Abused: No    Sexually Abused: No             Rolinda Rogue, MD 07/28/24 1451

## 2024-07-28 NOTE — ED Triage Notes (Signed)
 Pt c/o sinus/ pressure, nasal congestion. Started about a week ago. Denies fever.

## 2024-08-01 ENCOUNTER — Ambulatory Visit (INDEPENDENT_AMBULATORY_CARE_PROVIDER_SITE_OTHER): Admitting: Emergency Medicine

## 2024-08-01 VITALS — BP 134/66 | Ht 68.0 in | Wt 121.4 lb

## 2024-08-01 DIAGNOSIS — Z0000 Encounter for general adult medical examination without abnormal findings: Secondary | ICD-10-CM

## 2024-08-01 DIAGNOSIS — Z1231 Encounter for screening mammogram for malignant neoplasm of breast: Secondary | ICD-10-CM

## 2024-08-01 NOTE — Patient Instructions (Signed)
 Ms. Jurczyk,  Thank you for taking the time for your Medicare Wellness Visit. I appreciate your continued commitment to your health goals. Please review the care plan we discussed, and feel free to reach out if I can assist you further.  Please note that Annual Wellness Visits do not include a physical exam. Some assessments may be limited, especially if the visit was conducted virtually. If needed, we may recommend an in-person follow-up with your provider.  Ongoing Care Seeing your primary care provider every 3 to 6 months helps us  monitor your health and provide consistent, personalized care.   Referrals If a referral was made during today's visit and you haven't received any updates within two weeks, please contact the referred provider directly to check on the status.  Recommended Screenings:  Please call to schedule your mammogram and/or bone density scan:  [facility] at [facility] Address: [address], [place], [place] Phone: [phone]  [facility] at [facility] [address], [address] [place], [place] [address] Phone: [phone]  Health Maintenance  Topic Date Due   DTaP/Tdap/Td vaccine (1 - Tdap) Never done   Zoster (Shingles) Vaccine (1 of 2) Never done   COVID-19 Vaccine (3 - Moderna risk series) 04/21/2020   Medicare Annual Wellness Visit  06/14/2024   Flu Shot  12/11/2024*   Osteoporosis screening with Bone Density Scan  04/02/2025*   Breast Cancer Screening  12/04/2024   Pneumococcal Vaccine for age over 73  Completed   Meningitis B Vaccine  Aged Out  *Topic was postponed. The date shown is not the original due date.       08/01/2024    3:35 PM  Advanced Directives  Does Patient Have a Medical Advance Directive? Yes  Type of Estate Agent of [place];Living will  Does patient want to make changes to medical advance directive? No - Patient declined  Copy of Healthcare Power of Attorney  in Chart? Yes - validated most recent copy scanned in chart (See row information)    Vision: Annual vision screenings are recommended for early detection of glaucoma, cataracts, and diabetic retinopathy. These exams can also reveal signs of chronic conditions such as diabetes and high blood pressure.  Dental: Annual dental screenings help detect early signs of oral cancer, gum disease, and other conditions linked to overall health, including heart disease and diabetes.  Please see the attached documents for additional preventive care recommendations.   Fall Prevention in the Home, Adult Falls can cause injuries and affect people of all ages. There are many simple things that you can do to make your home safe and to help prevent falls. If you need it, ask for help making these changes. What actions can I take to prevent falls? General information Use good lighting in all rooms. Make sure to: Replace any light bulbs that burn out. Turn on lights if it is dark and use night-lights. Keep items that you use often in easy-to-reach places. Lower the shelves around your home if needed. Move furniture so that there are clear paths around it. Do not keep throw rugs or other things on the floor that can make you trip. If any of your floors are uneven, fix them. Add color or contrast paint or tape to clearly mark and help you see: Grab bars or handrails. First and last steps of staircases. Where the edge of each step is. If you use a ladder or stepladder: Make sure that it is fully  opened. Do not climb a closed ladder. Make sure the sides of the ladder are locked in place. Have someone hold the ladder while you use it. Know where your pets are as you move through your home. What can I do in the bathroom?     Keep the floor dry. Clean up any water that is on the floor right away. Remove soap buildup in the bathtub or shower. Buildup makes bathtubs and showers slippery. Use non-skid mats or  decals on the floor of the bathtub or shower. Attach bath mats securely with double-sided, non-slip rug tape. If you need to sit down while you are in the shower, use a non-slip stool. Install grab bars by the toilet and in the bathtub and shower. Do not use towel bars as grab bars. What can I do in the bedroom? Make sure that you have a light by your bed that is easy to reach. Do not use any sheets or blankets on your bed that hang to the floor. Have a firm bench or chair with side arms that you can use for support when you get dressed. What can I do in the kitchen? Clean up any spills right away. If you need to reach something above you, use a sturdy step stool that has a grab bar. Keep electrical cables out of the way. Do not use floor polish or wax that makes floors slippery. What can I do with my stairs? Do not leave anything on the stairs. Make sure that you have a light switch at the top and the bottom of the stairs. Have them installed if you do not have them. Make sure that there are handrails on both sides of the stairs. Fix handrails that are broken or loose. Make sure that handrails are as long as the staircases. Install non-slip stair treads on all stairs in your home if they do not have carpet. Avoid having throw rugs at the top or bottom of stairs, or secure the rugs with carpet tape to prevent them from moving. Choose a carpet design that does not hide the edge of steps on the stairs. Make sure that carpet is firmly attached to the stairs. Fix any carpet that is loose or worn. What can I do on the outside of my home? Use bright outdoor lighting. Repair the edges of walkways and driveways and fix any cracks. Clear paths of anything that can make you trip, such as tools or rocks. Add color or contrast paint or tape to clearly mark and help you see high doorway thresholds. Trim any bushes or trees on the main path into your home. Check that handrails are securely fastened and in  good repair. Both sides of all steps should have handrails. Install guardrails along the edges of any raised decks or porches. Have leaves, snow, and ice cleared regularly. Use sand, salt, or ice melt on walkways during winter months if you live where there is ice and snow. In the garage, clean up any spills right away, including grease or oil spills. What other actions can I take? Review your medicines with your health care provider. Some medicines can make you confused or feel dizzy. This can increase your chance of falling. Wear closed-toe shoes that fit well and support your feet. Wear shoes that have rubber soles and low heels. Use a cane, walker, scooter, or crutches that help you move around if needed. Talk with your provider about other ways that you can decrease your risk of falls.  This may include seeing a physical therapist to learn to do exercises to improve movement and strength. Where to find more information Centers for Disease Control and Prevention, [employer]: tonerpromos.no [employer] on Aging: baseringtones.pl National Institute on Aging: baseringtones.pl Contact a health care provider if: You are afraid of falling at home. You feel weak, drowsy, or dizzy at home. You fall at home. Get help right away if you: Lose consciousness or have trouble moving after a fall. Have a fall that causes a head injury. These symptoms may be an emergency. Get help right away. Call 911. Do not wait to see if the symptoms will go away. Do not drive yourself to the hospital. This information is not intended to replace advice given to you by your health care provider. Make sure you discuss any questions you have with your health care provider. Document Revised: 05/03/2022 Document Reviewed: 05/03/2022 Elsevier Patient Education  2024 [employer].

## 2024-08-01 NOTE — Progress Notes (Cosign Needed Addendum)
 Chief Complaint  Patient presents with   Medicare Wellness     Subjective:   Kendra Lane is a 89 y.o. female who presents for a Medicare Annual Wellness Visit.  Allergies (verified) Valsartan   History: Past Medical History:  Diagnosis Date   Dysrhythmia    hx of, ok now   GERD (gastroesophageal reflux disease)    Headache    twice a day sometimes   Hypertension    Neck pain    Shoulder pain, left    Past Surgical History:  Procedure Laterality Date   ABDOMINAL HYSTERECTOMY     CATARACT EXTRACTION W/PHACO Right 07/23/2020   Procedure: CATARACT EXTRACTION PHACO AND INTRAOCULAR LENS PLACEMENT (IOC) RIGHT 7.62 01:26.5 8.8%;  Surgeon: Mittie Gaskin, MD;  Location: [facility];  Service: Ophthalmology;  Laterality: Right;   CATARACT EXTRACTION W/PHACO Left 04/01/2021   Procedure: CATARACT EXTRACTION PHACO AND INTRAOCULAR LENS PLACEMENT (IOC) LEFT 3.64 00:56.9;  Surgeon: Mittie Gaskin, MD;  Location: [facility];  Service: Ophthalmology;  Laterality: Left;   ESOPHAGOGASTRODUODENOSCOPY (EGD) WITH ESOPHAGEAL DILATION  10/2014   Dr. Jinny   TOTAL VAGINAL HYSTERECTOMY  1993   TUBAL LIGATION     Family History  Problem Relation Age of Onset   Hypertension Mother    Other Father        old age   Breast cancer Daughter 9   Social History   Occupational History   Occupation: Retired  Tobacco Use   Smoking status: Never    Passive exposure: Never   Smokeless tobacco: Never   Tobacco comments:    smoking cessation materials not required  Vaping Use   Vaping status: Never Used  Substance and Sexual Activity   Alcohol use: Not Currently    Alcohol/week: 2.0 standard drinks of alcohol    Types: 2 Cans of beer per week    Comment: last use 08/2019   Drug use: No   Sexual activity: Not Currently   Tobacco Counseling Counseling given: Not Answered Tobacco comments: smoking cessation materials not required  SDOH Screenings   Food  Insecurity: No Food Insecurity (08/01/2024)  Housing: Low Risk  (08/01/2024)  Transportation Needs: No Transportation Needs (08/01/2024)  Utilities: Not At Risk (08/01/2024)  Alcohol Screen: Low Risk  (06/15/2023)  Depression (PHQ2-9): Low Risk  (08/01/2024)  Recent Concern: Depression (PHQ2-9) - Medium Risk (06/29/2024)  Financial Resource Strain: Low Risk  (06/15/2023)  Physical Activity: Insufficiently Active (08/01/2024)  Social Connections: Moderately Isolated (08/01/2024)  Stress: No Stress Concern Present (08/01/2024)  Tobacco Use: Low Risk  (08/01/2024)  Health Literacy: Adequate Health Literacy (08/01/2024)   See flowsheets for full screening details  Depression Screen PHQ 2 & 9 Depression Scale- Over the past 2 weeks, how often have you been bothered by any of the following problems? Little interest or pleasure in doing things: 0 Feeling down, depressed, or hopeless (PHQ Adolescent also includes...irritable): 1 PHQ-2 Total Score: 1 Trouble falling or staying asleep, or sleeping too much: 1 Feeling tired or having little energy: 1 Poor appetite or overeating (PHQ Adolescent also includes...weight loss): 0 Feeling bad about yourself - or that you are a failure or have let yourself or your family down: 0 Trouble concentrating on things, such as reading the newspaper or watching television (PHQ Adolescent also includes...like school work): 0 Moving or speaking so slowly that other people could have noticed. Or the opposite - being so fidgety or restless that you have been moving around a lot more  than usual: 0 Thoughts that you would be better off dead, or of hurting yourself in some way: 0 PHQ-9 Total Score: 3 If you checked off any problems, how difficult have these problems made it for you to do your work, take care of things at home, or get along with other people?: Not difficult at all  Depression Treatment Depression Interventions/Treatment : EYV7-0 Score <4 Follow-up Not  Indicated     Goals Addressed               This Visit's Progress     Maintain current health (pt-stated)         Visit info / Clinical Intake: Medicare Wellness Visit Type:: Subsequent Annual Wellness Visit Persons participating in visit:: patient Medicare Wellness Visit Mode:: In-person (required for WTM) Information given by:: patient Interpreter Needed?: No Pre-visit prep was completed: yes AWV questionnaire completed by patient prior to visit?: no Living arrangements:: with family/others Patient's Overall Health Status Rating: very good Typical amount of pain: some Does pain affect daily life?: no Are you currently prescribed opioids?: no  Dietary Habits and Nutritional Risks How many meals a day?: 2 Eats fruit and vegetables daily?: yes Most meals are obtained by: preparing own meals In the last 2 weeks, have you had any of the following?: none Diabetic:: no  Functional Status Activities of Daily Living (to include ambulation/medication): Independent Ambulation: Independent Medication Administration: Independent Home Management: Independent Manage your own finances?: yes Primary transportation is: driving Concerns about vision?: no *vision screening is required for WTM* Concerns about hearing?: no  Fall Screening Falls in the past year?: 0 Number of falls in past year: 0 Was there an injury with Fall?: 0 Fall Risk Category Calculator: 0 Patient Fall Risk Level: Low Fall Risk  Fall Risk Patient at Risk for Falls Due to: No Fall Risks Fall risk Follow up: Falls evaluation completed  Home and Transportation Safety: All rugs have non-skid backing?: yes All stairs or steps have railings?: yes Grab bars in the bathtub or shower?: (!) no Have non-skid surface in bathtub or shower?: yes Good home lighting?: yes Regular seat belt use?: yes Hospital stays in the last year:: no  Cognitive Assessment Difficulty concentrating, remembering, or making  decisions? : yes (sometimes forgets) Will 6CIT or Mini Cog be Completed: yes What year is it?: 0 points What month is it?: 0 points Give patient an address phrase to remember (5 components): [address] [place] About what time is it?: 0 points Count backwards from 20 to 1: 0 points Say the months of the year in reverse: 0 points Repeat the address phrase from earlier: 4 points 6 CIT Score: 4 points  Advance Directives (For Healthcare) Does Patient Have a Medical Advance Directive?: Yes Does patient want to make changes to medical advance directive?: No - Patient declined Type of Advance Directive: Healthcare Power of [place]; Living will Copy of Healthcare Power of Attorney in Chart?: Yes - validated most recent copy scanned in chart (See row information) Copy of Living Will in Chart?: Yes - validated most recent copy scanned in chart (See row information)  Reviewed/Updated  Reviewed/Updated: Reviewed All (Medical, Surgical, Family, Medications, Allergies, Care Teams, Patient Goals)        Objective:    Today's Vitals   08/01/24 1530  BP: 134/66  Weight: 121 lb 6.4 oz (55.1 kg)  Height: 5' 8 (1.727 m)   Body mass index is 18.46 kg/m.  Current Medications (verified) Outpatient Encounter Medications as  of 08/01/2024  Medication Sig   amLODipine  (NORVASC ) 10 MG tablet TAKE 1 TABLET BY MOUTH DAILY   amoxicillin  (AMOXIL ) 875 MG tablet Take 1 tablet (875 mg total) by mouth 2 (two) times daily for 7 days.   aspirin  81 MG tablet Take 81 mg by mouth daily.   calcium-vitamin D  (OSCAL WITH D) 500-200 MG-UNIT tablet Take 1 tablet by mouth. 600 mg daily   metoprolol  succinate (TOPROL -XL) 25 MG 24 hr tablet TAKE 3 TABLETS(75 MG) BY MOUTH DAILY   Multiple Vitamins-Minerals (CENTRUM SILVER 50+WOMEN PO) Take 1 tablet by mouth daily.   olmesartan  (BENICAR ) 40 MG tablet TAKE 1 TABLET(40 MG) BY MOUTH DAILY   omeprazole  (PRILOSEC) 20 MG capsule TAKE 1 CAPSULE(20 MG) BY MOUTH DAILY    spironolactone  (ALDACTONE ) 25 MG tablet TAKE 1 TABLET(25 MG) BY MOUTH DAILY   No facility-administered encounter medications on file as of 08/01/2024.   Hearing/Vision screen Hearing Screening - Comments:: Denies hearing loss  Vision Screening - Comments:: UTD @ [facility] [place] Immunizations and Health Maintenance Health Maintenance  Topic Date Due   DTaP/Tdap/Td (1 - Tdap) Never done   Zoster Vaccines- Shingrix (1 of 2) Never done   COVID-19 Vaccine (3 - Moderna risk series) 04/21/2020   Influenza Vaccine  12/11/2024 (Originally 04/13/2024)   Bone Density Scan  04/02/2025 (Originally 06/15/2020)   Mammogram  12/04/2024   Medicare Annual Wellness (AWV)  08/01/2025   Pneumococcal Vaccine: 50+ Years  Completed   Meningococcal B Vaccine  Aged Out        Assessment/Plan:  This is a routine wellness examination for Kendra Lane.  Patient Care Team: Justus Leita DEL, MD as PCP - General (Internal Medicine) Ashley Soulier, DPM as Referring Physician (Podiatry) Francisca Redell BROCKS, MD as Consulting Physician (Urology) [facility], [facility])  I have personally reviewed and noted the following in the patient's chart:   Medical and social history Use of alcohol, tobacco or illicit drugs  Current medications and supplements including opioid prescriptions. Functional ability and status Nutritional status Physical activity Advanced directives List of other physicians Hospitalizations, surgeries, and ER visits in previous 12 months Vitals Screenings to include cognitive, depression, and falls Referrals and appointments  Orders Placed This Encounter  Procedures   MM 3D SCREENING MAMMOGRAM BILATERAL BREAST    Standing Status:   Future    Expected Date:   12/05/2024    Expiration Date:   08/01/2025    Reason for Exam (SYMPTOM  OR DIAGNOSIS REQUIRED):   screening for breast cancer    Preferred imaging location?:   [facility]   In addition, I have reviewed and  discussed with patient certain preventive protocols, quality metrics, and best practice recommendations. A written personalized care plan for preventive services as well as general preventive health recommendations were provided to patient.   Vina Ned, CMA   08/01/2024   Return in 13 months (on 08/22/2025).  After Visit Summary: (In Person-Printed) AVS printed and given to the patient  Nurse Notes:  6 CIT Score - 4 Placed order for [facility] (due ~ 12/05/24) Declined flu, covid, shingles and Tdap vaccines Declined DEXA scan Screening colonoscopy no longer recommended due to age.

## 2024-08-24 ENCOUNTER — Ambulatory Visit

## 2024-09-03 ENCOUNTER — Ambulatory Visit: Admitting: Internal Medicine

## 2024-09-03 ENCOUNTER — Encounter: Payer: Self-pay | Admitting: Internal Medicine

## 2024-09-03 VITALS — BP 122/78 | HR 76 | Ht 66.0 in | Wt 120.0 lb

## 2024-09-03 DIAGNOSIS — M19011 Primary osteoarthritis, right shoulder: Secondary | ICD-10-CM | POA: Diagnosis not present

## 2024-09-03 DIAGNOSIS — I10 Essential (primary) hypertension: Secondary | ICD-10-CM | POA: Diagnosis not present

## 2024-09-03 DIAGNOSIS — H6123 Impacted cerumen, bilateral: Secondary | ICD-10-CM

## 2024-09-03 NOTE — Assessment & Plan Note (Signed)
 No weakness or sensory deficit on exam no joint pain or decreased ROM No clear cause for symptoms - pt reassured

## 2024-09-03 NOTE — Progress Notes (Signed)
 "   Date:  09/03/2024   Name:  Kendra Lane   DOB:  11-01-1934   MRN:  [id number]   Chief Complaint: Follow-up (HTN-f/u)  Hypertension This is a chronic problem. The problem is controlled. Pertinent negatives include no chest pain, headaches, palpitations or shortness of breath. Past treatments include angiotensin blockers, beta blockers, calcium channel blockers and diuretics. The current treatment provides significant improvement.  Ear Fullness  There is pain in both ears. This is a recurrent problem. There has been no fever. Pertinent negatives include no abdominal pain, coughing, diarrhea or headaches. She has tried nothing for the symptoms.  Shoulder Pain  The pain is present in the right shoulder. This is a recurrent problem. The quality of the pain is described as aching. The pain is at a severity of 3/10. The symptoms are aggravated by lying down. She has tried nothing for the symptoms.    Review of Systems  Constitutional:  Negative for fatigue and unexpected weight change.  HENT:  Negative for trouble swallowing.   Eyes:  Negative for visual disturbance.  Respiratory:  Negative for cough, chest tightness, shortness of breath and wheezing.   Cardiovascular:  Negative for chest pain, palpitations and leg swelling.  Gastrointestinal:  Negative for abdominal pain, constipation and diarrhea.  Musculoskeletal:  Positive for arthralgias. Negative for myalgias.  Neurological:  Negative for dizziness, weakness, light-headedness and headaches.  Psychiatric/Behavioral:  Positive for sleep disturbance. Negative for dysphoric mood. The patient is not nervous/anxious.      Lab Results  Component Value Date   NA 140 04/02/2024   K 4.3 04/02/2024   CO2 28 04/02/2024   GLUCOSE 101 (H) 04/02/2024   BUN 14 04/02/2024   CREATININE 0.79 04/02/2024   CALCIUM 9.6 04/02/2024   EGFR 83 07/18/2022   GFRNONAA >60 04/02/2024   Lab Results  Component Value Date   CHOL 232 (H) 04/02/2024    HDL 85 04/02/2024   LDLCALC 133 (H) 04/02/2024   TRIG 68 04/02/2024   CHOLHDL 2.7 04/02/2024   Lab Results  Component Value Date   TSH 0.815 04/02/2024   Lab Results  Component Value Date   HGBA1C 5.5 04/02/2024   Lab Results  Component Value Date   WBC 5.9 04/02/2024   HGB 11.8 (L) 04/02/2024   HCT 35.5 (L) 04/02/2024   MCV 91.5 04/02/2024   PLT 297 04/02/2024   Lab Results  Component Value Date   ALT 16 04/02/2024   AST 20 04/02/2024   ALKPHOS 34 (L) 04/02/2024   BILITOT 0.7 04/02/2024   Lab Results  Component Value Date   VD25OH 36.4 06/16/2021     Patient Active Problem List   Diagnosis Date Noted   Primary insomnia 06/29/2024   Mild anxiety 06/29/2024   Complaints of leg weakness 06/01/2023   Mixed hyperlipidemia 09/16/2022   Prediabetes 09/16/2022   Asymptomatic microscopic hematuria 09/16/2022   Age-related osteoporosis without current pathological fracture 06/12/2020   Aortic atherosclerosis 04/02/2020   Arthritis of shoulder 11/30/2017   Bigeminy 04/07/2016   Benign esophageal stricture 01/21/2015   Essential (primary) hypertension 01/21/2015   Hammer toe 01/21/2015   Lipoma of skin and subcutaneous tissue of trunk 01/21/2015    Allergies[1]  Past Surgical History:  Procedure Laterality Date   ABDOMINAL HYSTERECTOMY     CATARACT EXTRACTION W/PHACO Right 07/23/2020   Procedure: CATARACT EXTRACTION PHACO AND INTRAOCULAR LENS PLACEMENT (IOC) RIGHT 7.62 01:26.5 8.8%;  Surgeon: Mittie Gaskin, MD;  Location: [facility];  Service: Ophthalmology;  Laterality: Right;   CATARACT EXTRACTION W/PHACO Left 04/01/2021   Procedure: CATARACT EXTRACTION PHACO AND INTRAOCULAR LENS PLACEMENT (IOC) LEFT 3.64 00:56.9;  Surgeon: Mittie Gaskin, MD;  Location: [facility];  Service: Ophthalmology;  Laterality: Left;   ESOPHAGOGASTRODUODENOSCOPY (EGD) WITH ESOPHAGEAL DILATION  10/2014   Dr. Jinny   TOTAL VAGINAL HYSTERECTOMY  1993   TUBAL  LIGATION      Social History[2]   Medication list has been reviewed and updated.  Active Medications[3]     09/03/2024   11:22 AM 06/29/2024    2:13 PM 04/02/2024    9:16 AM 11/29/2023    9:54 AM  GAD 7 : Generalized Anxiety Score  Nervous, Anxious, on Edge 2 1 3 2   Control/stop worrying 2 1 3 2   Worry too much - different things 3 1 3 2   Trouble relaxing 1 1 3  0  Restless 2 1 3 1   Easily annoyed or irritable 0 1 3 2   Afraid - awful might happen 0 1 3 0  Total GAD 7 Score 10 7 21 9   Anxiety Difficulty Not difficult at all Not difficult at all Extremely difficult Not difficult at all       09/03/2024   11:22 AM 08/01/2024    3:43 PM 06/29/2024    2:13 PM  Depression screen PHQ 2/9  Decreased Interest 2 0 1  Down, Depressed, Hopeless 3 1 1   PHQ - 2 Score 5 1 2   Altered sleeping 2 1 1   Tired, decreased energy 0 1 1  Change in appetite 0 0 0  Feeling bad or failure about yourself  1 0 0  Trouble concentrating 2 0 1  Moving slowly or fidgety/restless 1 0 0  Suicidal thoughts 0 0 0  PHQ-9 Score 11 3 5    Difficult doing work/chores  Not difficult at all Not difficult at all     Data saved with a previous flowsheet row definition    BP Readings from Last 3 Encounters:  09/03/24 122/78  08/01/24 134/66  07/28/24 (!) 169/75    Physical Exam Vitals and nursing note reviewed.  Constitutional:      General: She is not in acute distress.    Appearance: Normal appearance. She is well-developed.  HENT:     Head: Normocephalic and atraumatic.     Right Ear: There is impacted cerumen.     Left Ear: There is impacted cerumen.  Neck:     Vascular: No carotid bruit.  Cardiovascular:     Rate and Rhythm: Normal rate and regular rhythm.  Pulmonary:     Effort: Pulmonary effort is normal. No respiratory distress.     Breath sounds: No wheezing or rhonchi.  Musculoskeletal:     Right shoulder: Tenderness present. No swelling or deformity. Normal range of motion.      Cervical back: Normal range of motion.     Right lower leg: No edema.     Left lower leg: No edema.  Lymphadenopathy:     Cervical: No cervical adenopathy.  Skin:    General: Skin is warm and dry.     Findings: No rash.  Neurological:     Mental Status: She is alert and oriented to person, place, and time.  Psychiatric:        Mood and Affect: Mood normal.        Behavior: Behavior normal.     Wt Readings from Last 3 Encounters:  09/03/24 120 lb (54.4 kg)  08/01/24 121 lb 6.4 oz (55.1 kg)  07/28/24 119 lb 0.8 oz (54 kg)    BP 122/78   Pulse 76   Ht 5' 6 (1.676 m)   Wt 120 lb (54.4 kg)   SpO2 97%   BMI 19.37 kg/m   Assessment and Plan:  Problem List Items Addressed This Visit       Unprioritized   Essential (primary) hypertension - Primary (Chronic)   Well controlled blood pressure today. Current regimen is amlodipine , olmesartan , spironolactone  and metoprolol . No medication side effects noted.  HR and renal function are normal.        Arthritis of shoulder   Recommend topical rubs and Tylenol  at bedtime since sleep is the most impacted      Other Visit Diagnoses       Bilateral impacted cerumen       recommend ENT for removal       No follow-ups on file.    Leita HILARIO Adie, MD [facility] [place]           [1]  Allergies Allergen Reactions   Valsartan Other (See Comments)    dizziness  [2]  Social History Tobacco Use   Smoking status: Never    Passive exposure: Never   Smokeless tobacco: Never   Tobacco comments:    smoking cessation materials not required  Vaping Use   Vaping status: Never Used  Substance Use Topics   Alcohol use: Not Currently    Alcohol/week: 2.0 standard drinks of alcohol    Types: 2 Cans of beer per week    Comment: last use 08/2019   Drug use: No  [3]  Current Meds  Medication Sig   amLODipine  (NORVASC ) 10 MG tablet TAKE 1 TABLET BY MOUTH DAILY   aspirin  81 MG  tablet Take 81 mg by mouth daily.   calcium-vitamin D  (OSCAL WITH D) 500-200 MG-UNIT tablet Take 1 tablet by mouth. 600 mg daily   metoprolol  succinate (TOPROL -XL) 25 MG 24 hr tablet TAKE 3 TABLETS(75 MG) BY MOUTH DAILY   Multiple Vitamins-Minerals (CENTRUM SILVER 50+WOMEN PO) Take 1 tablet by mouth daily.   olmesartan  (BENICAR ) 40 MG tablet TAKE 1 TABLET(40 MG) BY MOUTH DAILY   omeprazole  (PRILOSEC) 20 MG capsule TAKE 1 CAPSULE(20 MG) BY MOUTH DAILY   spironolactone  (ALDACTONE ) 25 MG tablet TAKE 1 TABLET(25 MG) BY MOUTH DAILY   "

## 2024-09-03 NOTE — Assessment & Plan Note (Signed)
 Recommend topical rubs and Tylenol  at bedtime since sleep is the most impacted

## 2024-09-03 NOTE — Assessment & Plan Note (Signed)
 with mild anxiety - would recommend Sleepy time tea qhs to help with sleep may help reduce daytime anxiety and sense of weakness

## 2024-09-03 NOTE — Assessment & Plan Note (Signed)
 Well controlled blood pressure today. Current regimen is amlodipine , olmesartan , spironolactone  and metoprolol . No medication side effects noted.  HR and renal function are normal.

## 2024-09-03 NOTE — Assessment & Plan Note (Signed)
 hopefully with improve with better sleep if no improvement or worsening - follow up for consideration of low dose Sertraline

## 2024-09-24 ENCOUNTER — Ambulatory Visit
Admission: EM | Admit: 2024-09-24 | Discharge: 2024-09-24 | Disposition: A | Discharge status: Home / Self Care | Attending: Emergency Medicine | Admitting: Emergency Medicine

## 2024-09-24 DIAGNOSIS — H0015 Chalazion left lower eyelid: Secondary | ICD-10-CM

## 2024-09-24 MED ORDER — ERYTHROMYCIN 5 MG/GM OP OINT: TOPICAL_OINTMENT | OPHTHALMIC | 0 refills | Status: AC

## 2024-09-24 NOTE — Discharge Instructions (Signed)
 Start doing warm compresses 4 times a day. You may get some baby shampoo, dilute this with warm water, and gently wipe your upper and lower eyelid while you are taking a shower. This will help prevent the recurrence of any styes or chalazions. Follow up with ophthalmologist if this does not get better within a week, sooner if it gets worse.  Try not to rub your eyes. You may use refrigerated Systane as often as you want for comfort. Return immediately to the ER for fever above 100.4, if you have pain moving your eyes, any visual changes, nausea, vomiting, headaches, a rash around your eye, or any other concerns.  Go to www.goodrx.com  or www.costplusdrugs.com to look up your medications. This will give you a list of where you can find your prescriptions at the most affordable prices. Or ask the pharmacist what the cash price is, or if they have any other discount programs available to help make your medication more affordable. This can be less expensive than what you would pay with insurance.

## 2024-09-24 NOTE — ED Provider Notes (Signed)
 HPI  SUBJECTIVE:  Kendra Lane is a 89 y.o. female who presents with warm body sensation in her left medial lower eyelid with a sensation of itching and dry eye starting 5 days ago.  She reports some sniffles, but denies eye pain, conjunctival injection, purulent drainage, increased tearing, visual changes, fevers, headaches, allergy symptoms, photophobia, pain with EOMs, periorbital erythema, edema tenderness.  She does not recall any trauma to the eye.  She does not wear contacts or glasses.  She tried applying an ice pack with improvement in her symptoms.  No aggravating factors.  She has a past medical history of GERD, hypertension, bigeminy resolved, bilateral cataract surgery.  [facility] [place] [facility]  Past Medical History:  Diagnosis Date   Dysrhythmia    hx of, ok now   GERD (gastroesophageal reflux disease)    Headache    twice a day sometimes   Hypertension    Neck pain    Shoulder pain, left     Past Surgical History:  Procedure Laterality Date   ABDOMINAL HYSTERECTOMY     CATARACT EXTRACTION W/PHACO Right 07/23/2020   Procedure: CATARACT EXTRACTION PHACO AND INTRAOCULAR LENS PLACEMENT (IOC) RIGHT 7.62 01:26.5 8.8%;  Surgeon: Mittie Gaskin, MD;  Location: [facility];  Service: Ophthalmology;  Laterality: Right;   CATARACT EXTRACTION W/PHACO Left 04/01/2021   Procedure: CATARACT EXTRACTION PHACO AND INTRAOCULAR LENS PLACEMENT (IOC) LEFT 3.64 00:56.9;  Surgeon: Mittie Gaskin, MD;  Location: [facility];  Service: Ophthalmology;  Laterality: Left;   ESOPHAGOGASTRODUODENOSCOPY (EGD) WITH ESOPHAGEAL DILATION  10/2014   Dr. Jinny   TOTAL VAGINAL HYSTERECTOMY  1993   TUBAL LIGATION      Family History  Problem Relation Age of Onset   Hypertension Mother    Other Father        old age   Breast cancer Daughter 3    Social History[1]  Current Medications[2]  Allergies[3]   ROS  As noted in HPI.   Physical Exam  BP (!)  145/72 (BP Location: Left Arm)   Pulse 84   Temp 98 F (36.7 C) (Oral)   Resp 17   Wt 56.2 kg   SpO2 100%   BMI 19.98 kg/m   Constitutional: Well developed, well nourished, no acute distress Eyes:  EOMI, conjunctiva normal bilaterally PERRLA.  Left eye: No direct or consensual photophobia.  No purulent drainage, increased tearing.  Positive small incoming chalazion left lower medial eyelid.  No periorbital erythema, edema, tenderness.  No corneal abrasion, hyphema.   Visual Acuity  Right Eye Distance: 20/50 Left Eye Distance: 20/25 Bilateral Distance:    Right Eye Near:   Left Eye Near:    Bilateral Near:     HENT: Normocephalic, atraumatic,mucus membranes moist Respiratory: Normal inspiratory effort Cardiovascular: Normal rate GI: nondistended skin: No rash, skin intact Musculoskeletal: no deformities Neurologic: Alert & oriented x 3, no focal neuro deficits Psychiatric: Speech and behavior appropriate   ED Course   Medications - No data to display  No orders of the defined types were placed in this encounter.   No results found for this or any previous visit (from the past 24 hours). No results found.  ED Clinical Impression  1. Chalazion of left lower eyelid      ED Assessment/Plan     Patient presents with an incoming chalazion left medial lower eyelid.  Home with warm compresses, refrigerated Systane, erythromycin  ointment 4 times daily, and lid hygiene.  Follow-up with her ophthalmologist at [facility]  eye if not getting better in a week, or if she gets acutely worse.  Discussed  MDM, treatment plan, and plan for follow-up with patient.  patient agrees with plan.   Meds ordered this encounter  Medications   erythromycin  ophthalmic ointment    Sig: 1 cm ribbon to affected eyelid qid x 10 days    Dispense:  5 g    Refill:  0      *This clinic note was created using Scientist, clinical (histocompatibility and immunogenetics). Therefore, there may be occasional mistakes despite  careful proofreading.  ?     [1]  Social History Tobacco Use   Smoking status: Never    Passive exposure: Never   Smokeless tobacco: Never   Tobacco comments:    smoking cessation materials not required  Vaping Use   Vaping status: Never Used  Substance Use Topics   Alcohol use: Not Currently    Alcohol/week: 2.0 standard drinks of alcohol    Types: 2 Cans of beer per week    Comment: last use 08/2019   Drug use: No  [2] No current facility-administered medications for this encounter.  Current Outpatient Medications:    erythromycin  ophthalmic ointment, 1 cm ribbon to affected eyelid qid x 10 days, Disp: 5 g, Rfl: 0   amLODipine  (NORVASC ) 10 MG tablet, TAKE 1 TABLET BY MOUTH DAILY, Disp: 90 tablet, Rfl: 3   aspirin  81 MG tablet, Take 81 mg by mouth daily., Disp: , Rfl:    calcium-vitamin D  (OSCAL WITH D) 500-200 MG-UNIT tablet, Take 1 tablet by mouth. 600 mg daily, Disp: , Rfl:    metoprolol  succinate (TOPROL -XL) 25 MG 24 hr tablet, TAKE 3 TABLETS(75 MG) BY MOUTH DAILY, Disp: 270 tablet, Rfl: 0   Multiple Vitamins-Minerals (CENTRUM SILVER 50+WOMEN PO), Take 1 tablet by mouth daily., Disp: , Rfl:    olmesartan  (BENICAR ) 40 MG tablet, TAKE 1 TABLET(40 MG) BY MOUTH DAILY, Disp: 90 tablet, Rfl: 1   omeprazole  (PRILOSEC) 20 MG capsule, TAKE 1 CAPSULE(20 MG) BY MOUTH DAILY, Disp: 90 capsule, Rfl: 3   spironolactone  (ALDACTONE ) 25 MG tablet, TAKE 1 TABLET(25 MG) BY MOUTH DAILY, Disp: 90 tablet, Rfl: 1 [3]  Allergies Allergen Reactions   Valsartan Other (See Comments)    dizziness     Van Knee, MD 09/24/24 1157

## 2024-09-24 NOTE — ED Triage Notes (Signed)
 Pt present left eye irration, symptoms started last Thursday, pt state it feels like trash is in her left eye

## 2025-02-19 ENCOUNTER — Ambulatory Visit: Admitting: Urology

## 2025-04-03 ENCOUNTER — Encounter: Admitting: Student

## 2025-04-08 ENCOUNTER — Encounter: Admitting: Student

## 2025-08-22 ENCOUNTER — Ambulatory Visit
